# Patient Record
Sex: Female | Born: 1935 | Race: White | Hispanic: No | Smoking: Never smoker
Health system: Southern US, Community
[De-identification: ages and names within clinical notes are randomized; demographics above are authoritative.]

## PROBLEM LIST (undated history)

## (undated) DIAGNOSIS — K859 Acute pancreatitis without necrosis or infection, unspecified: Secondary | ICD-10-CM

## (undated) DIAGNOSIS — G479 Sleep disorder, unspecified: Secondary | ICD-10-CM

## (undated) DIAGNOSIS — K5790 Diverticulosis of intestine, part unspecified, without perforation or abscess without bleeding: Secondary | ICD-10-CM

## (undated) DIAGNOSIS — M549 Dorsalgia, unspecified: Secondary | ICD-10-CM

## (undated) DIAGNOSIS — D509 Iron deficiency anemia, unspecified: Secondary | ICD-10-CM

## (undated) DIAGNOSIS — G8929 Other chronic pain: Secondary | ICD-10-CM

## (undated) DIAGNOSIS — R51 Headache: Secondary | ICD-10-CM

## (undated) DIAGNOSIS — I10 Essential (primary) hypertension: Secondary | ICD-10-CM

## (undated) DIAGNOSIS — F419 Anxiety disorder, unspecified: Secondary | ICD-10-CM

## (undated) DIAGNOSIS — F329 Major depressive disorder, single episode, unspecified: Secondary | ICD-10-CM

## (undated) DIAGNOSIS — E785 Hyperlipidemia, unspecified: Secondary | ICD-10-CM

## (undated) DIAGNOSIS — G4700 Insomnia, unspecified: Secondary | ICD-10-CM

## (undated) DIAGNOSIS — R079 Chest pain, unspecified: Secondary | ICD-10-CM

## (undated) DIAGNOSIS — K219 Gastro-esophageal reflux disease without esophagitis: Secondary | ICD-10-CM

## (undated) DIAGNOSIS — F32A Depression, unspecified: Secondary | ICD-10-CM

## (undated) DIAGNOSIS — Z8619 Personal history of other infectious and parasitic diseases: Secondary | ICD-10-CM

## (undated) DIAGNOSIS — IMO0002 Reserved for concepts with insufficient information to code with codable children: Secondary | ICD-10-CM

## (undated) DIAGNOSIS — M5126 Other intervertebral disc displacement, lumbar region: Secondary | ICD-10-CM

## (undated) DIAGNOSIS — Z8719 Personal history of other diseases of the digestive system: Secondary | ICD-10-CM

## (undated) DIAGNOSIS — D649 Anemia, unspecified: Secondary | ICD-10-CM

## (undated) DIAGNOSIS — E119 Type 2 diabetes mellitus without complications: Secondary | ICD-10-CM

## (undated) DIAGNOSIS — M109 Gout, unspecified: Secondary | ICD-10-CM

## (undated) DIAGNOSIS — R519 Headache, unspecified: Secondary | ICD-10-CM

## (undated) DIAGNOSIS — R197 Diarrhea, unspecified: Secondary | ICD-10-CM

## (undated) DIAGNOSIS — K529 Noninfective gastroenteritis and colitis, unspecified: Secondary | ICD-10-CM

## (undated) HISTORY — DX: Sleep disorder, unspecified: G47.9

## (undated) HISTORY — DX: Essential (primary) hypertension: I10

## (undated) HISTORY — DX: Personal history of other diseases of the digestive system: Z87.19

## (undated) HISTORY — DX: Noninfective gastroenteritis and colitis, unspecified: K52.9

## (undated) HISTORY — DX: Type 2 diabetes mellitus without complications: E11.9

## (undated) HISTORY — DX: Personal history of other infectious and parasitic diseases: Z86.19

## (undated) HISTORY — PX: HERNIA REPAIR: SHX51

## (undated) HISTORY — DX: Anemia, unspecified: D64.9

## (undated) HISTORY — DX: Gout, unspecified: M10.9

## (undated) HISTORY — DX: Headache, unspecified: R51.9

## (undated) HISTORY — DX: Anxiety disorder, unspecified: F41.9

## (undated) HISTORY — DX: Diarrhea, unspecified: R19.7

## (undated) HISTORY — DX: Other chronic pain: G89.29

## (undated) HISTORY — PX: APPENDECTOMY: SHX54

## (undated) HISTORY — DX: Iron deficiency anemia, unspecified: D50.9

## (undated) HISTORY — DX: Other intervertebral disc displacement, lumbar region: M51.26

## (undated) HISTORY — DX: Dorsalgia, unspecified: M54.9

## (undated) HISTORY — PX: CHOLECYSTECTOMY: SHX55

## (undated) HISTORY — DX: Insomnia, unspecified: G47.00

## (undated) HISTORY — DX: Acute pancreatitis without necrosis or infection, unspecified: K85.90

## (undated) HISTORY — PX: TOTAL ABDOMINAL HYSTERECTOMY W/ BILATERAL SALPINGOOPHORECTOMY: SHX83

## (undated) HISTORY — PX: COLONOSCOPY: SHX174

## (undated) HISTORY — DX: Headache: R51

## (undated) HISTORY — DX: Depression, unspecified: F32.A

## (undated) HISTORY — PX: CARDIAC CATHETERIZATION: SHX172

## (undated) HISTORY — DX: Hyperlipidemia, unspecified: E78.5

## (undated) HISTORY — PX: EXPLORATORY LAPAROTOMY: SUR591

## (undated) HISTORY — DX: Gastro-esophageal reflux disease without esophagitis: K21.9

## (undated) HISTORY — DX: Major depressive disorder, single episode, unspecified: F32.9

## (undated) HISTORY — DX: Diverticulosis of intestine, part unspecified, without perforation or abscess without bleeding: K57.90

## (undated) HISTORY — DX: Chest pain, unspecified: R07.9

## (undated) HISTORY — DX: Reserved for concepts with insufficient information to code with codable children: IMO0002

---

## 1975-10-26 HISTORY — PX: KNEE ARTHROSCOPY: SUR90

## 1993-02-06 HISTORY — PX: FLEXIBLE SIGMOIDOSCOPY: SHX1649

## 1996-10-25 HISTORY — PX: ROTATOR CUFF REPAIR: SHX139

## 2005-05-04 ENCOUNTER — Ambulatory Visit: Payer: Self-pay | Admitting: Internal Medicine

## 2005-05-26 ENCOUNTER — Emergency Department: Payer: Self-pay | Admitting: Emergency Medicine

## 2005-05-26 ENCOUNTER — Other Ambulatory Visit: Payer: Self-pay

## 2006-06-16 ENCOUNTER — Ambulatory Visit: Payer: Self-pay | Admitting: Internal Medicine

## 2007-06-20 ENCOUNTER — Ambulatory Visit: Payer: Self-pay | Admitting: Internal Medicine

## 2008-02-19 ENCOUNTER — Other Ambulatory Visit: Payer: Self-pay

## 2008-02-20 ENCOUNTER — Inpatient Hospital Stay: Payer: Self-pay | Admitting: Specialist

## 2008-06-24 ENCOUNTER — Ambulatory Visit: Payer: Self-pay | Admitting: Internal Medicine

## 2009-08-07 ENCOUNTER — Ambulatory Visit: Payer: Self-pay | Admitting: Internal Medicine

## 2009-12-23 ENCOUNTER — Ambulatory Visit: Payer: Self-pay | Admitting: Internal Medicine

## 2009-12-28 ENCOUNTER — Inpatient Hospital Stay: Payer: Self-pay | Admitting: Specialist

## 2010-01-13 ENCOUNTER — Ambulatory Visit: Payer: Self-pay | Admitting: Internal Medicine

## 2010-01-21 ENCOUNTER — Ambulatory Visit: Payer: Self-pay | Admitting: Vascular Surgery

## 2010-01-23 ENCOUNTER — Ambulatory Visit: Payer: Self-pay | Admitting: Internal Medicine

## 2010-02-22 ENCOUNTER — Ambulatory Visit: Payer: Self-pay | Admitting: Internal Medicine

## 2010-03-25 ENCOUNTER — Ambulatory Visit: Payer: Self-pay | Admitting: Internal Medicine

## 2010-04-24 ENCOUNTER — Ambulatory Visit: Payer: Self-pay | Admitting: Internal Medicine

## 2010-05-25 ENCOUNTER — Ambulatory Visit: Payer: Self-pay | Admitting: Internal Medicine

## 2010-06-25 ENCOUNTER — Ambulatory Visit: Payer: Self-pay | Admitting: Internal Medicine

## 2010-07-25 ENCOUNTER — Ambulatory Visit: Payer: Self-pay | Admitting: Internal Medicine

## 2010-08-25 ENCOUNTER — Ambulatory Visit: Payer: Self-pay | Admitting: Internal Medicine

## 2010-09-24 ENCOUNTER — Ambulatory Visit: Payer: Self-pay | Admitting: Internal Medicine

## 2010-09-25 ENCOUNTER — Emergency Department: Payer: Self-pay | Admitting: Unknown Physician Specialty

## 2010-09-26 ENCOUNTER — Ambulatory Visit: Payer: Self-pay | Admitting: Internal Medicine

## 2010-10-25 ENCOUNTER — Ambulatory Visit: Payer: Self-pay | Admitting: Internal Medicine

## 2010-11-25 ENCOUNTER — Ambulatory Visit: Payer: Self-pay | Admitting: Internal Medicine

## 2010-12-30 ENCOUNTER — Ambulatory Visit: Payer: Self-pay | Admitting: Internal Medicine

## 2011-01-24 ENCOUNTER — Ambulatory Visit: Payer: Self-pay | Admitting: Internal Medicine

## 2011-02-23 ENCOUNTER — Ambulatory Visit: Payer: Self-pay | Admitting: Internal Medicine

## 2011-03-26 ENCOUNTER — Ambulatory Visit: Payer: Self-pay | Admitting: Internal Medicine

## 2011-05-05 ENCOUNTER — Ambulatory Visit: Payer: Self-pay | Admitting: Internal Medicine

## 2011-05-31 ENCOUNTER — Ambulatory Visit: Payer: Self-pay | Admitting: Internal Medicine

## 2011-06-16 ENCOUNTER — Ambulatory Visit: Payer: Self-pay | Admitting: Internal Medicine

## 2011-06-26 ENCOUNTER — Ambulatory Visit: Payer: Self-pay | Admitting: Internal Medicine

## 2011-09-10 ENCOUNTER — Ambulatory Visit: Payer: Self-pay | Admitting: Internal Medicine

## 2011-09-22 ENCOUNTER — Inpatient Hospital Stay: Payer: Self-pay | Admitting: Internal Medicine

## 2011-09-25 ENCOUNTER — Ambulatory Visit: Payer: Self-pay | Admitting: Internal Medicine

## 2011-11-10 ENCOUNTER — Ambulatory Visit: Payer: Self-pay | Admitting: Internal Medicine

## 2011-11-26 ENCOUNTER — Ambulatory Visit: Payer: Self-pay | Admitting: Internal Medicine

## 2012-01-04 ENCOUNTER — Ambulatory Visit: Payer: Self-pay | Admitting: Internal Medicine

## 2012-01-24 ENCOUNTER — Ambulatory Visit: Payer: Self-pay | Admitting: Internal Medicine

## 2012-01-25 ENCOUNTER — Ambulatory Visit: Payer: Self-pay | Admitting: Internal Medicine

## 2012-01-25 LAB — COMPREHENSIVE METABOLIC PANEL
Anion Gap: 8 (ref 7–16)
Calcium, Total: 9.3 mg/dL (ref 8.5–10.1)
Chloride: 89 mmol/L — ABNORMAL LOW (ref 98–107)
Co2: 31 mmol/L (ref 21–32)
EGFR (African American): >60
EGFR (Non-African Amer.): 53 — ABNORMAL LOW
Glucose: 132 mg/dL — ABNORMAL HIGH (ref 65–99)
Osmolality: 259 (ref 275–301)
SGOT(AST): 27 U/L (ref 15–37)
SGPT (ALT): 25 U/L
Total Protein: 8.2 g/dL (ref 6.4–8.2)

## 2012-01-25 LAB — CBC CANCER CENTER
HGB: 12.2 g/dL (ref 12.0–16.0)
Lymphocyte %: 22.8 %
MCHC: 34.1 g/dL (ref 32.0–36.0)
MCV: 88 fL (ref 80–100)
Monocyte #: 1.1 x10 3/mm — ABNORMAL HIGH (ref 0.0–0.7)

## 2012-01-25 LAB — HEMOGLOBIN A1C: Hemoglobin A1C: 7.5 % — ABNORMAL HIGH (ref 4.2–6.3)

## 2012-02-11 LAB — IRON AND TIBC
Iron Bind.Cap.(Total): 246 ug/dL — ABNORMAL LOW (ref 250–450)
Iron Saturation: 20 %

## 2012-02-11 LAB — FERRITIN: Ferritin (ARMC): 516 ng/mL — ABNORMAL HIGH (ref 8–388)

## 2012-02-23 ENCOUNTER — Ambulatory Visit: Payer: Self-pay | Admitting: Internal Medicine

## 2012-02-25 ENCOUNTER — Inpatient Hospital Stay: Payer: Self-pay | Admitting: Internal Medicine

## 2012-02-25 LAB — COMPREHENSIVE METABOLIC PANEL
Alkaline Phosphatase: 52 U/L (ref 50–136)
Anion Gap: 9 (ref 7–16)
BUN: 18 mg/dL (ref 7–18)
Bilirubin,Total: 0.2 mg/dL (ref 0.2–1.0)
Calcium, Total: 9 mg/dL (ref 8.5–10.1)
Chloride: 88 mmol/L — ABNORMAL LOW (ref 98–107)
Co2: 29 mmol/L (ref 21–32)
EGFR (African American): >60
Glucose: 158 mg/dL — ABNORMAL HIGH (ref 65–99)
Osmolality: 259 (ref 275–301)
SGPT (ALT): 22 U/L
Total Protein: 8 g/dL (ref 6.4–8.2)

## 2012-02-25 LAB — CBC WITH DIFFERENTIAL/PLATELET
Basophil #: 0 10*3/uL (ref 0.0–0.1)
HCT: 33.2 % — ABNORMAL LOW (ref 35.0–47.0)
HGB: 11.1 g/dL — ABNORMAL LOW (ref 12.0–16.0)
Lymphocyte %: 24.1 %
MCH: 28.8 pg (ref 26.0–34.0)
MCV: 86 fL (ref 80–100)
Neutrophil #: 9.3 10*3/uL — ABNORMAL HIGH (ref 1.4–6.5)
Platelet: 342 10*3/uL (ref 150–440)
RBC: 3.85 10*6/uL (ref 3.80–5.20)
RDW: 12.5 % (ref 11.5–14.5)

## 2012-02-25 LAB — PROTIME-INR: Prothrombin Time: 13 secs (ref 11.5–14.7)

## 2012-02-25 LAB — APTT: Activated PTT: 28.6 secs (ref 23.6–35.9)

## 2012-02-25 LAB — TROPONIN I: Troponin-I: <0.02 ng/mL

## 2012-02-26 LAB — CBC WITH DIFFERENTIAL/PLATELET
Eosinophil %: 2.5 %
HCT: 32.6 % — ABNORMAL LOW (ref 35.0–47.0)
HGB: 10.9 g/dL — ABNORMAL LOW (ref 12.0–16.0)
Lymphocyte #: 3.1 10*3/uL (ref 1.0–3.6)
Lymphocyte %: 28.7 %
MCH: 28.9 pg (ref 26.0–34.0)
MCV: 87 fL (ref 80–100)
Monocyte %: 10.2 %
Neutrophil #: 6.2 10*3/uL (ref 1.4–6.5)
Neutrophil %: 58.3 %
RBC: 3.76 10*6/uL — ABNORMAL LOW (ref 3.80–5.20)
RDW: 12.6 % (ref 11.5–14.5)
WBC: 10.7 10*3/uL (ref 3.6–11.0)

## 2012-02-27 LAB — HEMOGLOBIN: HGB: 10.3 g/dL — ABNORMAL LOW (ref 12.0–16.0)

## 2012-02-28 ENCOUNTER — Ambulatory Visit: Payer: Self-pay | Admitting: Internal Medicine

## 2012-03-25 ENCOUNTER — Ambulatory Visit: Payer: Self-pay | Admitting: Internal Medicine

## 2012-04-24 ENCOUNTER — Ambulatory Visit: Payer: Self-pay | Admitting: Internal Medicine

## 2012-04-28 LAB — IRON AND TIBC
Iron Bind.Cap.(Total): 260 ug/dL (ref 250–450)
Iron: 54 ug/dL (ref 50–170)
Unbound Iron-Bind.Cap.: 206 ug/dL

## 2012-05-25 ENCOUNTER — Ambulatory Visit: Payer: Self-pay | Admitting: Internal Medicine

## 2012-06-25 ENCOUNTER — Ambulatory Visit: Payer: Self-pay | Admitting: Internal Medicine

## 2012-07-25 ENCOUNTER — Ambulatory Visit: Payer: Self-pay | Admitting: Internal Medicine

## 2012-08-25 ENCOUNTER — Ambulatory Visit: Payer: Self-pay | Admitting: Internal Medicine

## 2012-09-08 IMAGING — CR DG CHEST 1V PORT
1 series · 1 of 1 positions shown · non-contrast
Comparison: none

REASON FOR EXAM: weakness
COMMENTS:

[portable]
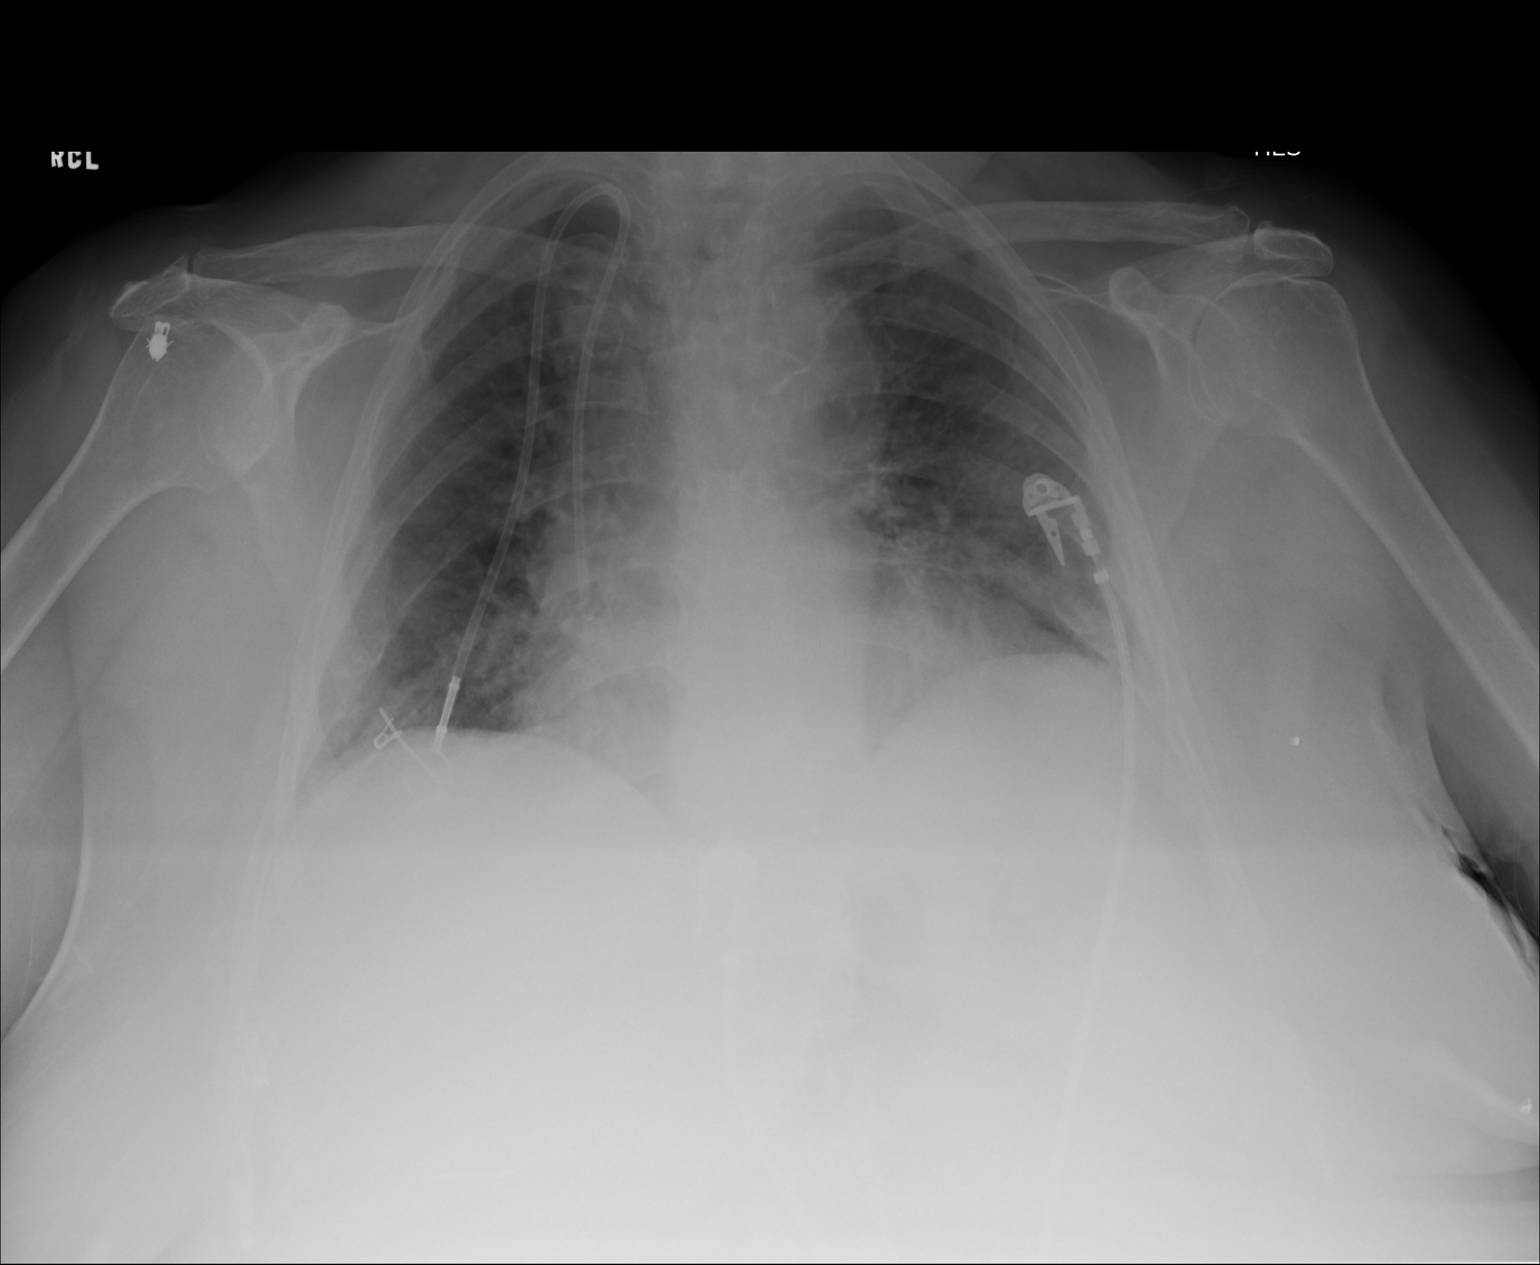

[1 of 1 positions shown; findings below may reference images not displayed]

PROCEDURE:     DXR - DXR PORTABLE CHEST SINGLE VIEW  - February 25, 2012  [DATE]

RESULT:     Comparison is made to the prior exam of 09/21/2011. A
Port-A-Cath is again observed with the tip projected over the inferior
aspect of the superior vena cava. The lung fields are clear. No pulmonary
edema or pleural effusion is seen. Heart size appears stable for the level
of inspiration obtained.
IMPRESSION: 1.     No acute changes are identified.

## 2012-09-24 ENCOUNTER — Ambulatory Visit: Payer: Self-pay | Admitting: Internal Medicine

## 2012-10-25 ENCOUNTER — Ambulatory Visit: Payer: Self-pay | Admitting: Internal Medicine

## 2012-11-01 ENCOUNTER — Ambulatory Visit: Payer: Self-pay | Admitting: Internal Medicine

## 2012-11-17 LAB — COMPREHENSIVE METABOLIC PANEL
Albumin: 3.4 g/dL (ref 3.4–5.0)
BUN: 15 mg/dL (ref 7–18)
Calcium, Total: 8.9 mg/dL (ref 8.5–10.1)
EGFR (Non-African Amer.): 55 — ABNORMAL LOW
Glucose: 143 mg/dL — ABNORMAL HIGH (ref 65–99)
Osmolality: 257 (ref 275–301)
Potassium: 4.1 mmol/L (ref 3.5–5.1)
Total Protein: 8.2 g/dL (ref 6.4–8.2)

## 2012-11-17 LAB — CBC
HGB: 12.2 g/dL (ref 12.0–16.0)
Platelet: 401 10*3/uL (ref 150–440)
RBC: 4.26 10*6/uL (ref 3.80–5.20)
RDW: 13.2 % (ref 11.5–14.5)

## 2012-11-18 ENCOUNTER — Inpatient Hospital Stay: Payer: Self-pay | Admitting: Internal Medicine

## 2012-11-18 LAB — PROTIME-INR: Prothrombin Time: 13.2 secs (ref 11.5–14.7)

## 2012-11-18 LAB — URINALYSIS, COMPLETE
Ketone: NEGATIVE
Ph: 7 (ref 4.5–8.0)
Protein: NEGATIVE
Specific Gravity: 1.003 (ref 1.003–1.030)
WBC UR: 4 /HPF (ref 0–5)

## 2012-11-18 LAB — HEMOGLOBIN
HGB: 10.7 g/dL — ABNORMAL LOW (ref 12.0–16.0)
HGB: 10.9 g/dL — ABNORMAL LOW (ref 12.0–16.0)

## 2012-11-19 LAB — CBC WITH DIFFERENTIAL/PLATELET
Lymphocyte %: 28.9 %
MCH: 28.5 pg (ref 26.0–34.0)
MCHC: 33.9 g/dL (ref 32.0–36.0)
MCV: 84 fL (ref 80–100)
Monocyte #: 1.2 x10 3/mm — ABNORMAL HIGH (ref 0.2–0.9)
Monocyte %: 10.5 %
Neutrophil #: 6.8 10*3/uL — ABNORMAL HIGH (ref 1.4–6.5)
Neutrophil %: 58.5 %
RBC: 3.7 10*6/uL — ABNORMAL LOW (ref 3.80–5.20)
WBC: 11.5 10*3/uL — ABNORMAL HIGH (ref 3.6–11.0)

## 2012-11-19 LAB — BASIC METABOLIC PANEL
Calcium, Total: 8.4 mg/dL — ABNORMAL LOW (ref 8.5–10.1)
Co2: 25 mmol/L (ref 21–32)
EGFR (African American): >60
EGFR (Non-African Amer.): 55 — ABNORMAL LOW
Glucose: 151 mg/dL — ABNORMAL HIGH (ref 65–99)
Osmolality: 266 (ref 275–301)
Sodium: 132 mmol/L — ABNORMAL LOW (ref 136–145)

## 2012-11-20 LAB — CBC WITH DIFFERENTIAL/PLATELET
Basophil #: 0.1 10*3/uL (ref 0.0–0.1)
Eosinophil #: 0.4 10*3/uL (ref 0.0–0.7)
Eosinophil %: 2.7 %
Eosinophil %: 2.5 %
HCT: 29.2 % — ABNORMAL LOW (ref 35.0–47.0)
HGB: 9.7 g/dL — ABNORMAL LOW (ref 12.0–16.0)
Lymphocyte #: 3.3 10*3/uL (ref 1.0–3.6)
Lymphocyte #: 3.3 10*3/uL (ref 1.0–3.6)
Lymphocyte %: 21.2 %
MCH: 28 pg (ref 26.0–34.0)
MCHC: 33 g/dL (ref 32.0–36.0)
Monocyte #: 1.4 x10 3/mm — ABNORMAL HIGH (ref 0.2–0.9)
Monocyte %: 8.3 %
Neutrophil #: 10.4 10*3/uL — ABNORMAL HIGH (ref 1.4–6.5)
Neutrophil #: 9.5 10*3/uL — ABNORMAL HIGH (ref 1.4–6.5)
Neutrophil %: 66.5 %
Platelet: 356 10*3/uL (ref 150–440)
Platelet: 355 10*3/uL (ref 150–440)
RBC: 3.47 10*6/uL — ABNORMAL LOW (ref 3.80–5.20)
RBC: 3.45 10*6/uL — ABNORMAL LOW (ref 3.80–5.20)
RDW: 13.6 % (ref 11.5–14.5)
RDW: 13.2 % (ref 11.5–14.5)

## 2012-11-21 LAB — CBC WITH DIFFERENTIAL/PLATELET
Basophil #: 0.1 10*3/uL (ref 0.0–0.1)
Basophil %: 0.6 %
Basophil %: 0.5 %
Eosinophil #: 0.3 10*3/uL (ref 0.0–0.7)
Eosinophil %: 2.5 %
Eosinophil %: 4 %
HCT: 25.6 % — ABNORMAL LOW (ref 35.0–47.0)
HGB: 8.5 g/dL — ABNORMAL LOW (ref 12.0–16.0)
Lymphocyte #: 2.8 10*3/uL (ref 1.0–3.6)
Lymphocyte %: 20.2 %
Lymphocyte %: 27 %
MCH: 29.2 pg (ref 26.0–34.0)
MCH: 28.3 pg (ref 26.0–34.0)
MCHC: 33.5 g/dL (ref 32.0–36.0)
MCV: 84 fL (ref 80–100)
MCV: 84 fL (ref 80–100)
Monocyte #: 1 x10 3/mm — ABNORMAL HIGH (ref 0.2–0.9)
Monocyte %: 8.3 %
Monocyte %: 9.7 %
Neutrophil #: 7.9 10*3/uL — ABNORMAL HIGH (ref 1.4–6.5)
Neutrophil #: 6.2 10*3/uL (ref 1.4–6.5)
Neutrophil %: 68.4 %
Neutrophil %: 58.8 %
RBC: 3.04 10*6/uL — ABNORMAL LOW (ref 3.80–5.20)
RDW: 13.4 % (ref 11.5–14.5)
RDW: 13.5 % (ref 11.5–14.5)
WBC: 10.5 10*3/uL (ref 3.6–11.0)

## 2012-11-22 LAB — POTASSIUM: Potassium: 3.2 mmol/L — ABNORMAL LOW (ref 3.5–5.1)

## 2012-11-22 LAB — MAGNESIUM: Magnesium: 0.6 mg/dL — ABNORMAL LOW

## 2012-11-24 LAB — MAGNESIUM: Magnesium: 1.5 mg/dL — ABNORMAL LOW

## 2012-11-24 LAB — POTASSIUM: Potassium: 3.7 mmol/L (ref 3.5–5.1)

## 2012-11-24 LAB — PHOSPHORUS: Phosphorus: 2.7 mg/dL (ref 2.5–4.9)

## 2012-11-25 LAB — URIC ACID: Uric Acid: 4.4 mg/dL (ref 2.6–6.0)

## 2012-11-25 LAB — CALCIUM: Calcium, Total: 8.8 mg/dL (ref 8.5–10.1)

## 2012-11-25 LAB — PHOSPHORUS: Phosphorus: 2.3 mg/dL — ABNORMAL LOW (ref 2.5–4.9)

## 2012-11-26 LAB — POTASSIUM: Potassium: 3.9 mmol/L (ref 3.5–5.1)

## 2012-11-26 LAB — CALCIUM: Calcium, Total: 8.1 mg/dL — ABNORMAL LOW (ref 8.5–10.1)

## 2012-11-27 LAB — ALBUMIN: Albumin: 2.7 g/dL — ABNORMAL LOW (ref 3.4–5.0)

## 2012-11-27 LAB — PHOSPHORUS: Phosphorus: 3.4 mg/dL (ref 2.5–4.9)

## 2012-11-27 LAB — MAGNESIUM: Magnesium: 1.7 mg/dL — ABNORMAL LOW

## 2012-11-27 LAB — CLOSTRIDIUM DIFFICILE BY PCR

## 2012-11-28 LAB — MAGNESIUM: Magnesium: 1.9 mg/dL

## 2012-11-28 LAB — POTASSIUM: Potassium: 4.2 mmol/L (ref 3.5–5.1)

## 2012-11-28 LAB — PHOSPHORUS: Phosphorus: 3.4 mg/dL (ref 2.5–4.9)

## 2012-11-29 ENCOUNTER — Ambulatory Visit: Payer: Self-pay | Admitting: Neurology

## 2012-11-29 LAB — LIPID PANEL
Cholesterol: 106 mg/dL (ref 0–200)
Ldl Cholesterol, Calc: 50 mg/dL (ref 0–100)
Triglycerides: 144 mg/dL (ref 0–200)
VLDL Cholesterol, Calc: 29 mg/dL (ref 5–40)

## 2012-11-29 LAB — BASIC METABOLIC PANEL
Anion Gap: 7 (ref 7–16)
Co2: 26 mmol/L (ref 21–32)
Creatinine: 0.93 mg/dL (ref 0.60–1.30)
EGFR (African American): >60
EGFR (Non-African Amer.): 58 — ABNORMAL LOW
Glucose: 189 mg/dL — ABNORMAL HIGH (ref 65–99)
Potassium: 4.3 mmol/L (ref 3.5–5.1)
Sodium: 130 mmol/L — ABNORMAL LOW (ref 136–145)

## 2012-11-29 LAB — STOOL CULTURE

## 2012-11-29 LAB — MAGNESIUM: Magnesium: 1.7 mg/dL — ABNORMAL LOW

## 2012-11-30 DIAGNOSIS — I6789 Other cerebrovascular disease: Secondary | ICD-10-CM

## 2012-11-30 LAB — MAGNESIUM: Magnesium: 1.8 mg/dL

## 2012-12-01 LAB — BASIC METABOLIC PANEL
Anion Gap: 6 — ABNORMAL LOW (ref 7–16)
Calcium, Total: 8.6 mg/dL (ref 8.5–10.1)
Chloride: 102 mmol/L (ref 98–107)
Co2: 27 mmol/L (ref 21–32)
EGFR (African American): 57 — ABNORMAL LOW
EGFR (Non-African Amer.): 49 — ABNORMAL LOW
Osmolality: 272 (ref 275–301)
Potassium: 4.9 mmol/L (ref 3.5–5.1)
Sodium: 135 mmol/L — ABNORMAL LOW (ref 136–145)

## 2012-12-02 ENCOUNTER — Ambulatory Visit: Payer: Self-pay | Admitting: Internal Medicine

## 2012-12-02 ENCOUNTER — Encounter: Payer: Self-pay | Admitting: Internal Medicine

## 2012-12-29 ENCOUNTER — Ambulatory Visit: Payer: Self-pay | Admitting: Internal Medicine

## 2012-12-31 ENCOUNTER — Encounter: Payer: Self-pay | Admitting: Internal Medicine

## 2013-01-23 ENCOUNTER — Ambulatory Visit: Payer: Self-pay | Admitting: Internal Medicine

## 2013-01-29 LAB — IRON AND TIBC
Iron Saturation: 19 %
Unbound Iron-Bind.Cap.: 202 ug/dL

## 2013-01-29 LAB — CBC CANCER CENTER
Basophil #: 0 x10 3/mm (ref 0.0–0.1)
Basophil %: 0.4 %
Eosinophil %: 2.1 %
HCT: 33.9 % — ABNORMAL LOW (ref 35.0–47.0)
HGB: 10.7 g/dL — ABNORMAL LOW (ref 12.0–16.0)
Lymphocyte #: 3.5 x10 3/mm (ref 1.0–3.6)
Lymphocyte %: 31 %
MCH: 26.2 pg (ref 26.0–34.0)
MCHC: 31.6 g/dL — ABNORMAL LOW (ref 32.0–36.0)
MCV: 83 fL (ref 80–100)
Monocyte %: 7.5 %
Neutrophil %: 59 %
RBC: 4.08 10*6/uL (ref 3.80–5.20)
RDW: 14.2 % (ref 11.5–14.5)

## 2013-01-29 LAB — FERRITIN: Ferritin (ARMC): 167 ng/mL (ref 8–388)

## 2013-02-22 ENCOUNTER — Ambulatory Visit: Payer: Self-pay | Admitting: Internal Medicine

## 2013-03-25 ENCOUNTER — Ambulatory Visit: Payer: Self-pay | Admitting: Internal Medicine

## 2013-04-18 LAB — CANCER CENTER HEMOGLOBIN: HGB: 11.8 g/dL — ABNORMAL LOW (ref 12.0–16.0)

## 2013-04-18 LAB — IRON AND TIBC
Iron Bind.Cap.(Total): 264 ug/dL (ref 250–450)
Iron: 49 ug/dL — ABNORMAL LOW (ref 50–170)
Unbound Iron-Bind.Cap.: 215 ug/dL

## 2013-04-24 ENCOUNTER — Ambulatory Visit: Payer: Self-pay | Admitting: Internal Medicine

## 2013-05-25 ENCOUNTER — Ambulatory Visit: Payer: Self-pay | Admitting: Internal Medicine

## 2013-06-25 ENCOUNTER — Ambulatory Visit: Payer: Self-pay | Admitting: Internal Medicine

## 2013-07-16 LAB — CANCER CENTER HEMOGLOBIN: HGB: 11.9 g/dL — ABNORMAL LOW (ref 12.0–16.0)

## 2013-07-16 LAB — IRON AND TIBC: Iron Bind.Cap.(Total): 250 ug/dL (ref 250–450)

## 2013-07-16 LAB — FERRITIN: Ferritin (ARMC): 214 ng/mL (ref 8–388)

## 2013-07-25 ENCOUNTER — Ambulatory Visit: Payer: Self-pay | Admitting: Internal Medicine

## 2013-08-30 ENCOUNTER — Ambulatory Visit: Payer: Self-pay | Admitting: Internal Medicine

## 2013-09-24 ENCOUNTER — Ambulatory Visit: Payer: Self-pay | Admitting: Internal Medicine

## 2013-10-08 LAB — CANCER CENTER HEMOGLOBIN: HGB: 11.4 g/dL — ABNORMAL LOW (ref 12.0–16.0)

## 2013-10-08 LAB — IRON AND TIBC
Iron Bind.Cap.(Total): 277 ug/dL (ref 250–450)
Iron Saturation: 19 %
Unbound Iron-Bind.Cap.: 225 ug/dL

## 2013-10-08 LAB — FERRITIN: Ferritin (ARMC): 252 ng/mL (ref 8–388)

## 2013-10-25 ENCOUNTER — Ambulatory Visit: Payer: Self-pay | Admitting: Internal Medicine

## 2013-11-25 ENCOUNTER — Ambulatory Visit: Payer: Self-pay | Admitting: Internal Medicine

## 2013-12-28 ENCOUNTER — Ambulatory Visit: Payer: Self-pay | Admitting: Internal Medicine

## 2013-12-31 LAB — CBC CANCER CENTER
BASOS ABS: 0.1 x10 3/mm (ref 0.0–0.1)
Basophil %: 0.4 %
Eosinophil #: 0.2 x10 3/mm (ref 0.0–0.7)
Eosinophil %: 1.7 %
HCT: 34.4 % — AB (ref 35.0–47.0)
HGB: 11.4 g/dL — AB (ref 12.0–16.0)
LYMPHS ABS: 3.5 x10 3/mm (ref 1.0–3.6)
Lymphocyte %: 28.1 %
MCH: 28 pg (ref 26.0–34.0)
MCHC: 33 g/dL (ref 32.0–36.0)
MCV: 85 fL (ref 80–100)
MONO ABS: 1 x10 3/mm — AB (ref 0.2–0.9)
Monocyte %: 7.9 %
NEUTROS ABS: 7.8 x10 3/mm — AB (ref 1.4–6.5)
NEUTROS PCT: 61.9 %
PLATELETS: 397 x10 3/mm (ref 150–440)
RBC: 4.06 10*6/uL (ref 3.80–5.20)
RDW: 13.1 % (ref 11.5–14.5)
WBC: 12.6 x10 3/mm — AB (ref 3.6–11.0)

## 2013-12-31 LAB — FERRITIN: Ferritin (ARMC): 234 ng/mL (ref 8–388)

## 2013-12-31 LAB — IRON AND TIBC
Iron Bind.Cap.(Total): 278 ug/dL (ref 250–450)
Iron Saturation: 19 %
Iron: 52 ug/dL (ref 50–170)
Unbound Iron-Bind.Cap.: 226 ug/dL

## 2014-01-23 ENCOUNTER — Ambulatory Visit: Payer: Self-pay | Admitting: Internal Medicine

## 2014-01-31 ENCOUNTER — Observation Stay: Payer: Self-pay | Admitting: Internal Medicine

## 2014-01-31 LAB — CBC WITH DIFFERENTIAL/PLATELET
BASOS ABS: 0 10*3/uL (ref 0.0–0.1)
Basophil %: 0.2 %
Eosinophil #: 0 10*3/uL (ref 0.0–0.7)
Eosinophil %: 0 %
HCT: 38.8 % (ref 35.0–47.0)
HGB: 12.5 g/dL (ref 12.0–16.0)
LYMPHS ABS: 0.7 10*3/uL — AB (ref 1.0–3.6)
Lymphocyte %: 4.4 %
MCH: 27.7 pg (ref 26.0–34.0)
MCHC: 32.2 g/dL (ref 32.0–36.0)
MCV: 86 fL (ref 80–100)
MONO ABS: 0.5 x10 3/mm (ref 0.2–0.9)
MONOS PCT: 2.9 %
Neutrophil #: 15.7 10*3/uL — ABNORMAL HIGH (ref 1.4–6.5)
Neutrophil %: 92.5 %
PLATELETS: 397 10*3/uL (ref 150–440)
RBC: 4.51 10*6/uL (ref 3.80–5.20)
RDW: 13.3 % (ref 11.5–14.5)
WBC: 16.9 10*3/uL — ABNORMAL HIGH (ref 3.6–11.0)

## 2014-01-31 LAB — COMPREHENSIVE METABOLIC PANEL
ALBUMIN: 3.6 g/dL (ref 3.4–5.0)
ANION GAP: 7 (ref 7–16)
AST: 28 U/L (ref 15–37)
Alkaline Phosphatase: 75 U/L
BUN: 31 mg/dL — AB (ref 7–18)
Bilirubin,Total: 0.2 mg/dL (ref 0.2–1.0)
CALCIUM: 9.3 mg/dL (ref 8.5–10.1)
CO2: 27 mmol/L (ref 21–32)
Chloride: 93 mmol/L — ABNORMAL LOW (ref 98–107)
Creatinine: 1.41 mg/dL — ABNORMAL HIGH (ref 0.60–1.30)
EGFR (Non-African Amer.): 34 — ABNORMAL LOW
GFR CALC AF AMER: 40 — AB
GLUCOSE: 167 mg/dL — AB (ref 65–99)
OSMOLALITY: 266 (ref 275–301)
POTASSIUM: 4.1 mmol/L (ref 3.5–5.1)
SGPT (ALT): 18 U/L (ref 12–78)
SODIUM: 127 mmol/L — AB (ref 136–145)
Total Protein: 8.7 g/dL — ABNORMAL HIGH (ref 6.4–8.2)

## 2014-01-31 LAB — URINALYSIS, COMPLETE
Bilirubin,UR: NEGATIVE
Glucose,UR: 150 mg/dL (ref 0–75)
KETONE: NEGATIVE
Leukocyte Esterase: NEGATIVE
Nitrite: POSITIVE
PH: 5 (ref 4.5–8.0)
SPECIFIC GRAVITY: 1.012 (ref 1.003–1.030)
Squamous Epithelial: NONE SEEN
WBC UR: 2 /HPF (ref 0–5)

## 2014-02-01 LAB — BASIC METABOLIC PANEL
Anion Gap: 10 (ref 7–16)
BUN: 18 mg/dL (ref 7–18)
CHLORIDE: 97 mmol/L — AB (ref 98–107)
CO2: 25 mmol/L (ref 21–32)
Calcium, Total: 8.8 mg/dL (ref 8.5–10.1)
Creatinine: 0.96 mg/dL (ref 0.60–1.30)
EGFR (African American): >60
EGFR (Non-African Amer.): 55 — ABNORMAL LOW
Glucose: 110 mg/dL — ABNORMAL HIGH (ref 65–99)
Osmolality: 267 (ref 275–301)
Potassium: 4.6 mmol/L (ref 3.5–5.1)
SODIUM: 132 mmol/L — AB (ref 136–145)

## 2014-02-02 LAB — URINE CULTURE

## 2014-03-07 ENCOUNTER — Ambulatory Visit: Payer: Self-pay | Admitting: Internal Medicine

## 2014-03-25 ENCOUNTER — Ambulatory Visit: Payer: Self-pay | Admitting: Internal Medicine

## 2014-03-25 LAB — CANCER CENTER HEMOGLOBIN: HGB: 11.7 g/dL — ABNORMAL LOW (ref 12.0–16.0)

## 2014-03-25 LAB — FERRITIN: Ferritin (ARMC): 299 ng/mL (ref 8–388)

## 2014-03-25 LAB — IRON AND TIBC
Iron Bind.Cap.(Total): 269 ug/dL (ref 250–450)
Iron Saturation: 16 %
Iron: 43 ug/dL — ABNORMAL LOW (ref 50–170)
Unbound Iron-Bind.Cap.: 226 ug/dL

## 2014-04-24 ENCOUNTER — Ambulatory Visit: Payer: Self-pay | Admitting: Internal Medicine

## 2014-05-17 ENCOUNTER — Inpatient Hospital Stay: Payer: Self-pay | Admitting: Internal Medicine

## 2014-05-17 LAB — URINALYSIS, COMPLETE
Bilirubin,UR: NEGATIVE
Glucose,UR: NEGATIVE mg/dL (ref 0–75)
Ketone: NEGATIVE
NITRITE: NEGATIVE
PH: 5 (ref 4.5–8.0)
RBC,UR: 128 /HPF (ref 0–5)
Specific Gravity: 1.013 (ref 1.003–1.030)
Squamous Epithelial: 4
WBC UR: 2632 /HPF (ref 0–5)

## 2014-05-17 LAB — COMPREHENSIVE METABOLIC PANEL
ALBUMIN: 2.2 g/dL — AB (ref 3.4–5.0)
AST: 117 U/L — AB (ref 15–37)
Alkaline Phosphatase: 64 U/L
Anion Gap: 13 (ref 7–16)
BILIRUBIN TOTAL: 0.3 mg/dL (ref 0.2–1.0)
BUN: 53 mg/dL — ABNORMAL HIGH (ref 7–18)
CALCIUM: 8.5 mg/dL (ref 8.5–10.1)
CHLORIDE: 86 mmol/L — AB (ref 98–107)
Co2: 22 mmol/L (ref 21–32)
Creatinine: 2.85 mg/dL — ABNORMAL HIGH (ref 0.60–1.30)
EGFR (African American): 17 — ABNORMAL LOW
EGFR (Non-African Amer.): 15 — ABNORMAL LOW
Glucose: 196 mg/dL — ABNORMAL HIGH (ref 65–99)
OSMOLALITY: 264 (ref 275–301)
Potassium: 3.8 mmol/L (ref 3.5–5.1)
SGPT (ALT): 29 U/L
Sodium: 121 mmol/L — ABNORMAL LOW (ref 136–145)
Total Protein: 6.7 g/dL (ref 6.4–8.2)

## 2014-05-17 LAB — CBC WITH DIFFERENTIAL/PLATELET
Basophil #: 0 10*3/uL (ref 0.0–0.1)
Basophil %: 0.1 %
Eosinophil #: 0 10*3/uL (ref 0.0–0.7)
Eosinophil %: 0.3 %
HCT: 32.7 % — ABNORMAL LOW (ref 35.0–47.0)
HGB: 11 g/dL — AB (ref 12.0–16.0)
LYMPHS ABS: 0.6 10*3/uL — AB (ref 1.0–3.6)
LYMPHS PCT: 3.7 %
MCH: 28.4 pg (ref 26.0–34.0)
MCHC: 33.8 g/dL (ref 32.0–36.0)
MCV: 84 fL (ref 80–100)
Monocyte #: 1.4 x10 3/mm — ABNORMAL HIGH (ref 0.2–0.9)
Monocyte %: 8 %
NEUTROS ABS: 15.4 10*3/uL — AB (ref 1.4–6.5)
Neutrophil %: 87.9 %
PLATELETS: 264 10*3/uL (ref 150–440)
RBC: 3.89 10*6/uL (ref 3.80–5.20)
RDW: 14.4 % (ref 11.5–14.5)
WBC: 17.5 10*3/uL — ABNORMAL HIGH (ref 3.6–11.0)

## 2014-05-17 LAB — LIPASE, BLOOD: LIPASE: 72 U/L — AB (ref 73–393)

## 2014-05-17 LAB — HEPATIC FUNCTION PANEL A (ARMC)
Albumin: 2.2 g/dL — ABNORMAL LOW (ref 3.4–5.0)
Alkaline Phosphatase: 65 U/L
Bilirubin,Total: 0.3 mg/dL (ref 0.2–1.0)
SGOT(AST): 116 U/L — ABNORMAL HIGH (ref 15–37)
SGPT (ALT): 29 U/L
Total Protein: 6.7 g/dL (ref 6.4–8.2)

## 2014-05-17 LAB — TROPONIN I: Troponin-I: 0.02 ng/mL

## 2014-05-18 LAB — CBC WITH DIFFERENTIAL/PLATELET
Basophil #: 0 10*3/uL (ref 0.0–0.1)
Basophil %: 0.2 %
EOS PCT: 0.8 %
Eosinophil #: 0.1 10*3/uL (ref 0.0–0.7)
HCT: 31.1 % — AB (ref 35.0–47.0)
HGB: 10.3 g/dL — ABNORMAL LOW (ref 12.0–16.0)
LYMPHS ABS: 1.7 10*3/uL (ref 1.0–3.6)
Lymphocyte %: 10.9 %
MCH: 28.3 pg (ref 26.0–34.0)
MCHC: 33.2 g/dL (ref 32.0–36.0)
MCV: 86 fL (ref 80–100)
Monocyte #: 1.3 x10 3/mm — ABNORMAL HIGH (ref 0.2–0.9)
Monocyte %: 8.6 %
Neutrophil #: 12.1 10*3/uL — ABNORMAL HIGH (ref 1.4–6.5)
Neutrophil %: 79.5 %
PLATELETS: 248 10*3/uL (ref 150–440)
RBC: 3.64 10*6/uL — ABNORMAL LOW (ref 3.80–5.20)
RDW: 14.5 % (ref 11.5–14.5)
WBC: 15.3 10*3/uL — ABNORMAL HIGH (ref 3.6–11.0)

## 2014-05-18 LAB — BASIC METABOLIC PANEL
Anion Gap: 12 (ref 7–16)
BUN: 50 mg/dL — AB (ref 7–18)
Calcium, Total: 8 mg/dL — ABNORMAL LOW (ref 8.5–10.1)
Chloride: 90 mmol/L — ABNORMAL LOW (ref 98–107)
Co2: 23 mmol/L (ref 21–32)
Creatinine: 2.32 mg/dL — ABNORMAL HIGH (ref 0.60–1.30)
EGFR (African American): 22 — ABNORMAL LOW
GFR CALC NON AF AMER: 19 — AB
GLUCOSE: 94 mg/dL (ref 65–99)
Osmolality: 265 (ref 275–301)
Potassium: 3.6 mmol/L (ref 3.5–5.1)
SODIUM: 125 mmol/L — AB (ref 136–145)

## 2014-05-19 LAB — OCCULT BLOOD X 1 CARD TO LAB, STOOL: Occult Blood, Feces: NEGATIVE

## 2014-05-19 LAB — BASIC METABOLIC PANEL
Anion Gap: 7 (ref 7–16)
BUN: 38 mg/dL — ABNORMAL HIGH (ref 7–18)
CALCIUM: 7.9 mg/dL — AB (ref 8.5–10.1)
CREATININE: 1.66 mg/dL — AB (ref 0.60–1.30)
Chloride: 98 mmol/L (ref 98–107)
Co2: 25 mmol/L (ref 21–32)
EGFR (Non-African Amer.): 28 — ABNORMAL LOW
GFR CALC AF AMER: 33 — AB
Glucose: 84 mg/dL (ref 65–99)
Osmolality: 269 (ref 275–301)
Potassium: 3.8 mmol/L (ref 3.5–5.1)
SODIUM: 130 mmol/L — AB (ref 136–145)

## 2014-05-20 LAB — BASIC METABOLIC PANEL
ANION GAP: 9 (ref 7–16)
BUN: 23 mg/dL — AB (ref 7–18)
Calcium, Total: 8 mg/dL — ABNORMAL LOW (ref 8.5–10.1)
Chloride: 102 mmol/L (ref 98–107)
Co2: 23 mmol/L (ref 21–32)
Creatinine: 1.12 mg/dL (ref 0.60–1.30)
EGFR (Non-African Amer.): 45 — ABNORMAL LOW
GFR CALC AF AMER: 53 — AB
GLUCOSE: 90 mg/dL (ref 65–99)
Osmolality: 271 (ref 275–301)
POTASSIUM: 3.5 mmol/L (ref 3.5–5.1)
SODIUM: 134 mmol/L — AB (ref 136–145)

## 2014-05-21 ENCOUNTER — Encounter: Payer: Self-pay | Admitting: Internal Medicine

## 2014-05-21 LAB — URINE CULTURE

## 2014-05-22 LAB — CULTURE, BLOOD (SINGLE)

## 2014-05-25 ENCOUNTER — Encounter: Payer: Self-pay | Admitting: Internal Medicine

## 2014-06-07 ENCOUNTER — Ambulatory Visit: Payer: Self-pay | Admitting: Gerontology

## 2014-06-14 ENCOUNTER — Ambulatory Visit: Payer: Self-pay | Admitting: Internal Medicine

## 2014-07-04 ENCOUNTER — Ambulatory Visit: Payer: Self-pay | Admitting: Internal Medicine

## 2014-07-04 LAB — FERRITIN: Ferritin (ARMC): 1122 ng/mL — ABNORMAL HIGH (ref 8–388)

## 2014-07-04 LAB — IRON AND TIBC
Iron Bind.Cap.(Total): 191 ug/dL — ABNORMAL LOW (ref 250–450)
Iron Saturation: 38 %
Iron: 73 ug/dL (ref 50–170)
Unbound Iron-Bind.Cap.: 118 ug/dL

## 2014-07-04 LAB — CANCER CENTER HEMOGLOBIN: HGB: 11.8 g/dL — ABNORMAL LOW (ref 12.0–16.0)

## 2014-07-25 ENCOUNTER — Ambulatory Visit: Payer: Self-pay | Admitting: Internal Medicine

## 2014-08-30 ENCOUNTER — Ambulatory Visit: Payer: Self-pay | Admitting: Internal Medicine

## 2014-09-09 LAB — IRON AND TIBC
IRON: 66 ug/dL (ref 50–170)
Iron Bind.Cap.(Total): 239 ug/dL — ABNORMAL LOW (ref 250–450)
Iron Saturation: 28 %
Unbound Iron-Bind.Cap.: 173 ug/dL

## 2014-09-09 LAB — FERRITIN: Ferritin (ARMC): 750 ng/mL — ABNORMAL HIGH (ref 8–388)

## 2014-09-09 LAB — CANCER CENTER HEMOGLOBIN: HGB: 11.9 g/dL — AB (ref 12.0–16.0)

## 2014-09-12 DIAGNOSIS — E7800 Pure hypercholesterolemia, unspecified: Secondary | ICD-10-CM | POA: Insufficient documentation

## 2014-09-24 ENCOUNTER — Ambulatory Visit: Payer: Self-pay | Admitting: Internal Medicine

## 2014-10-25 ENCOUNTER — Ambulatory Visit: Payer: Self-pay | Admitting: Internal Medicine

## 2014-12-02 ENCOUNTER — Ambulatory Visit: Payer: Self-pay | Admitting: Internal Medicine

## 2014-12-02 LAB — IRON AND TIBC
IRON SATURATION: 23 %
Iron Bind.Cap.(Total): 262 ug/dL (ref 250–450)
Iron: 60 ug/dL (ref 50–170)
UNBOUND IRON-BIND. CAP.: 202 ug/dL

## 2014-12-02 LAB — CBC CANCER CENTER
BASOS ABS: 0.1 x10 3/mm (ref 0.0–0.1)
Basophil %: 0.6 %
EOS ABS: 0.1 x10 3/mm (ref 0.0–0.7)
Eosinophil %: 1.1 %
HCT: 32.5 % — AB (ref 35.0–47.0)
HGB: 10.9 g/dL — ABNORMAL LOW (ref 12.0–16.0)
LYMPHS PCT: 29.2 %
Lymphocyte #: 3.4 x10 3/mm (ref 1.0–3.6)
MCH: 28.3 pg (ref 26.0–34.0)
MCHC: 33.7 g/dL (ref 32.0–36.0)
MCV: 84 fL (ref 80–100)
Monocyte #: 1.1 x10 3/mm — ABNORMAL HIGH (ref 0.2–0.9)
Monocyte %: 9.5 %
NEUTROS PCT: 59.6 %
Neutrophil #: 7 x10 3/mm — ABNORMAL HIGH (ref 1.4–6.5)
Platelet: 419 x10 3/mm (ref 150–440)
RBC: 3.87 10*6/uL (ref 3.80–5.20)
RDW: 13.6 % (ref 11.5–14.5)
WBC: 11.8 x10 3/mm — AB (ref 3.6–11.0)

## 2014-12-02 LAB — FERRITIN: Ferritin (ARMC): 505 ng/mL — ABNORMAL HIGH (ref 8–388)

## 2014-12-20 IMAGING — CR DG KNEE COMPLETE 4+V*L*
1 series · 4 of 4 positions shown · non-contrast
Comparison: None.

CLINICAL DATA: Left knee pain.

EXAM:
LEFT KNEE - COMPLETE 4+ VIEW

[Series 1: x knee ap left · 0.14mm/px · 4 of 4 slices shown]
[im 1/4]
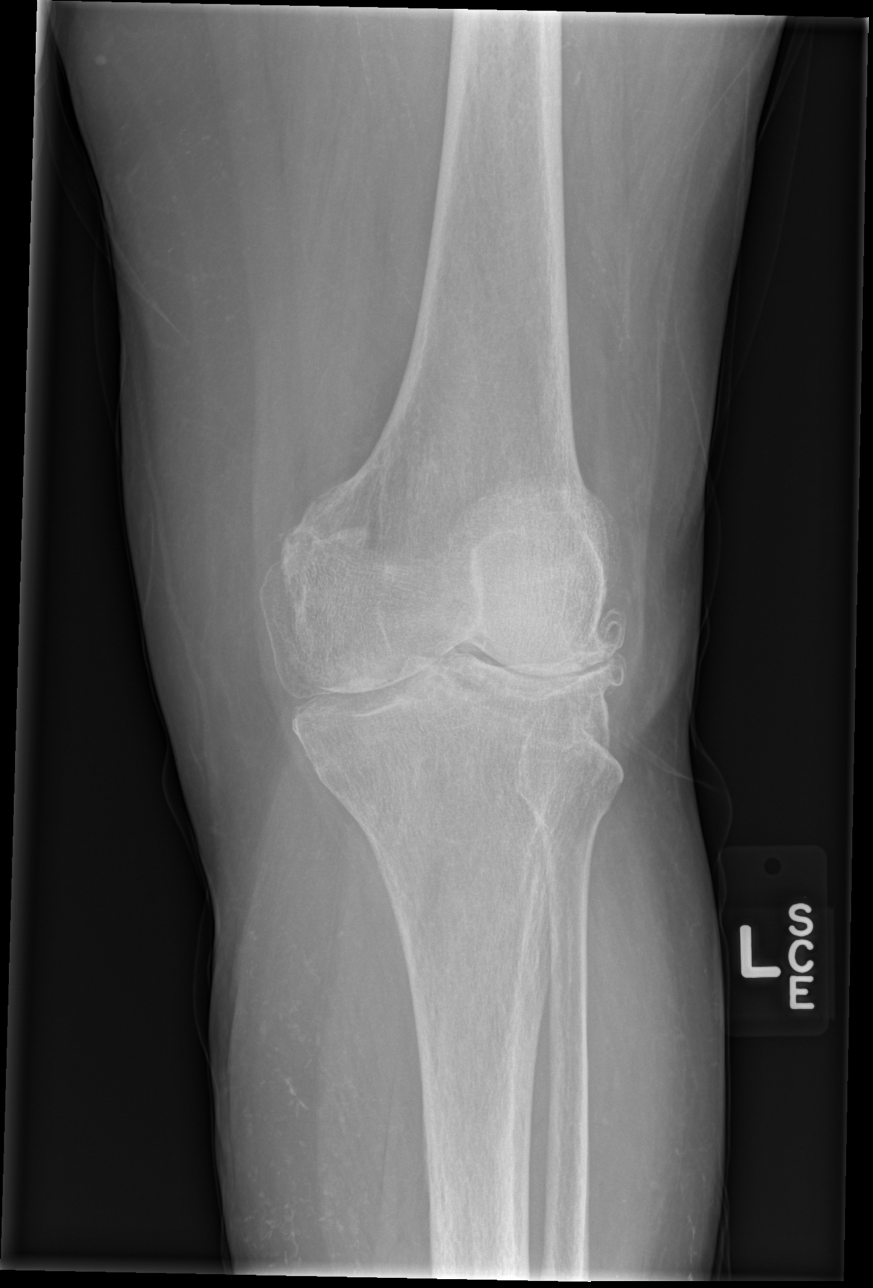
[im 2/4]
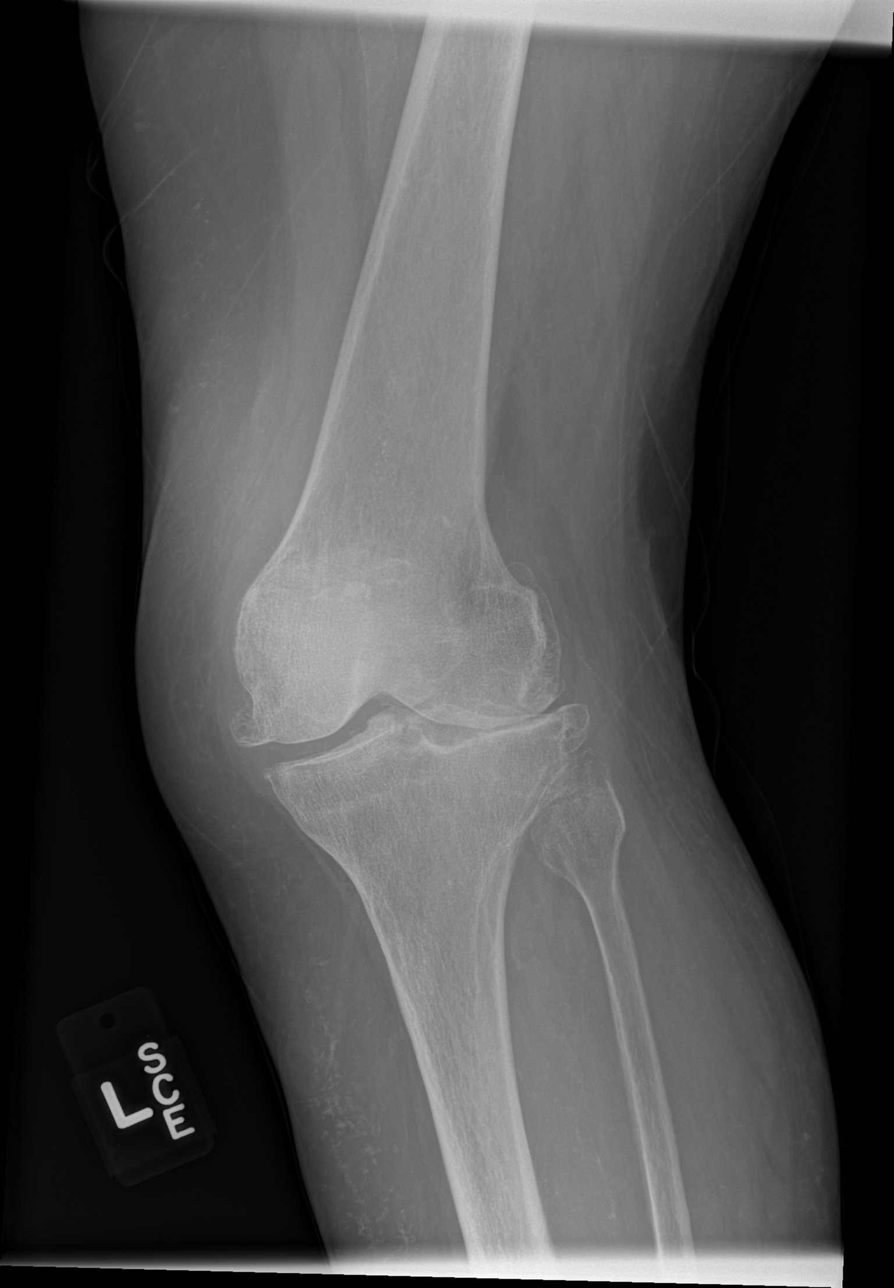
[im 3/4]
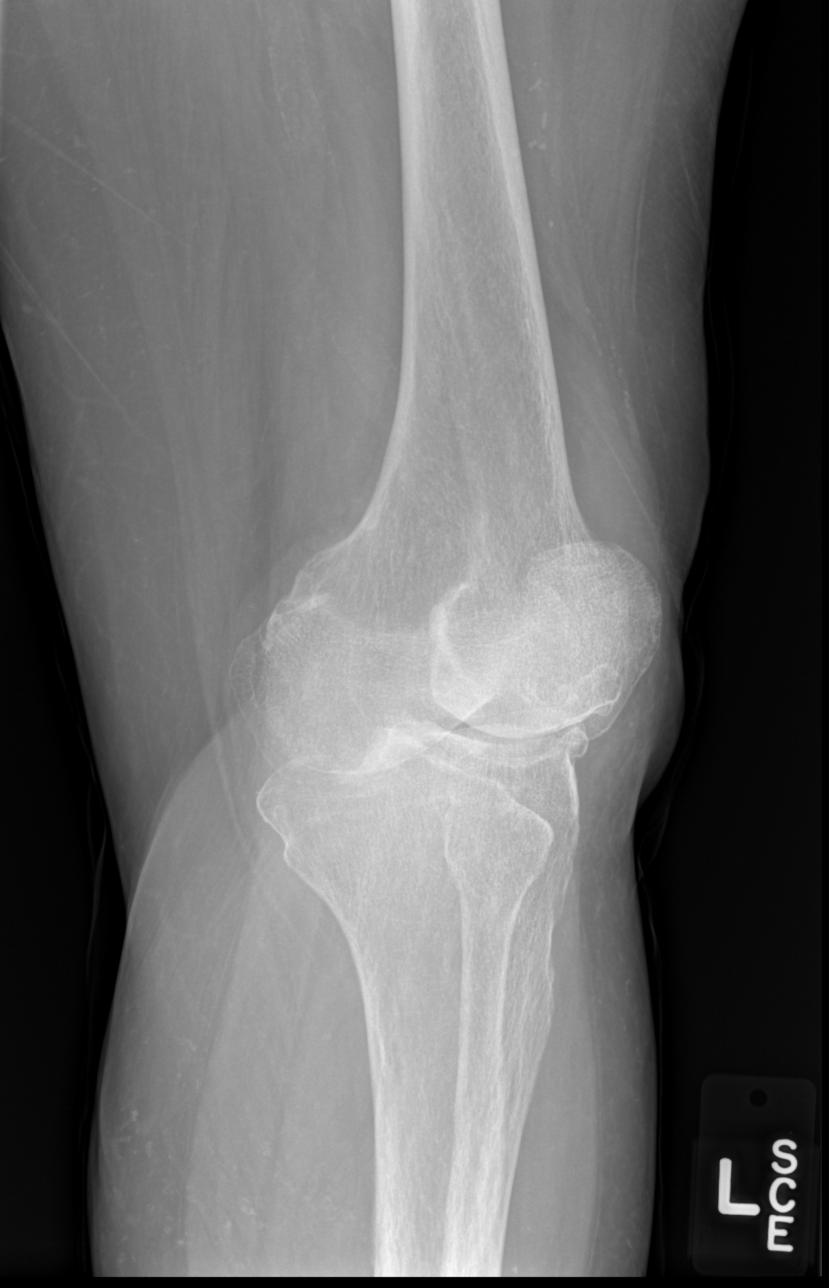
[im 4/4]
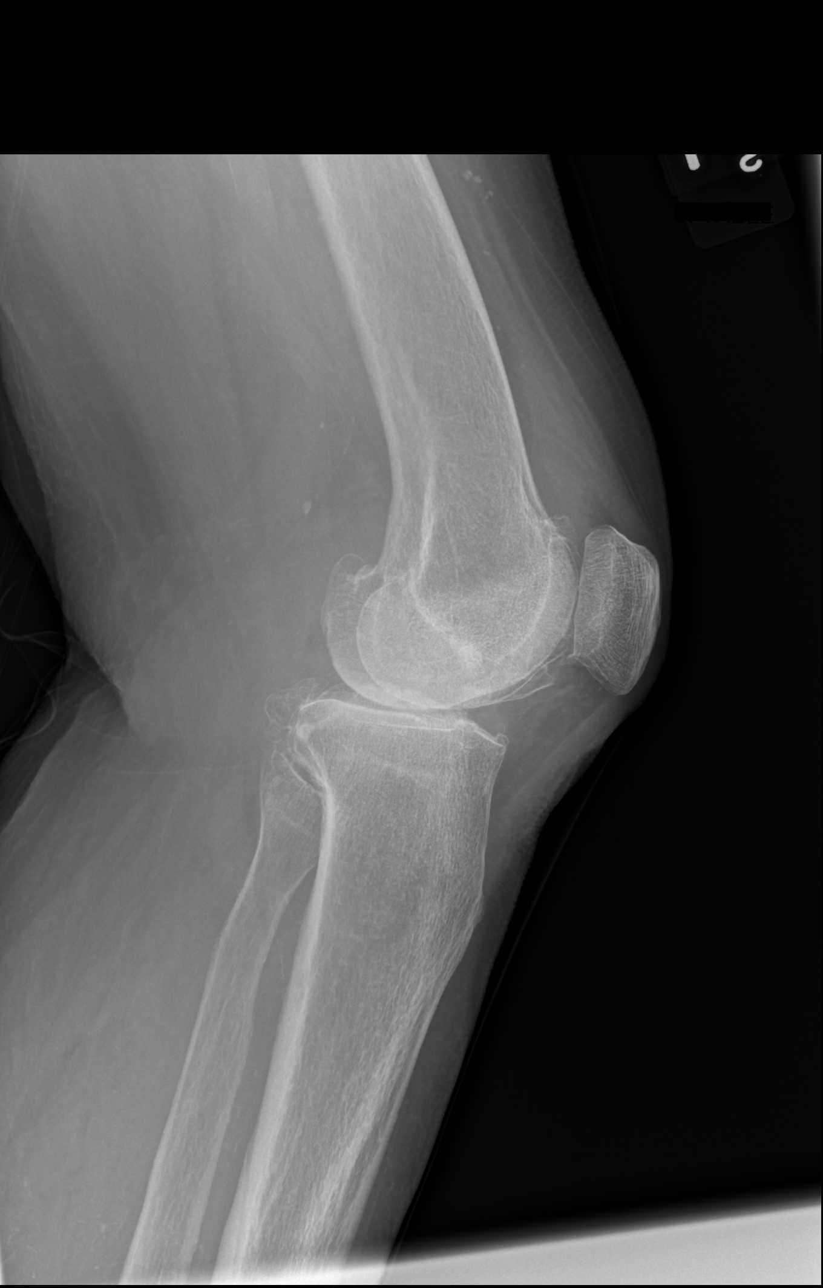

[4 of 4 positions shown; findings below may reference images not displayed]

FINDINGS: No acute bony or joint abnormality is identified. The patient has
advanced tricompartmental osteoarthritis appearing worst laterally.
Small joint effusion is noted.
IMPRESSION: No acute finding.

Advanced tricompartmental osteoarthritis.

## 2014-12-24 ENCOUNTER — Ambulatory Visit
Admit: 2014-12-24 | Disposition: A | Discharge status: Home / Self Care | Payer: Self-pay | Attending: Internal Medicine | Admitting: Internal Medicine

## 2015-01-28 ENCOUNTER — Other Ambulatory Visit
Admit: 2015-01-28 | Disposition: A | Discharge status: Home / Self Care | Payer: Self-pay | Attending: Internal Medicine | Admitting: Internal Medicine

## 2015-01-28 LAB — PRO B NATRIURETIC PEPTIDE: B-TYPE NATIURETIC PEPTID: 50 pg/mL

## 2015-01-30 ENCOUNTER — Ambulatory Visit
Admit: 2015-01-30 | Disposition: A | Discharge status: Home / Self Care | Payer: Self-pay | Attending: Internal Medicine | Admitting: Internal Medicine

## 2015-02-02 DIAGNOSIS — G4700 Insomnia, unspecified: Secondary | ICD-10-CM | POA: Insufficient documentation

## 2015-02-02 DIAGNOSIS — M170 Bilateral primary osteoarthritis of knee: Secondary | ICD-10-CM | POA: Insufficient documentation

## 2015-02-03 DIAGNOSIS — K529 Noninfective gastroenteritis and colitis, unspecified: Secondary | ICD-10-CM | POA: Insufficient documentation

## 2015-02-14 NOTE — Discharge Summary (Signed)
PATIENT NAME:  Cindy Sullivan, Cindy Sullivan MR#:  [id number] DATE OF BIRTH:  August 17, 1929  DATE OF ADMISSION:  11/18/2012  DATE OF DISCHARGE:  12/01/2012  Please see interim discharge summary dictated by Dr. Tressia Miners on the 5th of February.  DISCHARGE DIAGNOSES: 1.  Acute cerebrovascular accident with left pontine infarct, now improving, with minimal right-sided numbness.  On low-dose aspirin and statin.  2. Acute on chronic blood loss anemia, likely lower gastrointestinal bleed, diverticular in nature, now hemodynamically stable, tolerating baby aspirin.  3.  Hypokalemia/hypomagnesemia. Repleted and resolved.  4.  Left ankle gout, resolved.  5.  Staph enterocolitis on vancomycin, day 2 out of 7.    SECONDARY DIAGNOSES: 1.  Diabetes.  2.  Diabetic gastroparesis.  3.  Diabetic peripheral neuropathy.  4.  Gastroesophageal reflux disease.  5.  Anxiety. 6.  Chronic anemia.  7.  Hypercholesterolemia.  8.  Hypertension.  9.  Chronic back pain.  10.  Abdominal pain.  11.  History of pancreatitis.  12.  Chronic diarrhea.   CONSULTATIONS:   1.  GI, Dr. Vira Agar.  2.  Surgery, Dr. Burt Knack.   3.  Neurology, Dr. Irish Elders  PROCEDURES AND RADIOLOGY: As dictated by Dr. Tressia Miners in the interim summary on the 5th of February. In addition, bilateral carotid Doppler was performed on the 5th of February, which showed no evidence of hemodynamically significant stenosis. A 2D echo on the 6th of February showed normal LV systolic function with  EF of more than 55%. Impaired LV relaxation. Normal RV systolic pressure.   HISTORY AND SHORT HOSPITAL COURSE:  The patient is an 81 year old female with the above-mentioned medical problems, who was admitted for bright red blood per rectum, thought to be diverticular in nature.  Surgical consultation was obtained with Dr. Burt Knack. Please see Dr. Graciela Husbands dictated history and physical for further details. Dr. Burt Knack recommended GI consultation, which was performed with Dr.  Vira Agar, who recommended conservative management. Please see Dr. Wyatt Portela dictated interim summary for further details of hospital course. The patient was slowly improving, was evaluated by physical therapy, and she was found to have right-sided numbness, and her CT and subsequent MRI showed acute pontine stroke. Considering her GI bleed, she was slowly started on baby aspirin after consultation with GI and surgery. The patient has been tolerating aspirin without any further bleed, and tolerating diet also.  She is being discharged to [facility], as per recommendation from physical therapist on the 7th of February, in stable condition. On the date of discharge, her vital signs are as follows: Temperature 97.9, heart rate 89 per minute, respirations 20 per minute, blood pressure 112/70 mmHg, saturating 96% on room air.   PERTINENT PHYSICAL EXAMINATION ON THE DATE OF DISCHARGE:  CARDIOVASCULAR:  S1, S2 normal. No murmurs, rubs or gallops.  LUNGS:  Clear to auscultation bilaterally. No wheezing, rales, rhonchi or crepitation.  ABDOMEN:  Soft, benign.  NEUROLOGIC:  Nonfocal examination. All other physical examination remained at baseline.   DISCHARGE MEDICATIONS: 1.  Amitriptyline 25 mg p.o. at bedtime.  2.  Irbesartan 300 mg p.o. daily.  3.   Multivitamin once daily.  4.  Omeprazole 40 mg p.o. daily.  5.  Ambien 10 mg p.o. at bedtime.  6.  Meperidine 50 mg p.o. 3 times a day.    7.  Vicodin 5/500 one tablet p.o. 3 times a day as needed.  8.  Alprazolam 0.5 mg p.o. 3 times a day.  9.  Novolin 70/30, 40 units subQ b.i.d.  10.  Crestor 5 mg p.o. at bedtime.  11.  Phenergan 25 mg p.o. every 4 hours as needed.  12.  Vancomycin 5 mL p.o. every 6 hours for 5 more days.  13.  Aspirin 81 mg p.o. daily.   DISCHARGE DIET: Low sodium, 1800 ADA, low residue diet.   DISCHARGE ACTIVITY: As tolerated.   DISCHARGE INSTRUCTIONS AND FOLLOWUP: The patient was instructed to stop hydrochlorothiazide, which she  was taking at home.  She will need followup with her primary care physician, Dr. Apolonio Schneiders, in 1 to 2 weeks.  She will need followup with Dr. Vira Agar from GI in 2 weeks. She will get physical therapy evaluation and management while at the facility.   Total time discharging this patient was 55 minutes.   ____________________________ Lucina Mellow. Manuella Ghazi, MD vss:dm D: 12/01/2012 09:12:00 ET T: 12/01/2012 10:03:50 ET JOB#: [id number]  cc: Timithy Arons S. Manuella Ghazi, MD, <Dictator> Vianne Bulls. Arline Asp, MD Manya Silvas, MD Dr. Burt Knack Leotis Pain, MD  Lucina Mellow [facility] MD ELECTRONICALLY SIGNED 12/01/2012 17:14

## 2015-02-14 NOTE — Consult Note (Signed)
PATIENT NAME:  Cindy Sullivan, BARSANTI MR#:  [id number] DATE OF BIRTH:  [birth year]  DATE OF CONSULTATION:  11/18/2012  CONSULTING PHYSICIAN:  Jerrol Banana. Burt Knack, MD  DATE OF CONSULTATION: 11/18/2012   CHIEF COMPLAINT: Rectal bleeding.   HISTORY OF PRESENT ILLNESS: This is an 81 year old female patient. She has had 2 episodes of rectal bleeding, bright red blood, minimal amount, without significant hemodynamic instability. A CT scan obtained yesterday demonstrated probable rectal perforation with air bubbles around the rectum. Personal review of this suggests that it is high rectum versus possible sigmoid, but is probably extraperitoneal and at the cephalad extent or proximal rectum. The patient denies any instrumentation, including enemas, and she has had this happen once before. It has spontaneously stopped at this point.   PAST MEDICAL HISTORY: Diabetes, peripheral neuropathy, reflux disease, chronic anemia, hypercholesterolemia, hypertension.   PAST SURGICAL HISTORY: Appendectomy, cholecystectomy, hernia repair both ventral and possibly left inguinal, ex lap for small bowel obstruction, hysterectomy.   ALLERGIES: MULTIPLE, SEE CHART.   MEDICATIONS: Multiple, see chart.   FAMILY HISTORY: Noncontributory.   SOCIAL HISTORY: The patient does not smoke or drink. She lives at home.   REVIEW OF SYSTEMS: A 10-system review was performed and negative with the exception of that mentioned in the HPI.   PHYSICAL EXAMINATION:  GENERAL: Healthy, comfortable-appearing Caucasian female patient.  VITAL SIGNS: She is afebrile. Heart rate in the 70s, respiratory rate 16, blood pressure within normal limits.  HEENT: Shows no scleral icterus.  NECK: No palpable neck nodes.  CHEST: Clear to auscultation.  CARDIAC: Regular rate and rhythm.  ABDOMEN: Soft. Multiple scars are noted. Nondistended and nontender.  EXTREMITIES: Without edema.  NEUROLOGIC: Grossly intact.  INTEGUMENT: Shows no jaundice.  RECTAL: Shows  no mass, but some dark blood on the digital exam.  IMAGING: CT scan is personally reviewed. There are some extraluminal air bubbles at the proximal rectum and believed to be extraperitoneal.   LABORATORY: Electrolytes demonstrate a serum sodium of 126, chloride of 92, glucose of 143 and a potassium of 4.1. White blood count of 13.6, H and H of 12 and 36.   ASSESSMENT AND PLAN: This is a patient with probable extraperitoneal rectal perforation. This is at the very proximal end of the rectum and likely represents diverticulitis. In this age group, one must consider malignancy as well, and endoscopy may be indicated in this patient, but it may also be prudent to wait until adequate antibiotic therapy has been instituted and completed. If she were to continue to bleed, then rigid sigmoidoscopy would be of assistance. There was no mass palpable on digital examination, and colonoscopy could certainly be performed as well at a later date, but at this point, I believe endoscopy should be delayed slightly until one could ensure that perforation did not worsen.      ____________________________ Jerrol Banana Burt Knack, MD rec:OSi D: 11/18/2012 11:45:49 ET T: 11/18/2012 12:01:27 ET JOB#: [id number]  cc: Jerrol Banana. Burt Knack, MD, <Dictator> Florene Glen MD ELECTRONICALLY SIGNED 11/18/2012 12:37

## 2015-02-14 NOTE — Consult Note (Signed)
PATIENT NAME:  Cindy Sullivan, Cindy Sullivan MR#:  [id number] DATE OF BIRTH:  10-Jun-1929  DATE OF CONSULTATION:  11/27/2012  REFERRING PHYSICIAN:   CONSULTING PHYSICIAN:  Kriste Broman K. Mickael Mcnutt, MD  SEX:     Female RACE:  White AGE:     81 years  SUBJECTIVE:  The patient was seen in the room along with her husband.  The patient is an 79 year old white female, who is married for 47 years and lives with her husband who is 27 years old and who was with her in the room during the interview.  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  She lives with her husband.   CHIEF COMPLAINT:  "I came here for bleeding, now it is better.  I have problems with diverticulitis".  HISTORY OF PRESENT ILLNESS:  The patient reports that she has long-standing problem with diverticulitis and has bleeding and sometimes it is under control and currently she has had problems and she reports that they did scans and are waiting for the results.  Hopefully, she will feel better.  The husband reports that they are worried that she is going to bleed to death and this has been a constant worry for her in the past few days.    PAST PSYCHIATRIC HISTORY:  The patient denies any previous history of inpatient on psychiatry, no history of suicide attempts, not being followed by any psychiatrist.  The patient reports, "no, I want to live and I want to be happy".  Alcohol and drugs are denied.    MENTAL STATUS EXAMINATION:  The patient is seen laying in bed comfortably, joking around with her husband.  She was alert and oriented to place, person and time.  She knew that she was in [facility] in [place], [place].  She knew the date and she said that in 2 days her birthday is coming up and she is going to be 81 years old.  She knew the capitol of [place] with prompting but she could not name the current president and she reports that he is in her mind but that she  could not name him, but she could name other presidents of the [place].  She knew the president lived in the white house in [place], [place].  She absolutely denies feeling depressed and, in fact, was joking around.  She denies feeling hopeless or helpless.  Denies feeling worthless or useless but is worried about physical problems, health and bleeding.  She absolutely denies any ideas or plans to hurt herself or others.  She denies any paranoid or suspicious ideas.  Denies hearing voices or seeing things.  Memory is intact.  Recall is good.  She could remember all the 3 objects that she was asked to remember after a few minutes and several minutes with a little prompting help.  She could count money. S he could spell the word "world" forward but could not spell it backward and said that she could not do backward stuff.  Insight and judgment fair.  She wants to get help but is worried about her physical problems.  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  RECOMMENDATION:  You may start her on Prozac at 20 mg p.o. daily which would probably help her with her mood and worry at this time.  Now the patient is competent to take  care of her decisions and considered competent to handle her problems and make decisions at this time.    ____________________________ Wallace Cullens. Franchot Mimes, MD skc:ct D: 11/27/2012 17:07:38 ET T: 11/28/2012 07:21:35 ET JOB#: [id number]  cc: Arlyn Leak K. Franchot Mimes, MD, <Dictator> Dewain Penning MD ELECTRONICALLY SIGNED 11/28/2012 14:15

## 2015-02-14 NOTE — Consult Note (Signed)
PATIENT NAME:  Cindy Sullivan, Cindy Sullivan MR#:  [id number] DATE OF BIRTH:  Sep 18, 1929  DATE OF CONSULTATION:  11/28/2012  CONSULTING PHYSICIAN:  Shell Yandow K. Zanylah Hardie, MD  SUBJECTIVE: The patient was seen in her room along with her husband.  She is an 81 year old white female, married for 5 years, and lives with her husband, and gets along well with him.  Today, the patient and husband both report that they are glad that she is in good hands, and that she got her MRI done, and they are waiting for the result and still worried about the cause for her bleeding.    OBJECTIVE: The patient is seen resting in bed, calm, pleasant and cooperative.  She is alert and oriented to place, person and time, fully aware of the situation that got her for admission.  Today, she is cheerful and smiling, denies feeling depressed, denies feeling hopeless or helpless.  She does have anxiety and worry about her medical problems, bleeding secondary to diverticulitis, but is glad that these problems are being taken care of.  She absolutely denies any ideas or plans to hurt herself or others.  She denies having any problems with forgetfulness, and her husband stated, "no more than anybody else."  Insight and judgment are fair.   IMPRESSION:  Mood disorder secondary to physical problems such as bleeding secondary to diverticulitis.   RECOMMENDATIONS: As stated earlier, you may start her on Prozac 20 mg p.o. in the morning which will help her with her mood and worry.  The patient is considered competent to take care of her problems and handle her problems and make decisions at this time.   ____________________________ Wallace Cullens. Franchot Mimes, MD skc:cb D: 11/28/2012 14:10:37 ET T: 11/28/2012 14:14:56 ET JOB#: [id number]  cc: Arlyn Leak K. Franchot Mimes, MD, <Dictator> Dewain Penning MD ELECTRONICALLY SIGNED 12/01/2012 15:10

## 2015-02-14 NOTE — Consult Note (Signed)
Brief Consult Note: Diagnosis: rectal perforation.   Patient was seen by consultant.   Consult note dictated.   Recommend to proceed with surgery or procedure.   Comments: pt has sign of a rectal perforation, extraperitoneal, without hx of instrumentation. Will need endoscopy, possibly rigid sigmoidoscpy to delineate cause but malignancy in this age group would be at the top of the list. Rectal exam did not identify mass. Will follow and schedule sig for Monday unless GI wants to perform colonoscopy.  Electronic Signatures: Florene Glen (MD)  (Signed 25-Jan-14 11:34)  Authored: Brief Consult Note   Last Updated: 25-Jan-14 11:34 by Florene Glen (MD)

## 2015-02-14 NOTE — Consult Note (Signed)
PATIENT NAME:  Cindy Sullivan, Cindy Sullivan MR#:  [id number] DATE OF BIRTH:  December 15, 1928  DATE OF CONSULTATION:  11/18/2012  CONSULTING PHYSICIAN:  Manya Silvas, MD  HISTORY OF PRESENT ILLNESS: The patient is an 81 year old white female who was admitted with lower GI bleeding. She has a history of diverticulosis. She had onset of symptoms a day or 2 ago of bright red blood, a large amount in the toilet. She also had diffuse lower abdominal pain that day. She had some chills and felt hot at times. The second bowel movement also showed blood and she decided to come to the hospital for evaluation. She has does complain of nausea but no vomiting, and with the passage of blood she had bladder spasms as well.   She had an admission in November 2012 and had a colonoscopy by Dr. Candace Cruise that showed diverticulosis throughout the colon The patient had a CT of the abdomen and pelvis on this admission that showed some abnormalities in the rectal wall. Dr. Burt Knack looked at this CAT scan and felt that the abnormalities were consistent with diverticulitis of the proximal rectum.   The patient is complaining of a little bit of lightheadedness and dizziness at times.   PAST SURGICAL HISTORY:  1.  Appendectomy.  2.  Cholecystectomy. 3.  Port-A-Cath placement for treatment of chronic anemia.  4.  Hernia repair x 2.  5.  Exploratory laparotomy for lysis of adhesions.  6.  Left knee surgery.  7.  Hysterectomy with solitary ovarian resection.   PAST MEDICAL HISTORY:  1.  Type 2 diabetes. 2.  Diabetic gastroparesis.  3.  Diabetic peripheral neuropathy.  4.  GERD.  5.  Chronic anxiety.  6.  Chronic anemia, [facility] at the [facility].  7.  Hypercholesterolemia.  8.  Hypertension.  9.  Chronic back pain. 10.  Abdominal pain. 11.  Thrombocytosis, leukocytosis, iron deficiency anemia requiring IV iron in the past.  12.  History of pancreatitis.  13.  Recurrent dizziness.  14.  History of chronic diarrhea.   ALLERGIES:  NSAIDS, CODEINE, IVP DYE, LIPITOR, SEPTRA AND TOLECTIN.   HOME MEDICATIONS: Meperidine 50 mg oral t.i.d. p.r.n. for pain, Vicodin t.i.d. p.r.n. for pain, irbesartan 300 mg oral daily, amitriptyline 25 mg at bedtime, Novolin insulin 40 units subcutaneous 2 times a day, Phenergan 25 mg q.4 p.r.n., Crestor 5 mg at bedtime, Xanax 0.5 mg 3 times a day, Ambien 10 mg at bedtime, omeprazole 40 mg daily, multivitamins one a day,  HCTZ 25 mg 1 a day.   FAMILY HISTORY: Mother with lupus.   SOCIAL HISTORY: No history of smoking or drinking. Husband is alive.   REVIEW OF SYSTEMS: Pertinent for weakness, fatigue, nausea, abdominal pain, rectal bleeding, diarrhea. Negative for jaundice. Negative for constipation. Negative for chest pain. Negative for shortness of breath. Negative for wheezing. Positive for bladder spasms in the last couple of days. Positive for anemia with iron transfusions in the past. Positive for low back pain.   PHYSICAL EXAMINATION:  GENERAL: Elderly white female in no acute distress.  VITAL SIGNS: Blood pressure 164/80, pulse 80, temperature 97.9, O2 sat 98% on room air.  HEENT: Sclerae anicteric. Conjunctivae negative. Tongue negative. The head is atraumatic.  NECK: No thyromegaly.  CHEST: Clear in the anterior fields. She has a Port-A-Cath in the right upper chest.  HEART: No murmurs or gallops that I can hear.  ABDOMEN: No hepatosplenomegaly. No masses. No bruits. There is some mild tenderness in the suprapubic area. Mild  discomfort with coughing.   LABORATORY DATA: Glucose 143, BUN 15, creatinine 0.96, sodium 126, potassium 4.1, chloride 92, CO2 of 26. White count 13.6, hemoglobin 12.2; repeat is 10.7. INR 1.   ASSESSMENT AND PLAN: Probable diverticulitis of the rectum/rectosigmoid area causing some air to be in the wall of the rectum. The pockets of air could be small diverticula, but most likely it is due to inflammation. The patient's case was discussed with Dr. Burt Knack, surgeon,  and we both feel that antibiotics and serial observations are recommended here and to avoid instrumentation if possible due to the possible fragile nature of the abnormal mucosa. The patient is in agreement with this plan. I will follow with you.    ____________________________ Manya Silvas, MD rte:jm D: 11/18/2012 13:37:40 ET T: 11/18/2012 14:20:37 ET JOB#: [id number]  cc: Manya Silvas, MD, <Dictator> Vianne Bulls. Arline Asp, MD Loney Hering, MD Jerrol Banana. Burt Knack, MD Manya Silvas MD ELECTRONICALLY SIGNED 12/14/2012 7:57

## 2015-02-14 NOTE — Consult Note (Signed)
CC: gi bleed, lower Pt denies abd pain, no further LGI bleeding, the CT today shows signif improvement, no diverticulitis, no air in wall of rectum/colon.  Will advance to low residue diet.  Can taper off TPN and likely go home in 2 days or go to rehab.  Abd non tender.VSS afebrile.  Electronic Signatures: Manya Silvas (MD)  (Signed on 03-Feb-14 17:54)  Authored  Last Updated: 03-Feb-14 17:54 by Manya Silvas (MD)

## 2015-02-14 NOTE — Consult Note (Signed)
GM:WNUUVO bleed, lower abd pain.  Pt is pretty sure she passed blood from vagina, only a bit from rectum, no change in pain.  Dr. Earleen Newport recommends and I agree with pelvic ultrasound.  Also since she has not eaten much food and has central port in he will start TPN.  May need flex sig if pelvic ultrasound not helpful. Hgb falling slowly.  Electronic Signatures: Manya Silvas (MD)  (Signed on 29-Jan-14 15:11)  Authored  Last Updated: 29-Jan-14 15:11 by Manya Silvas (MD)

## 2015-02-14 NOTE — Consult Note (Signed)
CC rectal bleeding.  Pt with staph in stool.  This may be due to antibiotics used for diverticulitis and selecting out other bugs.  Will start oral vancomycin at this time, she could have staph enterocolitis on top of her diverticulitis.  I think the small risk of worsening bleeding is outweighed by the need for some degree of anticoagulation and would go ahead with ASA 81mg .  I will order this now, since neurology consult recommended it.  Electronic Signatures: Manya Silvas (MD)  (Signed on 05-Feb-14 13:30)  Authored  Last Updated: 05-Feb-14 13:30 by Manya Silvas (MD)

## 2015-02-14 NOTE — Consult Note (Signed)
CC: diverticulitis.  Pt says she feels better, no abd pain, good appetite, minimal numbness from pontine stroke, walked in halls without problems.  Started on ASA 81mg .  OK from GI to go to [facility] tomorrow.  Electronic Signatures: Manya Silvas (MD)  (Signed on 06-Feb-14 18:12)  Authored  Last Updated: 06-Feb-14 18:12 by Manya Silvas (MD)

## 2015-02-14 NOTE — Consult Note (Signed)
VSS afebrile, hgb 8.6, abd still mildly tender in suprapubic and LLQ.  Passed small amt of blood once. No new suggestions.  Discussed case with patient and family.  Electronic Signatures: Manya Silvas (MD)  (Signed on 28-Jan-14 17:09)  Authored  Last Updated: 28-Jan-14 17:09 by Manya Silvas (MD)

## 2015-02-14 NOTE — Consult Note (Signed)
Pt with pain in LLQ/surprapubic area.  passed some blood today but bleeding scan was neg.  I explained to pt and husband that there were two choices, either surgery or antibiotics and we want to try the antibiotics first.  Not likely for flex sign to help at this time.  She is voiding a lot and has some urinary accidents.  Likely the rectal wall inflammmation is irritating her bladder.  Will follow with you.   Electronic Signatures: Manya Silvas (MD)  (Signed on 27-Jan-14 18:13)  Authored  Last Updated: 27-Jan-14 18:13 by Manya Silvas (MD)

## 2015-02-14 NOTE — Consult Note (Signed)
PATIENT NAME:  Cindy Sullivan, Cindy Sullivan MR#:  [id number] DATE OF BIRTH:  12/27/1928  DATE OF CONSULTATION:  11/29/2012  CONSULTING PHYSICIAN:  Leotis Pain, MD  REASON FOR CONSULTATION: Right-sided numbness. All information was obtained from the patient herself and the chart.   HISTORY OF PRESENT ILLNESS This is a pleasant 81 year old female with significant past medical history of diverticulosis, history of lower GI bleed in the past, type 2 diabetes, hypertension, chronic anemia and hyperlipidemia who presented on the 11/18/2012 with bright red blood per rectum. On admission, hemoglobin has been stable at 12.2. She is status post CT of the abdomen and pelvis that did not show any acute diverticulosis and evidence of diverticulitis. As per patient in the hospital, she  says her bleeding has significantly improved and no signs in the past day. Reason for neurological consultation is right-sided numbness. The patient states that 2 nights ago during the night she  was watching TV and started feeling numbness on the right side. It started in the right face and then progressed to right arm and right leg. She also noticed that her hearing has significantly declined in her right ear. She has no prior history of similar events. She denies any speech deficits but does state that her husband thinks that she is garbling her words. She denies any new motor deficits. Currently she believes that her numbness has significantly improved, and she is close to back to her baseline.  The only aspect that is still distal bothering her is decreased hearing in the right ear. When the patient is  evaluated, she does not have any further complaints as she has been started on a regular diet. MRI of the brain was reviewed.  She has a left pontine infarct that is ischemic in nature.  In the past, the patient has been on antiplatelet of 81 mg and that had since been discontinued because of the GI bleed.   PAST MEDICAL HISTORY: As described  above.   PAST SURGICAL HISTORY: Appendectomy, cholecystectomy, hernia repair, explorational lap for lysis and adhesions, left knee surgery, hysterectomy, with solitary vein resections.   ALLERGIES: NSAIDS, CODEINE, INTRAVENOUS PYELOGRAM, LIPITOR.   HOME MEDICATIONS: Have been reviewed.   FAMILY HISTORY: Significant for diabetes, hypertension.   SOCIAL HISTORY: Denies history of drinking and smoking. She lives with her husband.   REVIEW OF SYSTEMS:  CONSTITUTIONAL: Denies any fever, chills.   HEENT: No blurry vision. Denies any tinnitus, ear pain.  RESPIRATORY: No wheezing. No cough. GASTROINTESTINAL: Denies any hematuria and denies any diarrhea, any abdominal pain. HEMATOLOGIC: Denies any cold or heat intolerance.  NEUROLOGICAL: As described above.  PSYCHIATRIC: Has history of anxiety and insomnia   PHYSICAL EXAMINATION: VITAL SIGNS: Temperature is 98.4, pulse 91, respirations 20, blood pressure 146/78, pulse ox is 96, and that is on room air rest.   NEUROLOGICAL: The patient is alert, awake and oriented to place, location and time.  Cranial nerve examination: Visual fields appear to be intact. No visual field cut is noted. She does have decreased hearing in the right ear. Facial sense is intact. Facial motor is intact. The numbness has completely subsided on the right side. There is questionable dysarthria, but it is difficult to assess as I had not spoken to the patient prior to this stroke. Palate elevates symmetrically. Tongue is midline. Shoulder shrug is symmetrical bilaterally.   Motor strength examination: The patient is right-handed. The tone is within normal limits. There appears to be very minimal right pronator drift of  the right upper extremity 5-/5, left upper is 5/5, lower extremities are 4+/5 bilaterally.   Currently sensation appears to be intact. Coordination: Finger-to-nose is intact. Reflexes are 1+ symmetrical bilaterally. Gait not assessed.   IMPRESSION: The  patient is an 81 year old female with history of diverticulosis, admitted with GI bleed. Currently bleeding has stopped, but she experienced two nights ago numbness on the right side of the body that has resolved. Her current complaint is decreased hearing on the right side. She is currently not on any antiplatelet because of a bleed. MRI reviewed showed a left pontine ischemic infarct.   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  At this point, it is difficult to prevent her for secondary stroke prevention because the patient has GI bleeding and cannot be started on antiplatelets.  RECOMMENDATIONS:  I would recommend finishing stroke work-up including 2-D echo, carotid Dopplers, if possible.  Also, I would recommend tele if the patient is not on it to make sure we do not have any cardiac arrhythmia that could have caused an embolus to shoot up and cause this ischemic event. As explained above, the patient's symptoms are improving and I suspect they will improve. Please have PT, OT see the patient as well as speech therapy to assess her speech. Whenever possible, please start her on antiplatelet agent, even if it is aspirin 81 mg, whenever feasible as per GI.   Thank you. It was a pleasure seeing this patient.   ____________________________ Leotis Pain, MD yz:cb D: 11/29/2012 11:07:11 ET T: 11/29/2012 11:34:08 ET JOB#: [id number]  cc: Leotis Pain, MD, <Dictator> Leotis Pain MD ELECTRONICALLY SIGNED 12/03/2012 18:12

## 2015-02-14 NOTE — Consult Note (Signed)
Pt with no bleeding today, abd feels better to her.  Pt on antibiotics and TPN due to holding food given the CT report several days ago.  Abd less tender today, continue treatment.  If she relapses will do flex Monday.  Electronic Signatures: Manya Silvas (MD)  (Signed on 30-Jan-14 16:54)  Authored  Last Updated: 30-Jan-14 16:54 by Manya Silvas (MD)

## 2015-02-14 NOTE — Consult Note (Signed)
CC:abd pain.  She has diverticulitis of rectum/rectosigmoid.  Seems better today, she slept well and awoke refreshed and not groggy. She would like to be able to take the same medicine at home.  No pain in lower abd with coughing but some minimal with deep palpation.  WBC 11.5,  hgb 00.4, VSS but systolic up to 599,  No bleeding.  Continue current meds.  Electronic Signatures: Manya Silvas (MD)  (Signed on 26-Jan-14 11:52)  Authored  Last Updated: 26-Jan-14 11:52 by Manya Silvas (MD)

## 2015-02-14 NOTE — H&P (Signed)
PATIENT NAME:  Cindy Sullivan, Cindy Sullivan MR#:  [id number] DATE OF BIRTH:  Nov 27, 1928  DATE OF ADMISSION:  11/18/2012  REFERRING PHYSICIAN: Loney Hering, MD  PRIMARY CARE PHYSICIAN: Vianne Bulls. Arline Asp, MD  CHIEF COMPLAINT: Bright red blood per rectum.   HISTORY OF PRESENT ILLNESS: This is an 81 year old female with significant past medical history of diverticulosis, with history of lower GI bleed in the past, type 2 diabetes, hypertension, chronic anemia, hypertension and hyperlipidemia, who presents with complaints of bright red blood per rectum. The patient reports she has been having these episodes on and off over the last few months, but she reports she did have 2 significantly large episodes of bright red blood per rectum this evening, which prompted her to come to ED. The patient had last admission in May 2013 due to similar presentation. As well, she had colonoscopy done by Dr. Vira Agar in November 2012, which did show diverticulosis throughout the colon. The patient's hemoglobin was stable at 12.2. She had CT abdomen and pelvis which did show moderate diverticulosis and evidence of diverticulitis, with some abnormalities in the rectal and anal wall. As well, the patient has been complaining of abdominal pain, with complaints of some nausea, but denies any vomiting. She has been complaining of dizziness and lightheadedness as well. Denies any change in the color of the stools. The patient was Hemoccult positive in ED and had bright red blood on rectal exam by ED physician. The patient had significant leukocytosis of 13,000. Hospitalist service was requested to admit the patient for further management and workup of patient's bright red blood per rectum episode.   PAST MEDICAL HISTORY:  1. Type 2 diabetes.  2. Diabetic gastroparesis.  3. Diabetic peripheral neuropathy.  4. Gastroesophageal reflux disease.  5. Anxiety.  6. Chronic anemia.  7. Hypercholesterolemia.  8. Hypertension.  9. Chronic back pain.   10. Abdominal pain.  11. History of leukocytosis, thrombocytosis and iron deficiency anemia, requiring IV iron in the past, which was done by Dr. Ma Hillock.  12. History of pancreatitis.  13. Recurrent dizziness.  14. History of chronic diarrhea.   PAST SURGICAL HISTORY:  1. Appendectomy.  2. Cholecystectomy.  3. Port-A-Cath placement.  4. Hernia repair x2.  5. Exploratory laparotomy for lysis of adhesions.  6. Left knee surgery.  7. Hysterectomy with solitary ovarian resection.   ALLERGIES:  1. NSAIDS.  2. CODEINE.  3. INTRAVENOUS PYELOGRAM. 4. LIPITOR. 5. SEPTRA.  6. TOLECTIN DS.   HOME MEDICATIONS:  1. Meperidine 50 mg oral 3 times a day. 2. Vicodin one 3 times a day as needed.  3. Irbesartan 300 mg oral daily.  4. Amitriptyline 25 mg oral at bedtime.  5. Novolin 70/30 at 40 units subcutaneous 2 times a day.  6. Phenergan 25 mg 4 times a day as needed.  7. Crestor 5 mg oral at bedtime.  8. Xanax 0.5 mg 3 times a day.  9. Ambien 10 mg at bedtime.  10. Omeprazole 40 mg oral daily.  11. Multivitamin 1 tablet oral daily.  12. Hydrochlorothiazide 25 mg oral daily.   FAMILY HISTORY: Significant for diabetes and hypertension, mother also had lupus.   SOCIAL HISTORY: No history of smoking or drinking. Lives with her husband.   REVIEW OF SYSTEMS:  CONSTITUTIONAL: Denies any fever or chills. Has complaints of weakness and fatigue.  EYES: Denies blurry vision, double vision, pain, inflammation or glaucoma.  ENT: Denies tinnitus, ear pain, hearing loss, epistaxis or nasal discharge.  RESPIRATORY: Denies cough,  wheezing, hemoptysis, dyspnea or COPD.  CARDIOVASCULAR: Denies chest pain, orthopnea, edema, arrhythmia, palpitations or syncope.  GASTROINTESTINAL: Had complaints of nausea, diarrhea, abdominal pain, bright red blood per rectum. Denies any vomiting, hematemesis, melena, jaundice or constipation.  GENITOURINARY: Denies dysuria, hematuria, renal colic.  ENDOCRINE: Denies  polyuria, polydipsia, heat or cold intolerance.  HEMATOLOGY: Has history of anemia requiring iron transfusion in the past, history of leukocytosis.  INTEGUMENTARY: Denies acne, rash or skin lesions.  MUSCULOSKELETAL: Has complaints of lower back pain. Denies any gout, cramps.  NEUROLOGIC: Denies any numbness, dysarthria, epilepsy, tremors, vertigo, CVA, TIA.  PSYCHIATRIC: Has history of anxiety and insomnia. Denies any schizophrenia, substance or alcohol abuse.   PHYSICAL EXAMINATION:  VITAL SIGNS: Temperature 98.6, pulse 75, respiratory rate 18, blood pressure 165/57, saturating 94% on room air.  GENERAL: Well-nourished elderly female, looks comfortable in bed, in no apparent distress.  HEENT: Head atraumatic, normocephalic. Pupils equal and reactive to light. Pink conjunctivae. Anicteric sclerae. Moist oral mucosa.  NECK: Supple. No thyromegaly. No JVD.  CHEST: Good air entry bilaterally. No wheezing, rales or rhonchi. Has Port-A-Cath in the right chest access.  CARDIOVASCULAR: S1, S2 heard. No rubs, murmur or gallops.  ABDOMEN: Has abdominal tenderness in the lower abdominal area. No rebound, no guarding. Bowel sounds present.  EXTREMITIES: No edema. No clubbing. No cyanosis.  PSYCHIATRIC: Appropriate affect. Awake and alert x3. Intact judgment and insight.  NEUROLOGIC: Cranial nerves grossly intact. Motor 5 out of 5 in all extremities.  SKIN: Normal skin turgor. Warm and dry.   PERTINENT LABORATORY: Glucose 143, BUN 15, creatinine 0.96, sodium 126, potassium 4.1, chloride 92, CO2 26. White blood cells 13.6, hemoglobin 12.2, hematocrit 35.2, platelets 401. INR 1. CT abdomen and pelvis without contrast showing a few punctate air densities within the anterior wall of the inferior rectum, possible with minimal extension of air into the anterior perirectal fat, question minimal acute mural injury and apparent asymmetric fullness involving the right posterolateral anal wall, and  presence of small  mural hematoma not excluded, anterior bladder is moderately distended, air within the bladder, question recent catheterization, cystitis not excluded.   ASSESSMENT AND PLAN:  1. Bright red blood per rectum. The patient has known history of colon diverticulosis on November 2013 colonoscopy. This is the most likely cause of her bleed. Will hold all anticoagulation, will monitor H and H, first hemoglobin appears to be stable. Will keep active type and screen. Will have the patient on clear liquid diet. Will consult GI, Dr. Vira Agar. The patient had some abnormal finding in the wall area in the rectal and anal area on the CT abdomen and pelvis, and she has some abdominal tenderness. Even though CT came back negative for diverticulitis, will have her empirically on Cipro and Flagyl, especially with her mild leukocytosis, and will consult surgical service as well.  2. Hyponatremia, appears to be chronic and at baseline, asymptomatic. Will monitor.  3. Type 2 diabetes. Will hold the patient's Novolin 70/30 as her p.o. is unpredictable. Will have her on insulin sliding scale.  4. Anxiety. Will continue with Xanax.  5. Hypercholesterolemia. Continue with Crestor.  6. Hypertension. Given her lightheadedness, will hold her medication, and if she is stable, will resume her back on irbesartan and hydrochlorothiazide.  7. Deep vein thrombosis prophylaxis. Sequential compression device.  8. Gastrointestinal prophylaxis. On PPI.   CODE STATUS: Full code.   TOTAL TIME SPENT ON ADMISSION AND PATIENT CARE: 60 minutes.   ____________________________ Albertine Patricia, MD dse:OSi  D: 11/18/2012 03:06:59 ET T: 11/18/2012 08:22:50 ET JOB#: [id number]  cc: Albertine Patricia, MD, <Dictator> DAWOOD Graciela Husbands MD ELECTRONICALLY SIGNED 11/19/2012 0:21

## 2015-02-14 NOTE — Consult Note (Signed)
CC: abd pain.  pt had a rough nite with migraine headache, diarrhea, accidents in bed.  Had much better day today.  Abd pain gone and no further BRBPR, some dark stool likely old blood.  Getting TPN while resting GI tract.  No peritoneal signs on exam.  Continue meds, consider repeat CT next week.  Electronic Signatures: Manya Silvas (MD)  (Signed on 31-Jan-14 18:05)  Authored  Last Updated: 31-Jan-14 18:05 by Manya Silvas (MD)

## 2015-02-15 NOTE — Discharge Summary (Signed)
PATIENT NAME:  Cindy Sullivan, ROUNDS MR#:  [id number] DATE OF BIRTH:  Jul 06, 1929  DATE OF ADMISSION:  01/31/2014 DATE OF DISCHARGE:  02/01/2014  PRESENTING COMPLAINT: Hypoglycemia.   DISCHARGE DIAGNOSES:  1. Hypoglycemia, resolved.  2. Type 2 diabetes. 3. Hypertension.  4. Morbid obesity. 5. Urinary tract infection.  MEDICATIONS:  1. Amitriptyline 25 mg at bedtime.  2. Irbesartan 300 mg p.o. daily.  3. Omeprazole 40 mg daily.  4. Meperidine 50 mg 3 times a day.  5. Amlodipine 5 mg daily.  6. Atorvastatin 20 mg daily.  7. Insulin 70/30 at 20 units b.i.d.  8. Levaquin once a day.   DIET: Carbohydrate controlled.   FOLLOWUP: With Dr. Baldemar Lenis, [facility].   BRIEF SUMMARY OF HOSPITAL COURSE: [name] is an 81 year old Caucasian female who comes to the Emergency Room after she was found lethargic at home. She was found to be hypoglycemic. She was admitted with:   1. Hypoglycemia with acute encephalopathy, which resolved. The patient is back to baseline. Her sugars were found to be 24. She received IM glucagon and oral glucose at home. She received IV dextrose 5% bag in the Emergency Room. She is tolerating a p.o. diet well. She is now on insulin 70/30 at 20 units b.i.d., which she is going to be taking at home.  2. Hypertension. Continued irbesartan and amlodipine.  3. Gastroesophageal reflux disease. Continue omeprazole. 4. Urinary tract infection. Continue Levaquin.  5. Physical therapy for generalized weakness. The patient refused PT services at home.  7. Hospital stay otherwise remained stable. She will follow up with Dr. Baldemar Lenis as outpatient.  TIME SPENT: 40 minutes.   ____________________________ Hart Rochester Posey Pronto, MD sap:lb D: 02/05/2014 11:31:04 ET T: 02/05/2014 11:48:12 ET JOB#: [id number]  cc: Marylan Glore A. Posey Pronto, MD, <Dictator> Ilda Basset MD ELECTRONICALLY SIGNED 02/18/2014 9:07

## 2015-02-15 NOTE — Discharge Summary (Signed)
PATIENT NAME:  Cindy Sullivan, Cindy Sullivan MR#:  [id number] DATE OF BIRTH:  02-Oct-1929  DATE OF ADMISSION:  05/17/2014 DATE OF DISCHARGE:  05/20/2014  PRESENTING COMPLAINT:  Generalized weakness and body aches.   DISCHARGE DIAGNOSES:  1.  Acute renal failure due to dehydration, resolved.  2.  Gram-negative rods urinary tract infection.  3.  Hyponatremia secondary to dehydration and poor p.o. intake, improved.  4.  Type 2 diabetes.  5.  Hyperlipidemia.  6.  Hypertension.   CODE STATUS: Full code.   The patient will be discharged to rehabilitation for physical therapy.   MEDICATIONS AT DISCHARGE:  1.  Sliding scale insulin.  2.  Tylenol 650 mg p.o. q.4 hours p.r.n.  3.  Irbesartan 300 mg p.o. daily.  4.  Insulin, NovoLog 70/30 20 units b.i.d.  5.  Atorvastatin 20 mg daily.  6.  Cepacol lozenges 1 p.o. t.i.d. p.r.n.  7.  Ceftin 500 mg p.o. b.i.d.  8.  Mucinex 600 mg b.i.d.  9.  Demerol 50 mg q.8 hour p.r.n.  10. Amitriptyline 25 mg at bedtime.  11. Hydrochlorothiazide 25 mg daily.  12. Prilosec 40 mg daily.   LABORATORY DATA: BUN 23, creatinine 1.12, sodium 134, potassium 3.5, occult stool negative. Creatinine on admission was 2.32. H and H 10.3 and 31.1. Blood cultures negative in 48 hours. LFTs within normal limits, except SGOT of 116   Urinalysis positive for urinary tract infection. Urine culture gram-negative rods, likely Escherichia coli.   BRIEF SUMMARY OF HOSPITAL COURSE: Cindy Sullivan is an 81 year old Caucasian female with history of hypertension, hyperlipidemia, diabetes and diabetic neuropathy who comes to the hospital with increasing generalized weakness, noted to be in acute renal failure with hyponatremia and UTI. The patient was admitted with:  1.  Acute renal failure, likely due to dehydration in the setting of poor p.o. intake from UTI. She received IV fluids, her laboratory values improved.  Her creatinine is back from 2.85 to 1.1. Her hydrochlorothiazide low-dose and losartan  and her antihypertensive have been resumed. They were on hold until her creatinine improved.  2.  Hyponatremia, likely due to poor p.o. intake with hypovolemic hyponatremia, resolved with IV hydration.  3.  Urinary tract infection. Gram-negative rods, most likely Escherichia coli per labs. The patient was on IV Rocephin, improved clinically and changed to p.o. cefuroxime.  4.  History of type 2 diabetes. Continued 70/30 and sliding scale.  5.  Hyperlipidemia. Continue statins.  6.  Hypertension. Resume home meds.   The patient was evaluated by physical therapy who recommends rehabilitation. Social worker making arrangements for a rehabilitation bed.   TIME SPENT: 40 minutes. The patient is a full code.    ____________________________ Hart Rochester. Posey Pronto, MD sap:lt D: 05/20/2014 11:23:33 ET T: 05/20/2014 13:34:51 ET JOB#: [id number]  cc: Chrystal Zeimet A. Posey Pronto, MD, <Dictator> Ilda Basset MD ELECTRONICALLY SIGNED 06/05/2014 11:33

## 2015-02-15 NOTE — H&P (Signed)
PATIENT NAME:  Cindy Sullivan, Cindy Sullivan MR#:  [id number] DATE OF BIRTH:  12-Aug-1929  DATE OF ADMISSION:  01/31/2014  PRIMARY CARE PHYSICIAN: Dr. Baldemar Lenis  CHIEF COMPLAINT: Hypoglycemia.   HISTORY OF PRESENT ILLNESS: Cindy Sullivan is a pleasant 81 year old Caucasian female with past medical history of hypertension, diabetes, GERD, anxiety, and hypercholesterolemia who comes to the Emergency Room after her husband found the patient in the early hours of the morning unresponsive. He called EMS. Checked her sugar and it was 24. EMS gave glucagon and oral glucose. Hospitalist was thereby called for admission and further evaluation and management. The patient was also noted to have a UTI. The patient currently is doing well and her sugars are stable.   PAST MEDICAL HISTORY: 1.  Diverticulitis.  2.  GERD.  3.  Anxiety.  4.  Anemia.  5.  Hypercholesterolemia.  6.  Type 2 diabetes.  7.  Hypertension.  8.  Appendectomy.  9.  Gallbladder surgery.  10.  Partial hysterectomy.   MEDICATIONS: 1.  Omeprazole 40 mg daily.  2.  Novolin 70/30, 40 units b.i.d.  3.  Meperidine 50 mg 1 tablet 3 times a day.  4.  Irbesartan 300 mg p.o. daily.  5.  Atorvastatin 20 mg daily.  6.  Amlodipine 5 mg daily.  7.  Amitriptyline 25 mg daily at bedtime.    ALLERGIES: NSAIDS, CODEINE, IVP DYE, LIPITOR, SEPTRA, TOLECTIN DS.  FAMILY HISTORY: Positive for hypertension.   SOCIAL HISTORY: Married, lives with her husband at home. Nonsmoker, nonalcoholic.   REVIEW OF SYSTEMS: CONSTITUTIONAL: No fever, fatigue, weakness.  EYES: No blurred or double vision, glaucoma or cataracts.  ENT: No tinnitus, ear pain, or dysphagia.  RESPIRATORY: No wheeze, shortness of breath, COPD or cough.  CARDIOVASCULAR: No chest pain, arrhythmia, palpitations or syncope.  GASTROINTESTINAL: No nausea, vomiting, diarrhea, abdominal pain. No GERD.  GENITOURINARY: No dysuria or hematuria.  ENDOCRINE: Positive for diabetes. No heat or cold intolerance.   HEMATOLOGY: No anemia, easy bruising or bleeding disorder.  MUSCULOSKELETAL: Positive for arthritis. No gout or swelling.  NEUROLOGIC: No CVA, TIA. Positive for some mild confusion. Possible baseline dementia.  PSYCHIATRIC: No anxiety or depression. All other systems reviewed and negative.   PHYSICAL EXAMINATION: GENERAL: The patient is awake, alert, and oriented x3, not in acute distress.  VITAL SIGNS: Afebrile. Pulse is 94, blood pressure 167/70, and sats are 95% on room air.  HEENT: Atraumatic, normocephalic. Pupils are equal, round and reactive to light and accommodation. EOM intact. Oral mucosa is moist.  NECK: Supple. No JVD. No carotid bruits.  RESPIRATORY: Clear to auscultation bilaterally. No rales, rhonchi, respiratory distress or labored breathing.  CARDIOVASCULAR: Both the heart sounds are normal. Rate and rhythm regular. PMI not lateralized. Chest is nontender.  EXTREMITIES: Good pedal pulses. Good femoral pulses. No lower extremity edema.  ABDOMEN: Soft, benign, nontender. No organomegaly. Positive bowel sounds.  NEUROLOGIC: Grossly intact cranial nerves II through XII. No motor or sensory deficits.  SKIN: Warm and dry.   DIAGNOSTIC DATA: Blood glucose is 150. UA positive for UTI. White count is 16.9, H and H 12.5 and 38.8. Glucose is 167, BUN 31, creatinine 1.4, sodium 127, potassium 4.1, chloride 93, and bicarb 27. LFTs within normal limits. Total protein 8.7.   EKG shows normal sinus rhythm.   ASSESSMENT: An 81 year old, Cindy Sullivan, with history of hypertension, diabetes, and hyperlipidemia who comes to the Emergency Room with:  1.  Hypoglycemia with acute encephalopathy. The patient was found to have blood  sugar of 24, received IM glucagon and oral glucose at home. Her sugars have been stable. She received 5% IV dextrose bag in the Emergency Room. Her sugars are stable. She is tolerating p.o. diet well. I will place the patient on Accu-Chek every 3 hours and resume her back  on her insulin 70/30 20 units b.i.d. This dose is half of the dose she takes at home for now. Will monitor for any further hypoglycemic spells.  2.  Hypertension. Continue irbesartan and amlodipine.  3.  Gastroesophageal reflux disease. Continue omeprazole.  4.  Urinary tract infection. The patient is on p.o. Levaquin. We will follow-up urine cultures.  5.  Care management for discharge planning.  6.  Physical therapy for generalized weakness and gait ambulation.  7.  Deep vein thrombosis prophylaxis with subcutaneous heparin.   Further work-up according to the patient's clinical course. Hospital admission plan was discussed with the patient and the patient's husband who was present in the room. The patient is a FULL code.   TIME SPENT: 55 minutes.   ____________________________ Hart Rochester Posey Pronto, MD sap:sb D: 01/31/2014 14:29:57 ET T: 01/31/2014 14:53:49 ET JOB#: [id number]  cc: Loreli Debruler A. Posey Pronto, MD, <Dictator> Derinda Late, MD Ilda Basset MD ELECTRONICALLY SIGNED 02/18/2014 9:07

## 2015-02-15 NOTE — H&P (Signed)
PATIENT NAME:  Cindy Sullivan, Cindy Sullivan MR#:  [id number] DATE OF BIRTH:  [birth year]  DATE OF ADMISSION:  05/17/2014  PRIMARY CARE PHYSICIAN: Nonlocal.   REFERRING PHYSICIAN: Dr. Karma Greaser.   CHIEF COMPLAINT: Generalized weakness, generalized body aches.   HISTORY OF PRESENT ILLNESS: Cindy Sullivan is an 81 year old female with a history of multiple medical problems including diabetes mellitus, hypertension, hyperlipidemia with baseline poor functional status, has been experiencing generalized body aches for the last 3 days. The patient has been having decreased energy, unable to walk. Has been having mild cough with clear sputum. Had a few episodes of vomiting. Also has been having diarrhea, black stools. The patient is not on any iron supplements. The patient also has been experiencing subjective fevers. Concerning this, the patient is brought to the Emergency Department.   WORKUP IN THE EMERGENCY DEPARTMENT: The patient is found to have urinary tract infection with a WBC of 2600 with 3+ leukocyte esterase. The patient also has elevated white blood cell count of 17,000. The patient's kidney function significantly worsened from the baseline. The patient has a normal baseline creatinine from April 2015 of less than 1. Today is 2.85. The patient also has a low sodium of 121.   PAST MEDICAL HISTORY:  1. Diabetes mellitus, insulin-dependent.  2. Hypertension.  3. Hyperlipidemia.  4. Anemia of chronic disease and iron-deficiency anemia.  5. Anxiety.  6. Gastroesophageal reflex disease.  7. Diverticulitis.   PAST SURGICAL HISTORY:  1. Appendectomy.  2. Cholecystectomy.  3. Partial hysterectomy.   ALLERGIES:  1. NSAIDS.   2. SEPTRA.  3. TOLECTIN. 4. LIPITOR.  5. CODEINE.  6. IVP DYE.   HOME MEDICATIONS:  1. Tylenol 500 mg every 6 hours as needed.  2. Omeprazole 40 mg once a day.  3. 50 mg 3 times a day.  4. Irbesartan 300 mg once a day.  5. NovoLog 20 units 2 times a day.  6. Hydrochlorothiazide 25 mg  once a day.  7. Lipitor 25 mg once a day.  8. Amitriptyline 25 mg once a day.   SOCIAL HISTORY: No history of smoking, drinking alcohol, or using illicit drugs. Married, lives with her husband.   FAMILY HISTORY: Of diabetes mellitus.   REVIEW OF SYSTEMS:  CONSTITUTIONAL: Experiencing generalized weakness.  EYES: No change in vision.  ENT: No change in hearing.  RESPIRATORY: Has mild cough. No shortness of breath.  HEART: No chest pain, palpations.  GASTROINTESTINAL: Has nausea and black stools.  GENITOURINARY: Has dysuria.  HEMATOLOGIC: Has been having black stools.  ENDOCRINE: Has diagnosis of diabetes mellitus.  SKIN: No rash or lesions.  MUSCULOSKELETAL: Generalized body aches and also has history of arthritis.  NEUROLOGIC: No weakness or numbness in any part of the body.   PHYSICAL EXAMINATION:  GENERAL: This is a well-built, well-nourished, age-appropriate female lying down in the bed, not in distress.  VITAL SIGNS: Temperature 98.4, pulse 80, blood pressure 105/59, respiratory rate of 16, oxygen saturation is 94% on room air.  HEENT: Head normocephalic, atraumatic. No scleral icterus. Conjunctivae normal. Pupils equal and reactive. Extraocular movements are intact. Mucous membranes dry. No pharyngeal erythema.  NECK: Supple. No lymphadenopathy. No JVD. No carotid bruit.  CHEST: No focal tenderness. Unable to examine the posterior lungs as patient unable to lean forward secondary to severe debility.  HEART: S1, S2 regular. No murmurs are heard.  ABDOMEN: Bowel sounds present, soft. Has tenderness in the suprapubic area. No hepatosplenomegaly. No guarding or rebound tenderness.  EXTREMITIES: No pedal edema.  Pulses 2+.  SKIN: No rash or lesions.  MUSCULOSKELETAL: Good range of motion in all the extremities.  NEUROLOGIC: The patient is alert, oriented to place, person, and time. Cranial nerves II through XII intact. Motor 5/5 in upper and lower extremities.   LABORATORIES: CBC:  WBC of 17.5, hemoglobin 11, platelet count of 264,000. BUN 53, creatinine of 2.85, leukocyte esterase 3+, WBC 2600, 3+ bacteria.   ASSESSMENT AND PLAN: Cindy Sullivan is an 81 year old female who comes with pyelonephritis.  1. Pyelonephritis. Obtain urine cultures. Start the patient on Rocephin. Follow up with urine cultures.  2. Sepsis secondary to urinary tract infection. Continue to follow up with the blood cultures, urine cultures. Continue with Rocephin.  3. Hyponatremia, most likely secondary to hydrochlorothiazide as well as currently dehydration and nausea.  4. Black stools. We will check the stool occult. Current hemoglobin is 14.4. We will continue to follow up after hydration.  5. Acute renal failure, most likely secondary to prerenal. Continue with intravenous fluids and follow up.  6. Diabetes mellitus. Continue with the sliding scale insulin.  7. Severe debility: Will involve the physical therapy, occupational therapy.  8. Keep the patient on deep vein thrombosis prophylaxis with sequential compression devices.   TIME SPENT: 50 minutes.    ____________________________ Monica Becton, MD pv:lt D: 05/17/2014 21:05:09 ET T: 05/17/2014 22:15:30 ET JOB#: [id number]  cc: Monica Becton, MD, <Dictator> Grier Mitts Finnis Colee MD ELECTRONICALLY SIGNED 05/30/2014 21:05

## 2015-02-16 NOTE — Discharge Summary (Signed)
PATIENT NAME:  Cindy Sullivan, Cindy Sullivan MR#:  [id number] DATE OF BIRTH:  August 17, 1929  DATE OF ADMISSION:  02/25/2012 DATE OF DISCHARGE:  02/27/2012  PRESENTING COMPLAINT: Blood per rectum.   DISCHARGE DIAGNOSES:  1. Rectal bleed, suspected diverticular. The patient has had diverticular bleed in the past, resolved.  2. Hypertension.  3. Type 2 diabetes.   CONDITION ON DISCHARGE: Fair.   DISCHARGE MEDICATIONS: 1. Augmentin 875 mg p.o. twice a day for five more days.  2. Amitriptyline 25 mg at bedtime.  3. Crestor 5 mg at bedtime.  4. Xanax 0.5 mg three times daily. 5. Novolin 70/30 30 units twice a day. 6. Irbesartan 300 mg p.o. daily.  7. Multivitamin p.o. daily.  8. Demerol 50 mg three times daily as needed.   DISCHARGE INSTRUCTIONS/FOLLOWUP: Follow up with Dr. Vira Agar in 3 to 4 weeks. Follow up with Dr. Arline Asp as needed.   LABS/STUDIES: Hemoglobin at discharge is 10.3. White count is 10.7. Hemoglobin and hematocrit on admission was 11.1 and 33.2. Electrolytes were within normal limits, except sodium of 126, potassium 4.2, chloride 88, and creatinine of 199. LFTs were within normal limits. PT-INR were 13 and 0.9.   CONSULTANTS: Gaylyn Cheers, MD - Gastroenterology.  BRIEF SUMMARY OF HOSPITAL COURSE: Ms. Cindy Sullivan is an 81 year old Caucasian female with history of diverticulosis, hypertension, and diabetes who came in with:  1. Lower gastrointestinal/rectal bleeding: The patient has history of diverticulosis throughout the entire colon which was revealed by colonoscopy in November 2012. She was admitted on the medical floor. Serial hemoglobin and hematocrit were done. The patient did not have further bloody stools. Her hemoglobin remained stable. She was seen by Dr. Vira Agar who recommended conservative management. Clear liquids were started prior to discharge. The patient tolerated a soft diet.  2. Abdominal pain, left lower quadrant, right mid abdomen, with mildly elevated WBCs: The possibility of  diverticulitis was suspected. Hence she was started on IV Cipro and Flagyl and changed to Augmentin suspension since the patient has difficulty swallowing pills. She remained afebrile. White count was 10.7 at discharge.  3. Type 2 diabetes: Her home dose of insulin will be resumed at discharge.  4. Hypertension: Blood pressure remained stable. The patient was continued on her irbesartan.   Her hospital stay otherwise remained relatively stable. The patient will follow up with her primary care physician as needed and Dr. Vira Agar in 3 to 4 weeks.  TIME SPENT: 40 minutes.  ____________________________ Hart Rochester Posey Pronto, MD sap:slb D: 02/28/2012 07:19:21 ET T: 02/28/2012 10:58:07 ET JOB#: [id number]  cc: Trenden Hazelrigg A. Posey Pronto, MD, <Dictator> Vianne Bulls. Arline Asp, MD Manya Silvas, MD Ilda Basset MD ELECTRONICALLY SIGNED 03/05/2012 13:55

## 2015-02-16 NOTE — H&P (Signed)
PATIENT NAME:  Cindy Sullivan, Cindy Sullivan MR#:  [id number] DATE OF BIRTH:  1929-04-22  DATE OF ADMISSION:  02/25/2012  PRIMARY CARE PHYSICIAN:  Dr. Melina Modena GI:  Dr. Candace Cruise   CHIEF COMPLAINT: Passing blood and clots per rectum today.   HISTORY OF PRESENT ILLNESS:  Cindy Sullivan is a pleasant 81 year old Caucasian female with past medical history of gastroesophageal reflux disease, anxiety, anemia, and type 2 diabetes who comes to the Emergency Room with complaints of rectal bleed which started this morning around 7:30 a.m.  The patient said she had the urge to have a bowel movement, went to use the bathroom, and started seeing some bright red blood initially mixed with stool along with some clots on her second and third episodes. She is currently hemodynamically stable. She is being admitted for further evaluation and management. Her hemoglobin is 11.1. The patient denies use of any aspirin-like products. She said she was taken off it a long time ago. She also denies using any Motrin or any aspirin products. The patient is being admitted for further evaluation and management.   PAST MEDICAL HISTORY:  1. Type 2 diabetes on insulin.  2. Diabetic gastroparesis and diabetic peripheral neuropathy.  3. Gastroesophageal reflux disease.   4. Anxiety.  5. Chronic anemia.  6. Hypercholesterolemia.  7. Hypertension. 8. Chronic back pain. 9. Abdominal pain. 10. History of leukocytosis, thrombocytosis, and iron deficiency anemia requiring IV iron therapy, followed by Dr. Ma Hillock.  11. History of pancreatitis. 12. Recurrent dizziness. 13. History of chronic diarrhea.   PAST SURGICAL HISTORY:  1. Appendectomy.  2. Cholecystectomy. 3. Port-A-Cath placement.  4. Hernia repair times two. 5. Exploratory laparotomy for lysis of adhesions. 6. Left knee surgery. 7. Hysterectomy with solitary ovarian resection.  ALLERGIES: NSAIDs, codeine, intravenous pyelogram dye, Lipitor, Septra, Tolectin DS.   MEDICATIONS:   1. Amitriptyline 25 mg at bedtime.  2. Crestor 5 mg daily at bedtime.  3. Demerol 50 mg 3 times a day as needed.  4. Irbesartan 300 mg daily.  5. Multivitamin p.o. daily.  6. Novolin 70/30 30 units b.i.d.  7. Xanax 0.5 mg 3 times a day.   FAMILY HISTORY: Positive for hypertension and diabetes. Mother also had lupus.   REVIEW OF SYSTEMS: CONSTITUTIONAL: No fever. Positive for weakness and fatigue.  EYES: No blurred or double vision. ENT: No tinnitus, ear pain, or hearing loss. RESPIRATORY: No cough, wheeze, or hemoptysis. CARDIOVASCULAR: No chest pain, orthopnea, or edema. GI: No nausea, vomiting, diarrhea, abdominal pain. GU: No dysuria or hematuria. ENDOCRINE: No polyuria or nocturia. HEMATOLOGY: Positive for anemia.  SKIN: No acne or rash. MUSCULOSKELETAL: Positive for arthritis. NEURO: No cerebrovascular accident or transient ischemic attack. Positive for peripheral neuropathy. PSYCH: No anxiety or depression. All other systems reviewed and negative.   PHYSICAL EXAMINATION:  GENERAL: The patient is awake, alert, and oriented times three, not in acute distress.   VITAL SIGNS: Afebrile, pulse 98, blood pressure 182/79, sats 99% on room air.   HEENT: Atraumatic, normocephalic. Pupils are equal, round, and reactive to light and accommodation. Extraocular movements intact. Oral mucosa is moist.   NECK: Supple. No JVD. No carotid bruit.   LUNGS: Clear to auscultation bilaterally. No rales, rhonchi, respiratory distress, or labored breathing.   HEART: Both heart sounds are normal. Rate is mildly tachycardic. No murmur heard. PMI is not lateralized. Chest is nontender.   EXTREMITIES: Good pedal pulses, good femoral pulses. No lower extremity edema.   ABDOMEN: Soft. There is some tenderness noted in  the right mid and left lower quadrant, with minimal guarding. No rigidity. No mass felt.   NEURO: Grossly intact cranial nerves II through XII. No motor deficit. The patient does have  peripheral neuropathy.   SKIN: Warm and dry.   PSYCHIATRIC: The patient is awake, alert, and oriented times three.    LABORATORY DATA: Chest x-ray:  No acute changes identified. Troponin 0.02. Glucose 158, BUN 18, creatinine 0.99, sodium 126, potassium 4.2, chloride 88, bicarbonate is 29, calcium 9. LFTs within normal limits. Hemoglobin and hematocrit 11.1 and 33.2. White count 13.6, platelet count is 342. Troponin is 0.02. PT/INR and APTT are pending secondary to redraw. There seems to be an error in reporting. The patient's initial PT-INR has been reported at 13.7 and  PT of 98.9. We will await repeat lab test results.   EKG shows normal sinus rhythm.    ASSESSMENT: 81 year old Cindy Sullivan with:  1. Lower/rectal bleeding, appears diverticular. The patient had a colonoscopy in November 2012 by Dr. Candace Cruise that showed entire colon with TICS. Hemoglobin and hematocrit are stable.  2. Abdominal pain left lower quadrant and right mid abdomen with elevated WBC, possibility of diverticulitis as well. We will add IV antibiotics.  3. Type 2 diabetes, labile. Since the patient is not on p.o. regular diet we will hold off on insulin and do sliding scale insulin.  4. Hypertension.  5. Diabetic gastroparesis and peripheral neuropathy.   PLAN:  1. Admit the patient to the medical floor.  2. Clear liquid diet.  3. IV fluids.  4. We will monitor hemoglobin and hematocrit closely and transfuse as needed.  5. Start patient on Cipro 500 p.o. b.i.d. Add IV metronidazole.  6. GI consultation with Dr. Vira Agar in the morning.  7. Sliding scale insulin for now since the patient is on clear liquids. We will resume insulin 70/30 once she is able to take full liquids and regular diet.   The above was discussed with the patient and her husband who are agreeable to it. Further work-up according to the patient's clinical course. The patient is a FULL CODE.   Again, we will await the repeat APTT, PT, and INR results. There   appears to be an error with the initial ones drawn in the Emergency Room.   TIME SPENT: 50 minutes.   ____________________________ Hart Rochester Posey Pronto, MD sap:bjt D: 02/25/2012 16:36:35 ET T: 02/25/2012 17:32:16 ET JOB#: [id number]  cc: Khushboo Chuck A. Posey Pronto, MD, <Dictator> Vianne Bulls. Arline Asp, MD Ilda Basset MD ELECTRONICALLY SIGNED 03/05/2012 13:55

## 2015-02-16 NOTE — Consult Note (Signed)
PATIENT NAME:  Cindy Sullivan, Cindy Sullivan MR#:  [id number] DATE OF BIRTH:  06-Jun-1929  DATE OF CONSULTATION:  02/26/2012  REFERRING PHYSICIAN:   CONSULTING PHYSICIAN:  Manya Silvas, MD  HISTORY OF PRESENT ILLNESS: The patient is an 81 year old white female who was admitted with lower GI bleeding. She was admitted with lower GI bleeding in November of 2012 and had a colonoscopy performed by Dr. Verdie Shire that showed diverticulosis throughout the colon. She has had previous bleeding episodes in the past. The patient had GI bleeding at that time and required at least 3 units of blood.   She came in with acute onset of rectal bleeding again and some mild lower abdominal tenderness. She was placed on Cipro and Flagyl. Flagyl was IV. The Cipro was oral. She complains bitterly that the Cipro is too large to swallow and when it's crushed up it's very bitter and she asked it to be stopped or at least changed to IV.   The patient has had three bloody passages with this episode of illness but currently the bleeding has stopped and her pain is improved. I was asked to see her in consultation because of her lower GI bleeding.   PAST MEDICAL HISTORY:  1. Type II diabetes, on insulin.  2. Diabetic gastroparesis and peripheral neuropathy.  3. Gastroesophageal reflux disease.  4. Anxiety.  5. Chronic anemia followed at the [facility]. She has a Port-A-Cath in place for transfusions.  6. Hypercholesterolemia.  7. Hypertension.  8. Chronic back pain.  9. Recurrent abdominal pain.  10. Thrombocytosis, iron deficiency anemia, and leukocytosis followed by Dr. Ma Hillock. 11. Pancreatitis.  12. Dizziness.  13. Chronic diarrhea.    PAST SURGICAL HISTORY:  1. Appendectomy. 2. Cholecystectomy. 3. Port-A-Cath placement. 4. Hiatal hernia repair x2. 5. Exploratory lap for lysis of adhesions. 6. Left knee surgery. 7. Hysterectomy with ovarian resection.   ALLERGIES: Nonsteroidal anti-inflammatory drugs, codeine, IVP dye,  Lipitor, Septra, and Tolectin.   MEDICATIONS ON ADMISSION:  1. Amitriptyline 25 mg at bedtime.  2. Crestor 5 mg at bedtime.  3. Demerol 50 mg 3 times a day as needed.  4. Irbesartan 300 mg a day.  5. Multivitamins once daily. 6. Novolin insulin 70/30 30 units b.i.d.  7. Xanax 0.5 mg 3 times a day.   FAMILY HISTORY: Interestingly enough, she has two sons who both have had surgery for diverticular bleed.   PHYSICAL EXAMINATION:   GENERAL: Elderly pleasant white female in no acute distress.   HEENT: Sclerae nonicteric. Conjunctivae negative. Tongue negative. Hands show good pink color.   HEAD: Atraumatic. Trachea is in the midline.   CHEST: Clear. She does have a Port-A-Cath in the right chest.   HEART: No murmurs or gallops I can hear.   ABDOMEN: Very slight tenderness in the left upper abdomen.   VITAL SIGNS: Temperature 97.9, pulse 68, blood pressure 162/83, oxygen sat 94%.   LABORATORY, DIAGNOSTIC, AND RADIOLOGICAL DATA: Glucose 158, BUN 18, creatinine 0.99, sodium 126, potassium 4.2, chloride 88, CO2 29, total protein 8, albumin 3.5, total bilirubin 0.2, alkaline phosphatase 52, SGOT 29, SGPT 22, white blood count 10.7. Hemoglobin was 11.1 yesterday, is 10.9 today. Platelet count 309. A negative blood with negative antibody screen. PT 13. INR 0.9.  ASSESSMENT: Diverticular bleed, cannot rule out the possibility of some mild ischemia or mild diverticulitis. I would go ahead and switch the Cipro back to IV because the patient does not want to take the pill. If she has 48 hours  without bleeding, could consider discharge home. Advised her not to eat seeds, nuts, or popcorn. I see no reason to do colonoscopy at this time since she had one in November.  ____________________________ Manya Silvas, MD rte:drc D: 02/26/2012 09:50:40 ET T: 02/26/2012 10:16:02 ET JOB#: [id number]  cc: Manya Silvas, MD, <Dictator> Vianne Bulls. Arline Asp, MD Manya Silvas MD ELECTRONICALLY SIGNED  03/01/2012 9:26

## 2015-02-16 NOTE — Consult Note (Signed)
Aware of consult, will see in morning.  Electronic Signatures: Manya Silvas (MD)  (Signed on 03-May-13 19:57)  Authored  Last Updated: 03-May-13 19:57 by Manya Silvas (MD)

## 2015-03-03 ENCOUNTER — Inpatient Hospital Stay: Payer: Medicare Other | Attending: Internal Medicine

## 2015-03-03 DIAGNOSIS — D509 Iron deficiency anemia, unspecified: Secondary | ICD-10-CM | POA: Insufficient documentation

## 2015-03-03 DIAGNOSIS — C801 Malignant (primary) neoplasm, unspecified: Secondary | ICD-10-CM

## 2015-03-03 MED ORDER — SODIUM CHLORIDE 0.9 % IJ SOLN
10.0000 mL | INTRAMUSCULAR | Status: AC | PRN
  Administered 2015-03-03: 10 mL via INTRAVENOUS
  Filled 2015-03-03: qty 10

## 2015-03-03 MED ORDER — HEPARIN SOD (PORK) LOCK FLUSH 100 UNIT/ML IV SOLN
500.0000 [IU] | Freq: Once | INTRAVENOUS | Status: AC
  Administered 2015-03-03: 500 [IU] via INTRAVENOUS
  Filled 2015-03-03: qty 5

## 2015-04-07 ENCOUNTER — Other Ambulatory Visit: Payer: Medicare Other

## 2015-04-07 DIAGNOSIS — I10 Essential (primary) hypertension: Secondary | ICD-10-CM | POA: Insufficient documentation

## 2015-04-07 DIAGNOSIS — E871 Hypo-osmolality and hyponatremia: Secondary | ICD-10-CM | POA: Insufficient documentation

## 2015-04-07 DIAGNOSIS — N183 Chronic kidney disease, stage 3 unspecified: Secondary | ICD-10-CM | POA: Insufficient documentation

## 2015-04-25 ENCOUNTER — Inpatient Hospital Stay: Payer: Medicare Other | Attending: Internal Medicine

## 2015-04-25 DIAGNOSIS — D509 Iron deficiency anemia, unspecified: Secondary | ICD-10-CM | POA: Diagnosis not present

## 2015-04-25 DIAGNOSIS — Z452 Encounter for adjustment and management of vascular access device: Secondary | ICD-10-CM | POA: Diagnosis not present

## 2015-04-25 DIAGNOSIS — C801 Malignant (primary) neoplasm, unspecified: Secondary | ICD-10-CM

## 2015-04-25 MED ORDER — HEPARIN SOD (PORK) LOCK FLUSH 100 UNIT/ML IV SOLN
500.0000 [IU] | Freq: Once | INTRAVENOUS | Status: AC
Start: 2015-04-25 — End: 2015-04-25
  Administered 2015-04-25: 500 [IU] via INTRAVENOUS

## 2015-04-25 MED ORDER — HEPARIN SOD (PORK) LOCK FLUSH 100 UNIT/ML IV SOLN
INTRAVENOUS | Status: AC
  Filled 2015-04-25: qty 5

## 2015-04-25 MED ORDER — SODIUM CHLORIDE 0.9 % IJ SOLN
10.0000 mL | INTRAMUSCULAR | Status: DC | PRN
  Administered 2015-04-25: 10 mL via INTRAVENOUS
  Filled 2015-04-25: qty 10

## 2015-06-05 ENCOUNTER — Inpatient Hospital Stay: Payer: Medicare Other | Attending: Internal Medicine

## 2015-06-05 DIAGNOSIS — C801 Malignant (primary) neoplasm, unspecified: Secondary | ICD-10-CM

## 2015-06-05 DIAGNOSIS — D509 Iron deficiency anemia, unspecified: Secondary | ICD-10-CM | POA: Insufficient documentation

## 2015-06-05 DIAGNOSIS — Z452 Encounter for adjustment and management of vascular access device: Secondary | ICD-10-CM | POA: Diagnosis not present

## 2015-06-05 MED ORDER — HEPARIN SOD (PORK) LOCK FLUSH 100 UNIT/ML IV SOLN
INTRAVENOUS | Status: AC
  Filled 2015-06-05: qty 5

## 2015-06-05 MED ORDER — HEPARIN SOD (PORK) LOCK FLUSH 100 UNIT/ML IV SOLN
500.0000 [IU] | Freq: Once | INTRAVENOUS | Status: AC
  Administered 2015-06-05: 500 [IU] via INTRAVENOUS

## 2015-06-05 MED ORDER — SODIUM CHLORIDE 0.9 % IJ SOLN
10.0000 mL | INTRAMUSCULAR | Status: DC | PRN
  Administered 2015-06-05: 10 mL via INTRAVENOUS
  Filled 2015-06-05: qty 10

## 2015-07-10 ENCOUNTER — Emergency Department
Admission: EM | Admit: 2015-07-10 | Discharge: 2015-07-10 | Disposition: A | Discharge status: Home / Self Care | Payer: 59

## 2015-08-11 ENCOUNTER — Other Ambulatory Visit: Payer: Self-pay | Admitting: *Deleted

## 2015-08-11 ENCOUNTER — Inpatient Hospital Stay: Payer: Medicare Other

## 2015-08-11 ENCOUNTER — Inpatient Hospital Stay: Payer: Medicare Other | Attending: Internal Medicine

## 2015-08-11 ENCOUNTER — Other Ambulatory Visit: Payer: Medicare Other

## 2015-08-11 DIAGNOSIS — D509 Iron deficiency anemia, unspecified: Secondary | ICD-10-CM

## 2015-08-11 DIAGNOSIS — Z452 Encounter for adjustment and management of vascular access device: Secondary | ICD-10-CM | POA: Diagnosis not present

## 2015-08-11 LAB — IRON AND TIBC
Iron: 59 ug/dL (ref 28–170)
SATURATION RATIOS: 22 % (ref 10.4–31.8)
TIBC: 274 ug/dL (ref 250–450)
UIBC: 215 ug/dL

## 2015-08-11 LAB — HEMOGLOBIN: Hemoglobin: 12.7 g/dL (ref 12.0–16.0)

## 2015-08-11 LAB — FERRITIN: FERRITIN: 244 ng/mL (ref 11–307)

## 2015-08-11 MED ORDER — SODIUM CHLORIDE 0.9 % IJ SOLN
10.0000 mL | INTRAMUSCULAR | Status: DC | PRN
  Administered 2015-08-11: 10 mL
  Filled 2015-08-11: qty 10

## 2015-08-11 MED ORDER — HEPARIN SOD (PORK) LOCK FLUSH 100 UNIT/ML IV SOLN
500.0000 [IU] | Freq: Once | INTRAVENOUS | Status: AC
  Administered 2015-08-11: 500 [IU] via INTRAVENOUS
  Filled 2015-08-11: qty 5

## 2015-12-15 ENCOUNTER — Ambulatory Visit: Payer: Medicare Other | Admitting: Internal Medicine

## 2015-12-15 ENCOUNTER — Other Ambulatory Visit: Payer: Medicare Other

## 2015-12-19 ENCOUNTER — Encounter: Payer: Self-pay | Admitting: *Deleted

## 2015-12-22 ENCOUNTER — Inpatient Hospital Stay: Payer: Medicare Other

## 2015-12-22 ENCOUNTER — Inpatient Hospital Stay: Payer: Medicare Other | Admitting: Internal Medicine

## 2015-12-29 ENCOUNTER — Inpatient Hospital Stay: Payer: Medicare Other

## 2015-12-29 ENCOUNTER — Inpatient Hospital Stay: Payer: Medicare Other | Attending: Internal Medicine | Admitting: Internal Medicine

## 2015-12-29 VITALS — BP 124/75 | HR 83 | Temp 97.4°F

## 2015-12-29 DIAGNOSIS — D473 Essential (hemorrhagic) thrombocythemia: Secondary | ICD-10-CM

## 2015-12-29 DIAGNOSIS — K219 Gastro-esophageal reflux disease without esophagitis: Secondary | ICD-10-CM | POA: Diagnosis not present

## 2015-12-29 DIAGNOSIS — Z8719 Personal history of other diseases of the digestive system: Secondary | ICD-10-CM | POA: Diagnosis not present

## 2015-12-29 DIAGNOSIS — D72829 Elevated white blood cell count, unspecified: Secondary | ICD-10-CM | POA: Diagnosis not present

## 2015-12-29 DIAGNOSIS — E119 Type 2 diabetes mellitus without complications: Secondary | ICD-10-CM

## 2015-12-29 DIAGNOSIS — I10 Essential (primary) hypertension: Secondary | ICD-10-CM | POA: Diagnosis not present

## 2015-12-29 DIAGNOSIS — D509 Iron deficiency anemia, unspecified: Secondary | ICD-10-CM

## 2015-12-29 DIAGNOSIS — Z794 Long term (current) use of insulin: Secondary | ICD-10-CM | POA: Diagnosis not present

## 2015-12-29 DIAGNOSIS — D508 Other iron deficiency anemias: Secondary | ICD-10-CM

## 2015-12-29 DIAGNOSIS — M8929 Other disorders of bone development and growth, multiple sites: Secondary | ICD-10-CM | POA: Diagnosis not present

## 2015-12-29 DIAGNOSIS — Z79899 Other long term (current) drug therapy: Secondary | ICD-10-CM | POA: Diagnosis not present

## 2015-12-29 DIAGNOSIS — M549 Dorsalgia, unspecified: Secondary | ICD-10-CM

## 2015-12-29 DIAGNOSIS — E785 Hyperlipidemia, unspecified: Secondary | ICD-10-CM

## 2015-12-29 DIAGNOSIS — Z95828 Presence of other vascular implants and grafts: Secondary | ICD-10-CM

## 2015-12-29 LAB — CBC WITH DIFFERENTIAL/PLATELET
BASOS PCT: 1 %
Basophils Absolute: 0.1 10*3/uL (ref 0–0.1)
EOS ABS: 0.2 10*3/uL (ref 0–0.7)
Eosinophils Relative: 2 %
HCT: 34 % — ABNORMAL LOW (ref 35.0–47.0)
HEMOGLOBIN: 11.4 g/dL — AB (ref 12.0–16.0)
Lymphocytes Relative: 31 %
Lymphs Abs: 3.9 10*3/uL — ABNORMAL HIGH (ref 1.0–3.6)
MCH: 28.5 pg (ref 26.0–34.0)
MCHC: 33.5 g/dL (ref 32.0–36.0)
MCV: 85.1 fL (ref 80.0–100.0)
MONOS PCT: 9 %
Monocytes Absolute: 1.1 10*3/uL — ABNORMAL HIGH (ref 0.2–0.9)
NEUTROS PCT: 57 %
Neutro Abs: 7.4 10*3/uL — ABNORMAL HIGH (ref 1.4–6.5)
PLATELETS: 434 10*3/uL (ref 150–440)
RBC: 4 MIL/uL (ref 3.80–5.20)
RDW: 14.4 % (ref 11.5–14.5)
WBC: 12.6 10*3/uL — AB (ref 3.6–11.0)

## 2015-12-29 LAB — IRON AND TIBC
IRON: 56 ug/dL (ref 28–170)
Saturation Ratios: 24 % (ref 10.4–31.8)
TIBC: 237 ug/dL — ABNORMAL LOW (ref 250–450)
UIBC: 181 ug/dL

## 2015-12-29 LAB — FERRITIN: FERRITIN: 295 ng/mL (ref 11–307)

## 2015-12-29 MED ORDER — HEPARIN SOD (PORK) LOCK FLUSH 100 UNIT/ML IV SOLN
500.0000 [IU] | Freq: Once | INTRAVENOUS | Status: AC
  Administered 2015-12-29: 500 [IU] via INTRAVENOUS

## 2015-12-29 MED ORDER — SODIUM CHLORIDE 0.9% FLUSH
10.0000 mL | INTRAVENOUS | Status: DC | PRN
Start: 2015-12-29 — End: 2015-12-29
  Administered 2015-12-29: 10 mL via INTRAVENOUS
  Filled 2015-12-29: qty 10

## 2015-12-29 NOTE — Progress Notes (Signed)
[facility] OFFICE PROGRESS NOTE  Patient Care Team: Glendon Axe, MD as PCP - General (Internal Medicine)   SUMMARY OF ONCOLOGIC HISTORY:  # CHRONIC IRON DEFICIENCY ANEMIA- ? 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   INTERVAL HISTORY:  This is my first interaction with the patient since I joined the practice September 2016. I reviewed the patient's prior charts/pertinent labs/imaging in detail; findings are summarized above.   82 year old female patient with above history of anemia likely secondary to iron deficiency with intermittent IV iron infusion is here for follow-up. Patient's last IV iron has been at least more than a year. Patient's intolerant of by mouth iron.  Patient has chronic arthritis;    REVIEW OF SYSTEMS:  A complete 10 point review of system is done which is negative except mentioned above/history of present illness.   PAST MEDICAL HISTORY :  Past Medical History  Diagnosis Date  . IDA (iron deficiency anemia)   . GERD (gastroesophageal reflux disease)   . Hyperlipidemia   . Hypertension   . Non-insulin dependent type 2 diabetes mellitus ([place])   . Pancreatitis   . H/O: GI bleed   . Chronic diarrhea   . Chest pain syndrome   . Insomnia   . Chronic back pain     PAST SURGICAL HISTORY :   Past Surgical History  Procedure Laterality Date  . Cholecystectomy    . Appendectomy    . Hernia repair      x 2  . Exploratory laparotomy    . Knee arthroscopy Left   . Total abdominal hysterectomy w/ bilateral salpingoophorectomy      one ovary still left    FAMILY HISTORY :  No family history on file.  SOCIAL HISTORY:   Social History  Substance Use Topics  . Smoking status: Never Smoker   . Smokeless tobacco: Never Used  . Alcohol Use: Not on file    ALLERGIES:  has no allergies on file.  MEDICATIONS:  Current Outpatient  Prescriptions  Medication Sig Dispense Refill  . acetaminophen (TYLENOL) 500 MG tablet Take 500 mg by mouth 2 (two) times daily.    Marland Kitchen atorvastatin (LIPITOR) 20 MG tablet Take 20 mg by mouth once.    . docusate sodium (COLACE) 100 MG capsule Take 100 mg by mouth 2 (two) times daily.    Marland Kitchen glucose blood (BAYER CONTOUR TEST) test strip     . INS SYRINGE/NEEDLE 1CC/28G 28G X 1/2" 1 ML MISC     . insulin NPH-regular Human (NOVOLIN 70/30) (70-30) 100 UNIT/ML injection     . insulin regular (NOVOLIN R,HUMULIN R) 100 units/mL injection     . Insulin Syringe-Needle U-100 (B-D INS SYRINGE 0.5CC/31GX5/16) 31G X 5/16" 0.5 ML MISC     . irbesartan (AVAPRO) 300 MG tablet 300 mg daily.    Marland Kitchen loperamide (IMODIUM) 2 MG capsule Take by mouth.    . memantine (NAMENDA) 5 MG tablet 5 mg daily.    Marland Kitchen omeprazole (PRILOSEC) 40 MG capsule 40 mg daily.    Marland Kitchen oxybutynin (DITROPAN-XL) 5 MG 24 hr tablet 5 mg daily.    . temazepam (RESTORIL) 7.5 MG capsule 7.5 mg Nightly.    . traMADol (ULTRAM) 50 MG tablet Take 50 mg by mouth as needed.    Festus Barren THIN LANCETS MISC      No current facility-administered medications for this visit.   Facility-Administered Medications Ordered in Other Visits  Medication Dose Route Frequency Provider Last Rate Last Dose  . sodium chloride 0.9 % injection 10 mL  10 mL Intravenous PRN Leia Alf, MD   10 mL at 03/03/15 1528    PHYSICAL EXAMINATION:   BP 124/75 mmHg  Pulse 83  Temp(Src) 97.4 F (36.3 C) (Tympanic)  SpO2 97%  There were no vitals filed for this visit.  GENERAL: Well-nourished well-developed; Alert, no distress and comfortable.  She is in a wheel chair. Accompanied by family.  EYES: no pallor or icterus OROPHARYNX: no thrush or ulceration; good dentition  NECK: supple, no masses felt LYMPH:  no palpable lymphadenopathy in the cervical, axillary or inguinal regions LUNGS: clear to auscultation and  No wheeze or crackles HEART/CVS: regular rate & rhythm and  no murmurs; No lower extremity edema ABDOMEN:abdomen soft, non-tender and normal bowel sounds Musculoskeletal:no cyanosis of digits and no clubbing  PSYCH: alert & oriented x 3 with fluent speech NEURO: no focal motor/sensory deficits SKIN:  no rashes or significant lesions  LABORATORY DATA:  I have reviewed the data as listed    Component Value Date/Time   NA 134* 05/20/2014 0533   K 3.5 05/20/2014 0533   CL 102 05/20/2014 0533   CO2 23 05/20/2014 0533   GLUCOSE 90 05/20/2014 0533   BUN 23* 05/20/2014 0533   CREATININE 1.12 05/20/2014 0533   CALCIUM 8.0* 05/20/2014 0533   PROT 6.7 05/17/2014 1852   PROT 6.7 05/17/2014 1852   ALBUMIN 2.2* 05/17/2014 1852   ALBUMIN 2.2* 05/17/2014 1852   AST 117* 05/17/2014 1852   AST 116* 05/17/2014 1852   ALT 29 05/17/2014 1852   ALT 29 05/17/2014 1852   ALKPHOS 64 05/17/2014 1852   ALKPHOS 65 05/17/2014 1852   BILITOT 0.3 05/17/2014 1852   BILITOT 0.3 05/17/2014 1852   GFRNONAA 45* 05/20/2014 0533   GFRNONAA 53* 01/25/2012 1447   GFRAA 53* 05/20/2014 0533   GFRAA >60 01/25/2012 1447    No results found for: SPEP, UPEP  Lab Results  Component Value Date   WBC 12.6* 12/29/2015   NEUTROABS 7.4* 12/29/2015   HGB 11.4* 12/29/2015   HCT 34.0* 12/29/2015   MCV 85.1 12/29/2015   PLT 434 12/29/2015      Chemistry      Component Value Date/Time   NA 134* 05/20/2014 0533   K 3.5 05/20/2014 0533   CL 102 05/20/2014 0533   CO2 23 05/20/2014 0533   BUN 23* 05/20/2014 0533   CREATININE 1.12 05/20/2014 0533      Component Value Date/Time   CALCIUM 8.0* 05/20/2014 0533   ALKPHOS 64 05/17/2014 1852   ALKPHOS 65 05/17/2014 1852   AST 117* 05/17/2014 1852   AST 116* 05/17/2014 1852   ALT 29 05/17/2014 1852   ALT 29 05/17/2014 1852   BILITOT 0.3 05/17/2014 1852   BILITOT 0.3 05/17/2014 1852       ASSESSMENT & PLAN:   # Anemia normocytic- hemoglobin 11.3 today. Question iron deficiency versus other causes like chronic kidney  disease. Since hemoglobin is stable; she will not need any IV iron ; unless ferritin is low. Ferritin iron studies from today are pending.  # If patient's hemoglobin continues to drop; in spite of adequate iron levels- other etiologies like chronic kidney disease versus primary bone marrow disease should be considered. For now we'll hold off any bone marrow biopsy- unless her hemoglobin continues to drop.   # I would recommend a follow-up in 6 months; continue port  flushes every 6 weeks.   # 15 minutes face-to-face with the patient discussing the above plan of care; more than 50% of time spent on natural history; counseling and coordination.     Cammie Sickle, MD 12/29/2015 2:51 PM

## 2015-12-30 DIAGNOSIS — D500 Iron deficiency anemia secondary to blood loss (chronic): Secondary | ICD-10-CM | POA: Insufficient documentation

## 2016-01-18 DIAGNOSIS — G301 Alzheimer's disease with late onset: Secondary | ICD-10-CM

## 2016-01-18 DIAGNOSIS — F0280 Dementia in other diseases classified elsewhere without behavioral disturbance: Secondary | ICD-10-CM | POA: Insufficient documentation

## 2016-01-18 DIAGNOSIS — E119 Type 2 diabetes mellitus without complications: Secondary | ICD-10-CM | POA: Insufficient documentation

## 2016-02-09 ENCOUNTER — Inpatient Hospital Stay: Payer: Medicare Other | Attending: Internal Medicine

## 2016-02-09 DIAGNOSIS — Z452 Encounter for adjustment and management of vascular access device: Secondary | ICD-10-CM | POA: Diagnosis not present

## 2016-02-09 DIAGNOSIS — D509 Iron deficiency anemia, unspecified: Secondary | ICD-10-CM | POA: Diagnosis not present

## 2016-02-09 DIAGNOSIS — Z95828 Presence of other vascular implants and grafts: Secondary | ICD-10-CM

## 2016-02-09 MED ORDER — SODIUM CHLORIDE 0.9% FLUSH
10.0000 mL | INTRAVENOUS | Status: DC | PRN
  Administered 2016-02-09: 10 mL via INTRAVENOUS
  Filled 2016-02-09: qty 10

## 2016-02-09 MED ORDER — HEPARIN SOD (PORK) LOCK FLUSH 100 UNIT/ML IV SOLN
500.0000 [IU] | Freq: Once | INTRAVENOUS | Status: AC
  Administered 2016-02-09: 500 [IU] via INTRAVENOUS

## 2016-03-23 ENCOUNTER — Telehealth: Payer: Self-pay | Admitting: Internal Medicine

## 2016-03-23 ENCOUNTER — Inpatient Hospital Stay: Payer: Medicare Other

## 2016-03-23 ENCOUNTER — Other Ambulatory Visit: Payer: Self-pay | Admitting: *Deleted

## 2016-03-23 DIAGNOSIS — Z95828 Presence of other vascular implants and grafts: Secondary | ICD-10-CM

## 2016-03-23 MED ORDER — LIDOCAINE-PRILOCAINE 2.5-2.5 % EX CREA: 1.0000 "application " | TOPICAL_CREAM | CUTANEOUS | Status: DC | PRN

## 2016-03-23 NOTE — Telephone Encounter (Signed)
emla cream rx sent to phamacy. Pt's family made aware-pt currently unavailable.  Pt has port a cath flush planned for next week.  Brother states he will pick up the emla cream today.

## 2016-03-23 NOTE — Telephone Encounter (Signed)
They cancelled her port flush because she doesn't have numbing cream yet. Can you please send Rx for numbing cream so she won't have so much pain when they access her port? Thanks!

## 2016-03-31 ENCOUNTER — Inpatient Hospital Stay: Payer: Medicare Other | Attending: Internal Medicine

## 2016-03-31 DIAGNOSIS — Z452 Encounter for adjustment and management of vascular access device: Secondary | ICD-10-CM | POA: Diagnosis not present

## 2016-03-31 DIAGNOSIS — D509 Iron deficiency anemia, unspecified: Secondary | ICD-10-CM | POA: Insufficient documentation

## 2016-03-31 DIAGNOSIS — D508 Other iron deficiency anemias: Secondary | ICD-10-CM

## 2016-03-31 MED ORDER — HEPARIN SOD (PORK) LOCK FLUSH 100 UNIT/ML IV SOLN
INTRAVENOUS | Status: AC
  Filled 2016-03-31: qty 5

## 2016-03-31 MED ORDER — SODIUM CHLORIDE 0.9% FLUSH
10.0000 mL | Freq: Once | INTRAVENOUS | Status: AC
  Administered 2016-03-31: 10 mL via INTRAVENOUS
  Filled 2016-03-31: qty 10

## 2016-03-31 MED ORDER — HEPARIN SOD (PORK) LOCK FLUSH 100 UNIT/ML IV SOLN
500.0000 [IU] | Freq: Once | INTRAVENOUS | Status: AC
  Administered 2016-03-31: 500 [IU] via INTRAVENOUS

## 2016-04-13 ENCOUNTER — Inpatient Hospital Stay
Admission: EM | Admit: 2016-04-13 | Discharge: 2016-04-17 | DRG: 690 | Disposition: A | Discharge status: Skilled Nursing Facility | Payer: Medicare Other | Attending: Internal Medicine | Admitting: Internal Medicine

## 2016-04-13 ENCOUNTER — Encounter: Payer: Self-pay | Admitting: Emergency Medicine

## 2016-04-13 ENCOUNTER — Emergency Department: Payer: Medicare Other

## 2016-04-13 DIAGNOSIS — E871 Hypo-osmolality and hyponatremia: Secondary | ICD-10-CM

## 2016-04-13 DIAGNOSIS — M549 Dorsalgia, unspecified: Secondary | ICD-10-CM | POA: Diagnosis present

## 2016-04-13 DIAGNOSIS — N390 Urinary tract infection, site not specified: Secondary | ICD-10-CM | POA: Diagnosis not present

## 2016-04-13 DIAGNOSIS — E860 Dehydration: Secondary | ICD-10-CM | POA: Diagnosis present

## 2016-04-13 DIAGNOSIS — Z79899 Other long term (current) drug therapy: Secondary | ICD-10-CM

## 2016-04-13 DIAGNOSIS — Z888 Allergy status to other drugs, medicaments and biological substances status: Secondary | ICD-10-CM

## 2016-04-13 DIAGNOSIS — Z885 Allergy status to narcotic agent status: Secondary | ICD-10-CM

## 2016-04-13 DIAGNOSIS — E861 Hypovolemia: Secondary | ICD-10-CM | POA: Diagnosis present

## 2016-04-13 DIAGNOSIS — Z7984 Long term (current) use of oral hypoglycemic drugs: Secondary | ICD-10-CM

## 2016-04-13 DIAGNOSIS — Z90710 Acquired absence of both cervix and uterus: Secondary | ICD-10-CM

## 2016-04-13 DIAGNOSIS — Z87440 Personal history of urinary (tract) infections: Secondary | ICD-10-CM

## 2016-04-13 DIAGNOSIS — K219 Gastro-esophageal reflux disease without esophagitis: Secondary | ICD-10-CM | POA: Diagnosis present

## 2016-04-13 DIAGNOSIS — Z794 Long term (current) use of insulin: Secondary | ICD-10-CM

## 2016-04-13 DIAGNOSIS — E119 Type 2 diabetes mellitus without complications: Secondary | ICD-10-CM | POA: Diagnosis present

## 2016-04-13 DIAGNOSIS — G8929 Other chronic pain: Secondary | ICD-10-CM | POA: Diagnosis present

## 2016-04-13 DIAGNOSIS — D509 Iron deficiency anemia, unspecified: Secondary | ICD-10-CM | POA: Diagnosis present

## 2016-04-13 DIAGNOSIS — E785 Hyperlipidemia, unspecified: Secondary | ICD-10-CM | POA: Diagnosis present

## 2016-04-13 DIAGNOSIS — N289 Disorder of kidney and ureter, unspecified: Secondary | ICD-10-CM

## 2016-04-13 DIAGNOSIS — I10 Essential (primary) hypertension: Secondary | ICD-10-CM | POA: Diagnosis present

## 2016-04-13 DIAGNOSIS — N179 Acute kidney failure, unspecified: Secondary | ICD-10-CM | POA: Diagnosis present

## 2016-04-13 DIAGNOSIS — R000 Tachycardia, unspecified: Secondary | ICD-10-CM | POA: Diagnosis present

## 2016-04-13 DIAGNOSIS — B965 Pseudomonas (aeruginosa) (mallei) (pseudomallei) as the cause of diseases classified elsewhere: Secondary | ICD-10-CM | POA: Diagnosis present

## 2016-04-13 DIAGNOSIS — F0390 Unspecified dementia without behavioral disturbance: Secondary | ICD-10-CM | POA: Diagnosis present

## 2016-04-13 LAB — CBC
HEMATOCRIT: 36.7 % (ref 35.0–47.0)
Hemoglobin: 12 g/dL (ref 12.0–16.0)
MCH: 28.3 pg (ref 26.0–34.0)
MCHC: 32.8 g/dL (ref 32.0–36.0)
MCV: 86.2 fL (ref 80.0–100.0)
PLATELETS: 426 10*3/uL (ref 150–440)
RBC: 4.26 MIL/uL (ref 3.80–5.20)
RDW: 14.5 % (ref 11.5–14.5)
WBC: 13.3 10*3/uL — AB (ref 3.6–11.0)

## 2016-04-13 LAB — GLUCOSE, CAPILLARY
GLUCOSE-CAPILLARY: 162 mg/dL — AB (ref 65–99)
Glucose-Capillary: 107 mg/dL — ABNORMAL HIGH (ref 65–99)

## 2016-04-13 LAB — BASIC METABOLIC PANEL
ANION GAP: 8 (ref 5–15)
BUN: 19 mg/dL (ref 6–20)
CALCIUM: 8.7 mg/dL — AB (ref 8.9–10.3)
CO2: 25 mmol/L (ref 22–32)
CREATININE: 1.14 mg/dL — AB (ref 0.44–1.00)
Chloride: 96 mmol/L — ABNORMAL LOW (ref 101–111)
GFR calc Af Amer: 49 mL/min — ABNORMAL LOW (ref >60)
GFR, EST NON AFRICAN AMERICAN: 43 mL/min — AB (ref >60)
GLUCOSE: 177 mg/dL — AB (ref 65–99)
Potassium: 4.9 mmol/L (ref 3.5–5.1)
Sodium: 129 mmol/L — ABNORMAL LOW (ref 135–145)

## 2016-04-13 LAB — URINALYSIS COMPLETE WITH MICROSCOPIC (ARMC ONLY)
Bilirubin Urine: NEGATIVE
Glucose, UA: NEGATIVE mg/dL
KETONES UR: NEGATIVE mg/dL
NITRITE: NEGATIVE
PH: 7 (ref 5.0–8.0)
PROTEIN: NEGATIVE mg/dL
SPECIFIC GRAVITY, URINE: 1.004 — AB (ref 1.005–1.030)
Squamous Epithelial / LPF: NONE SEEN

## 2016-04-13 MED ORDER — ACETAMINOPHEN 650 MG RE SUPP: 650.0000 mg | Freq: Four times a day (QID) | RECTAL | Status: DC | PRN

## 2016-04-13 MED ORDER — SODIUM CHLORIDE 0.9 % IV BOLUS (SEPSIS)
1000.0000 mL | Freq: Once | INTRAVENOUS | Status: AC
  Administered 2016-04-13: 1000 mL via INTRAVENOUS

## 2016-04-13 MED ORDER — IRBESARTAN 75 MG PO TABS
150.0000 mg | ORAL_TABLET | Freq: Every day | ORAL | Status: DC
  Administered 2016-04-14 – 2016-04-17 (×4): 150 mg via ORAL
  Filled 2016-04-13 (×5): qty 2

## 2016-04-13 MED ORDER — SODIUM CHLORIDE 0.9 % IV SOLN
INTRAVENOUS | Status: DC
  Administered 2016-04-13 – 2016-04-17 (×6): via INTRAVENOUS

## 2016-04-13 MED ORDER — ONDANSETRON HCL 4 MG/2ML IJ SOLN
4.0000 mg | Freq: Once | INTRAMUSCULAR | Status: AC
  Administered 2016-04-13: 4 mg via INTRAVENOUS
  Filled 2016-04-13: qty 2

## 2016-04-13 MED ORDER — MORPHINE SULFATE (PF) 2 MG/ML IV SOLN
INTRAVENOUS | Status: AC
  Filled 2016-04-13: qty 1

## 2016-04-13 MED ORDER — MEMANTINE HCL 5 MG PO TABS
5.0000 mg | ORAL_TABLET | Freq: Two times a day (BID) | ORAL | Status: DC
Start: 2016-04-13 — End: 2016-04-17
  Administered 2016-04-13 – 2016-04-17 (×8): 5 mg via ORAL
  Filled 2016-04-13 (×8): qty 1

## 2016-04-13 MED ORDER — TRAMADOL HCL 50 MG PO TABS
50.0000 mg | ORAL_TABLET | Freq: Once | ORAL | Status: AC
  Administered 2016-04-13: 50 mg via ORAL
  Filled 2016-04-13: qty 1

## 2016-04-13 MED ORDER — TRAMADOL HCL 50 MG PO TABS
50.0000 mg | ORAL_TABLET | Freq: Four times a day (QID) | ORAL | Status: DC | PRN
  Administered 2016-04-13 – 2016-04-17 (×7): 50 mg via ORAL
  Filled 2016-04-13 (×7): qty 1

## 2016-04-13 MED ORDER — PIPERACILLIN-TAZOBACTAM 3.375 G IVPB 30 MIN
3.3750 g | Freq: Once | INTRAVENOUS | Status: AC
  Administered 2016-04-13: 3.375 g via INTRAVENOUS
  Filled 2016-04-13: qty 50

## 2016-04-13 MED ORDER — SODIUM CHLORIDE 0.9 % IV SOLN
INTRAVENOUS | Status: DC
Start: 2016-04-13 — End: 2016-04-13

## 2016-04-13 MED ORDER — INSULIN ASPART PROT & ASPART (70-30 MIX) 100 UNIT/ML ~~LOC~~ SUSP
10.0000 [IU] | Freq: Every day | SUBCUTANEOUS | Status: DC
Start: 2016-04-14 — End: 2016-04-17
  Administered 2016-04-14 – 2016-04-17 (×4): 10 [IU] via SUBCUTANEOUS
  Filled 2016-04-13 (×4): qty 10

## 2016-04-13 MED ORDER — ACETAMINOPHEN 500 MG PO TABS
500.0000 mg | ORAL_TABLET | Freq: Three times a day (TID) | ORAL | Status: DC
  Administered 2016-04-13 – 2016-04-17 (×11): 500 mg via ORAL
  Filled 2016-04-13 (×11): qty 1

## 2016-04-13 MED ORDER — ONDANSETRON HCL 4 MG PO TABS: 4.0000 mg | ORAL_TABLET | Freq: Four times a day (QID) | ORAL | Status: DC | PRN

## 2016-04-13 MED ORDER — OXYBUTYNIN CHLORIDE ER 5 MG PO TB24
5.0000 mg | ORAL_TABLET | Freq: Every day | ORAL | Status: DC
  Administered 2016-04-13 – 2016-04-16 (×4): 5 mg via ORAL
  Filled 2016-04-13 (×4): qty 1

## 2016-04-13 MED ORDER — INSULIN ASPART 100 UNIT/ML ~~LOC~~ SOLN: 0.0000 [IU] | Freq: Every day | SUBCUTANEOUS | Status: DC

## 2016-04-13 MED ORDER — ONDANSETRON HCL 4 MG/2ML IJ SOLN
4.0000 mg | Freq: Four times a day (QID) | INTRAMUSCULAR | Status: DC | PRN
  Administered 2016-04-15: 4 mg via INTRAVENOUS
  Filled 2016-04-13: qty 2

## 2016-04-13 MED ORDER — TEMAZEPAM 7.5 MG PO CAPS
7.5000 mg | ORAL_CAPSULE | Freq: Every evening | ORAL | Status: DC | PRN
  Administered 2016-04-14 – 2016-04-15 (×2): 7.5 mg via ORAL
  Filled 2016-04-13 (×2): qty 1

## 2016-04-13 MED ORDER — PANTOPRAZOLE SODIUM 40 MG PO TBEC
40.0000 mg | DELAYED_RELEASE_TABLET | Freq: Every day | ORAL | Status: DC
Start: 2016-04-13 — End: 2016-04-17
  Administered 2016-04-13 – 2016-04-17 (×5): 40 mg via ORAL
  Filled 2016-04-13 (×5): qty 1

## 2016-04-13 MED ORDER — ACETAMINOPHEN 325 MG PO TABS: 650.0000 mg | ORAL_TABLET | Freq: Four times a day (QID) | ORAL | Status: DC | PRN

## 2016-04-13 MED ORDER — ENOXAPARIN SODIUM 30 MG/0.3ML ~~LOC~~ SOLN
30.0000 mg | SUBCUTANEOUS | Status: DC
  Administered 2016-04-13: 22:00:00 30 mg via SUBCUTANEOUS
  Filled 2016-04-13: qty 0.3

## 2016-04-13 MED ORDER — MORPHINE SULFATE (PF) 2 MG/ML IV SOLN
1.0000 mg | INTRAVENOUS | Status: DC | PRN
  Administered 2016-04-13: 1 mg via INTRAVENOUS

## 2016-04-13 MED ORDER — INSULIN ASPART 100 UNIT/ML ~~LOC~~ SOLN
0.0000 [IU] | Freq: Three times a day (TID) | SUBCUTANEOUS | Status: DC
  Administered 2016-04-14: 1 [IU] via SUBCUTANEOUS
  Administered 2016-04-15: 2 [IU] via SUBCUTANEOUS
  Administered 2016-04-15: 09:00:00 1 [IU] via SUBCUTANEOUS
  Filled 2016-04-13 (×2): qty 1
  Filled 2016-04-13: qty 2

## 2016-04-13 MED ORDER — DEXTROSE 5 % IV SOLN
1.0000 g | INTRAVENOUS | Status: DC
  Administered 2016-04-13 – 2016-04-14 (×2): 1 g via INTRAVENOUS
  Filled 2016-04-13 (×3): qty 10

## 2016-04-13 MED ORDER — ATORVASTATIN CALCIUM 20 MG PO TABS
20.0000 mg | ORAL_TABLET | Freq: Every day | ORAL | Status: DC
  Administered 2016-04-13 – 2016-04-16 (×4): 20 mg via ORAL
  Filled 2016-04-13 (×4): qty 1

## 2016-04-13 NOTE — ED Provider Notes (Signed)
[facility] Emergency Department Provider Note  ____________________________________________  Time seen: Approximately 4:45 PM  I have reviewed the triage vital signs and the nursing notes.   HISTORY  Chief Complaint Abnormal Lab    HPI Cindy Sullivan is a 82 y.o. female with a history of recurrent UTIs presenting from her primary care physician's office for "I need IV antibiotics." Patient reports that in the last several weeks she has completed 3 entire courses of different oral antibiotics for dysuria. Each time she gets "a little better" but continues to be in dramatic and have abnormal urinalyses. The patient has had some mild nausea without vomiting and denies any fever or chills. She also has some diffuse nonfocal abdominal pain.   Past Medical History  Diagnosis Date  . IDA (iron deficiency anemia)   . GERD (gastroesophageal reflux disease)   . Hyperlipidemia   . Hypertension   . Non-insulin dependent type 2 diabetes mellitus ([place])   . Pancreatitis   . H/O: GI bleed   . Chronic diarrhea   . Chest pain syndrome   . Insomnia   . Chronic back pain     Patient Active Problem List   Diagnosis Date Noted  . Other iron deficiency anemias 12/30/2015    Past Surgical History  Procedure Laterality Date  . Cholecystectomy    . Appendectomy    . Hernia repair      x 2  . Exploratory laparotomy    . Knee arthroscopy Left   . Total abdominal hysterectomy w/ bilateral salpingoophorectomy      one ovary still left    Current Outpatient Rx  Name  Route  Sig  Dispense  Refill  . acetaminophen (TYLENOL) 500 MG tablet   Oral   Take 500 mg by mouth 2 (two) times daily.         Marland Kitchen atorvastatin (LIPITOR) 20 MG tablet   Oral   Take 20 mg by mouth once.         . docusate sodium (COLACE) 100 MG capsule   Oral   Take 100 mg by mouth 2 (two) times daily.         Marland Kitchen glucose blood (BAYER CONTOUR TEST) test strip               . INS  SYRINGE/NEEDLE 1CC/28G 28G X 1/2" 1 ML MISC               . insulin NPH-regular Human (NOVOLIN 70/30) (70-30) 100 UNIT/ML injection               . insulin regular (NOVOLIN R,HUMULIN R) 100 units/mL injection               . Insulin Syringe-Needle U-100 (B-D INS SYRINGE 0.5CC/31GX5/16) 31G X 5/16" 0.5 ML MISC               . irbesartan (AVAPRO) 300 MG tablet      300 mg daily.         Marland Kitchen lidocaine-prilocaine (EMLA) cream   Topical   Apply 1 application topically as needed.   30 g   6   . loperamide (IMODIUM) 2 MG capsule   Oral   Take by mouth.         . memantine (NAMENDA) 5 MG tablet      5 mg daily.         Marland Kitchen omeprazole (PRILOSEC) 40 MG capsule      40 mg  daily.         . oxybutynin (DITROPAN-XL) 5 MG 24 hr tablet      5 mg daily.         . temazepam (RESTORIL) 7.5 MG capsule      7.5 mg Nightly.         . traMADol (ULTRAM) 50 MG tablet   Oral   Take 50 mg by mouth as needed.         Festus Barren THIN LANCETS MISC                 Allergies Codeine and Phenergan  No family history on file.  Social History Social History  Substance Use Topics  . Smoking status: Never Smoker   . Smokeless tobacco: Never Used  . Alcohol Use: None    Review of Systems Constitutional: No fever/chills.No lightheadedness or syncope.  Eyes: No visual changes. ENT: No sore throat. No congestion or rhinorrhea. Cardiovascular: Denies chest pain. Denies palpitations. Respiratory: Denies shortness of breath.  No cough. Gastrointestinal: Positive diffuse abdominal pain.  No nausea, no vomiting.  No diarrhea.  No constipation. Genitourinary: Positive for dysuria. Musculoskeletal: Negative for back pain. No flank pain. Skin: Negative for rash. Neurological: Negative for headaches. No focal numbness, tingling or weakness.   10-point ROS otherwise negative.  ____________________________________________   PHYSICAL EXAM:  VITAL SIGNS: ED  Triage Vitals  Enc Vitals Group     BP 04/13/16 1313 136/78 mmHg     Pulse Rate 04/13/16 1313 101     Resp 04/13/16 1313 18     Temp 04/13/16 1313 98.4 F (36.9 C)     Temp Source 04/13/16 1313 Oral     SpO2 04/13/16 1313 95 %     Weight 04/13/16 1313 135 lb (61.236 kg)     Height 04/13/16 1313 4\' 9"  (1.448 m)     Head Cir --      Peak Flow --      Pain Score 04/13/16 1314 8     Pain Loc --      Pain Edu? --      Excl. in [place]? --     Constitutional: Alert and oriented. Well appearing and in no acute distress. Answers questions appropriately. Eyes: Conjunctivae are normal.  EOMI. No scleral icterus. Head: Atraumatic. Nose: No congestion/rhinnorhea. Mouth/Throat: Mucous membranes are moist. Poor diffuse dentition. Neck: No stridor.  Supple.  No JVD. No meningismus. Cardiovascular: Normal rate, regular rhythm. No murmurs, rubs or gallops.  Respiratory: Normal respiratory effort.  No accessory muscle use or retractions. Lungs CTAB.  No wheezes, rales or ronchi. Gastrointestinal: Soft and mildly distended. Diffusely tender to palpation in all quadrants without focality..  No guarding or rebound.  No peritoneal signs. Genitourinary: Malodorous urine in her diaper Musculoskeletal: No LE edema.  Neurologic:  A&Ox3.  Speech is clear.  Face and smile are symmetric.  EOMI.  Moves all extremities well. Skin:  Skin is warm, dry and intact. No rash noted. Psychiatric: Mood and affect are normal.  ____________________________________________   LABS (all labs ordered are listed, but only abnormal results are displayed)  Labs Reviewed  CBC - Abnormal; Notable for the following:    WBC 13.3 (*)    All other components within normal limits  BASIC METABOLIC PANEL - Abnormal; Notable for the following:    Sodium 129 (*)    Chloride 96 (*)    Glucose, Bld 177 (*)    Creatinine, Ser 1.14 (*)  Calcium 8.7 (*)    GFR calc non Af Amer 43 (*)    GFR calc Af Amer 49 (*)    All other  components within normal limits  URINE CULTURE  URINALYSIS COMPLETEWITH MICROSCOPIC ([facility] ONLY)   ____________________________________________  EKG  Not indicated ____________________________________________  RADIOLOGY  No results found.  ____________________________________________   PROCEDURES  Procedure(s) performed: None  Critical Care performed: No ____________________________________________   INITIAL IMPRESSION / ASSESSMENT AND PLAN / ED COURSE  Pertinent labs & imaging results that were available during my care of the patient were reviewed by me and considered in my medical decision making (see chart for details).  82 y.o. female with recurrent UTI sent by her PMDs office for UTI which has failed outpatient antibiotics 3. I do not have the patient's microbiology results, so I will try to contact Dr. Lovena Le office. If I'm unable to reach her, I'll start the patient on Rocephin and a culture has been sent from the emergency department. Patient is not having systemic symptoms, so blood cultures are not indicated at this time. Given her abdominal discomfort and significant resistance to oral antibiotics, will get a CT renal stone protocol just to rule out that she has another cause for having infection, including infected stones or stone.  ----------------------------------------- 5:22 PM on 04/13/2016 -----------------------------------------  I was able to reach Dr. Candiss Norse, and see the urine culture results in care everywhere. The patient's urine is Pseudomonas, and susceptible to Zosyn which I will start here. Awaiting the results of her CT renal stone protocol, and we'll plan admission.  ----------------------------------------- 6:06 PM on 04/13/2016 -----------------------------------------  The patient does have some mild hyponatremia, and the bump in her creatinine. She has had renal insufficiency in the past that has been worse than today, but her last  creatinine prior to today was normal. She is receiving IV fluids which should help this as well as her infection. I have ordered Zosyn for her urinary tract infection. The CT scan does not show any acute renal stones. She does have all thickening of the bladder which is consistent with a diagnosis of cystitis. There is some intraluminal air, and I do not know of any recent instrumentation or surgeries that this patient has had. I will let the hospitalist know upon her admission.  ____________________________________________  FINAL CLINICAL IMPRESSION(S) / ED DIAGNOSES  Final diagnoses:  None      NEW MEDICATIONS STARTED DURING THIS VISIT:  New Prescriptions   No medications on file     Eula Listen, MD 04/13/16 1807

## 2016-04-13 NOTE — H&P (Signed)
[facility] NAME: Cindy Sullivan    MR#:  [id number]  DATE OF BIRTH:  18-Jan-1929  DATE OF ADMISSION:  04/13/2016  PRIMARY CARE PHYSICIAN: Glendon Axe, MD   REQUESTING/REFERRING PHYSICIAN: Dr. Mariea Clonts  CHIEF COMPLAINT:   Chief Complaint  Patient presents with  . Abnormal Lab  Dysuria, frequency and having failed oral antibiotics as an outpatient.  HISTORY OF PRESENT ILLNESS:  Cindy Sullivan  is a 82 y.o. female with a known history of Iron deficiency anemia, GERD, hypertension, hyperlipidemia, non-insulin-dependent diabetes, chronic back pain, dementia presents to the hospital due to dysuria, frequency in urination which has been progressive over the past few days. Patient apparently has had recurrent UTIs over the past few months she has been on oral antibiotics including Levaquin, amoxicillin and also now on Macrobid and her symptoms do improve but shortly after she finishes the antibiotics they recur. She presently denies any fevers, chills, nausea, vomiting. She Does admit to some vague abdominal pain, but no other associated symptoms. Given the recurrence of her symptoms and having failed oral antibiotics and outpatient hospitalist services were contacted further treatment and evaluation. Patient did have a urine culture drawn at her primary care physician's office on 04/08/2016 which showed Pseudomonas.  PAST MEDICAL HISTORY:   Past Medical History  Diagnosis Date  . IDA (iron deficiency anemia)   . GERD (gastroesophageal reflux disease)   . Hyperlipidemia   . Hypertension   . Non-insulin dependent type 2 diabetes mellitus ([place])   . Pancreatitis   . H/O: GI bleed   . Chronic diarrhea   . Chest pain syndrome   . Insomnia   . Chronic back pain     PAST SURGICAL HISTORY:   Past Surgical History  Procedure Laterality Date  . Cholecystectomy    . Appendectomy    . Hernia repair      x 2  . Exploratory laparotomy    . Knee  arthroscopy Left   . Total abdominal hysterectomy w/ bilateral salpingoophorectomy      one ovary still left    SOCIAL HISTORY:   Social History  Substance Use Topics  . Smoking status: Never Smoker   . Smokeless tobacco: Never Used  . Alcohol Use: Not on file    FAMILY HISTORY:  No family history on file.  DRUG ALLERGIES:   Allergies  Allergen Reactions  . Codeine Other (See Comments)    Reaction:  Headaches   . Phenergan [Promethazine Hcl] Other (See Comments)    Reaction:  Unknown     REVIEW OF SYSTEMS:   Review of Systems  Constitutional: Negative for fever and weight loss.  HENT: Negative for congestion, nosebleeds and tinnitus.   Eyes: Negative for blurred vision, double vision and redness.  Respiratory: Negative for cough, hemoptysis and shortness of breath.   Cardiovascular: Negative for chest pain, orthopnea, leg swelling and PND.  Gastrointestinal: Positive for nausea and abdominal pain. Negative for vomiting, diarrhea and melena.  Genitourinary: Negative for dysuria, urgency and hematuria.  Musculoskeletal: Negative for joint pain and falls.  Neurological: Positive for weakness (generalized). Negative for dizziness, tingling, sensory change, focal weakness, seizures and headaches.  Endo/Heme/Allergies: Negative for polydipsia. Does not bruise/bleed easily.  Psychiatric/Behavioral: Negative for depression and memory loss. The patient is not nervous/anxious.     MEDICATIONS AT HOME:   Prior to Admission medications   Medication Sig Start Date End Date Taking? Authorizing Provider  acetaminophen (TYLENOL) 500 MG tablet  Take 500 mg by mouth 3 (three) times daily.    Yes Historical Provider, MD  atorvastatin (LIPITOR) 20 MG tablet Take 20 mg by mouth at bedtime.    Yes Historical Provider, MD  insulin NPH-regular Human (NOVOLIN 70/30) (70-30) 100 UNIT/ML injection Inject 3-10 Units into the skin 2 (two) times daily. Pt uses 10 units in the morning and 3 units in  the evening.   Yes Historical Provider, MD  insulin regular (NOVOLIN R,HUMULIN R) 100 units/mL injection Inject 2-10 Units into the skin 3 (three) times daily with meals as needed for high blood sugar. Pt uses as needed per sliding scale:    151-200:  2 units  201-250:  4 units 251-300:  6 units  301-350:  8 units 351-400:  10 units   Yes Historical Provider, MD  irbesartan (AVAPRO) 150 MG tablet Take 150 mg by mouth daily.   Yes Historical Provider, MD  memantine (NAMENDA) 5 MG tablet Take 5 mg by mouth 2 (two) times daily.    Yes Historical Provider, MD  omeprazole (PRILOSEC) 40 MG capsule Take 40 mg by mouth daily.    Yes Historical Provider, MD  oxybutynin (DITROPAN-XL) 5 MG 24 hr tablet Take 5 mg by mouth at bedtime.    Yes Historical Provider, MD  temazepam (RESTORIL) 7.5 MG capsule Take 7.5 mg by mouth at bedtime as needed for sleep.    Yes Historical Provider, MD  traMADol (ULTRAM) 50 MG tablet Take 50 mg by mouth every 6 (six) hours as needed for moderate pain.    Yes Historical Provider, MD      VITAL SIGNS:  Blood pressure 164/90, pulse 96, temperature 98.4 F (36.9 C), temperature source Oral, resp. rate 18, height 4\' 9"  (1.448 m), weight 61.236 kg (135 lb), SpO2 98 %.  PHYSICAL EXAMINATION:  Physical Exam  GENERAL:  82 y.o.-year-old patient lying in the bed with no acute distress.  EYES: Pupils equal, round, reactive to light and accommodation. No scleral icterus. Extraocular muscles intact.  HEENT: Head atraumatic, normocephalic. Oropharynx and nasopharynx clear. No oropharyngeal erythema, moist oral mucosa  NECK:  Supple, no jugular venous distention. No thyroid enlargement, no tenderness.  LUNGS: Normal breath sounds bilaterally, no wheezing, rales, rhonchi. No use of accessory muscles of respiration.  CARDIOVASCULAR: S1, S2 RRR. No murmurs, rubs, gallops, clicks.  ABDOMEN: Soft, nontender, nondistended. Bowel sounds present. No organomegaly or mass.  EXTREMITIES: No  pedal edema, cyanosis, or clubbing. + 2 pedal & radial pulses b/l.   NEUROLOGIC: Cranial nerves II through XII are intact. No focal Motor or sensory deficits appreciated b/l PSYCHIATRIC: The patient is alert and oriented x 2. Good affect.  SKIN: No obvious rash, lesion, or ulcer.   LABORATORY PANEL:   CBC  Recent Labs Lab 04/13/16 1324  WBC 13.3*  HGB 12.0  HCT 36.7  PLT 426   ------------------------------------------------------------------------------------------------------------------  Chemistries   Recent Labs Lab 04/13/16 1324  NA 129*  K 4.9  CL 96*  CO2 25  GLUCOSE 177*  BUN 19  CREATININE 1.14*  CALCIUM 8.7*   ------------------------------------------------------------------------------------------------------------------  Cardiac Enzymes No results for input(s): TROPONINI in the last 168 hours. ------------------------------------------------------------------------------------------------------------------  RADIOLOGY:  Ct Renal Stone Study  04/13/2016  CLINICAL DATA:  urine culture results showed that patient needed to come to the hospital for IV antibiotics. Patient is complaining of back pain and dysuria. Patient states, "the pain is so bad I cry." EXAM: CT ABDOMEN AND PELVIS WITHOUT CONTRAST TECHNIQUE: Multidetector CT imaging  of the abdomen and pelvis was performed following the standard protocol without IV contrast. COMPARISON:  11/27/2012 FINDINGS: Lower chest: Mitral annulus calcifications. No pleural or pericardial effusion. Visualized lung bases clear. Hepatobiliary: Previous cholecystectomy. Mild intra and extrahepatic biliary ductal dilatation, slightly more conspicuous than on prior study. No focal liver lesion on this unenhanced exam. Chronic focal calcification in segment 6. Pancreas: Mild diffuse parenchymal atrophy without focal lesion or ductal dilatation. Spleen: Within normal limits in size. Adrenals/Urinary Tract: 2.7 cm left lower pole lesion  measures near fluid attenuation, present since scans dating back to 09/14/2011 with mild enlargement. No nephrolithiasis. No hydronephrosis. Thick-walled urinary bladder, partially distended. Some gas in the urinary bladder presumably from recent instrumentation. Stomach/Bowel: Small hiatal hernia. Stomach, small bowel, and colon are nondilated. A few small scattered distal descending and sigmoid diverticula without adjacent inflammatory/ edematous change. Fecal distention of the rectum. Vascular/Lymphatic: Heavy aortoiliac arterial calcifications without aneurysm. No adenopathy localized. Reproductive: Previous hysterectomy. Other: No ascites.  No free air. Musculoskeletal: Mild spondylitic changes in the lower thoracic and lumbar spine. No fracture. IMPRESSION: 1. Thickening of the wall of the urinary bladder suggesting cystitis. Intraluminal gas presumably secondary to recent instrumentation. 2. Fecal distention of the rectum without obstruction. 3. Descending and sigmoid diverticulosis. 4. Small hiatal hernia. 5. Left lower pole renal lesion, possibly benign cyst but incompletely characterized. Electronically Signed   By: Lucrezia Europe M.D.   On: 04/13/2016 17:29     IMPRESSION AND PLAN:   82 year old female with past medical history of an deficiency anemia, GERD, hypertension, hyperlipidemia, non-insulin-dependent diabetes, history of GI bleed, chronic back pain presents to the hospital due to dysuria, frequency and noted to have urinary tract infection.  1. Urinary tract infection-patient has failed oral antibiotics as an outpatient. She was noted to have a leukocytosis and she is tachycardic. -Patient was on 3 rounds of antibiotics as an outpatient with amoxicillin, Macrobid, Levaquin with improvement slightly of her symptoms but then would recur. She is now being admitted for IV antibiotics. -Urine cultures on 04/08/2016 growth Pseudomonas. It was sensitive to [place]. I will place on IV ceftriaxone  for now. Follow repeat urine cultures. -Patient CT scan of the abdomen and pelvis showing no evidence of renal stones or obstruction.  2. Leukocytosis-secondary to UTI. Follow with IV antibiotic therapy.  3. Hyponatremia-hypovolemic in nature. -I'll hydrate her with IV fluids. Follow sodium.  4. Acute kidney injury-secondary to dehydration and also to UTI. CT scan showing no evidence of hydronephrosis. -Continue gentle IV fluids and follow BUN and creatinine. -Renal dose meds avoid nephrotoxins.  5. Dementia-continue Namenda.  6. Diabetes type 2 without complication-continue  Novolin 70/30, sliding scale insulin.  7. GERD - cont. Protonix.   8. Urinary Incontinence - cont. Oxybutynin.   9. HTN - cont. Avapro.    All the records are reviewed and case discussed with ED provider. Management plans discussed with the patient, family and they are in agreement.  CODE STATUS: Full   TOTAL TIME TAKING CARE OF THIS PATIENT: 45 minutes.    Henreitta Leber M.D on 04/13/2016 at 7:21 PM  Between 7am to 6pm - Pager - [phone]  After 6pm go to [email or web address] - password [email or web address] [facility]  Office  [phone]  CC: Primary care physician; Glendon Axe, MD

## 2016-04-13 NOTE — ED Notes (Signed)
Pt states she is unable to give urine sample at this time. 

## 2016-04-13 NOTE — ED Notes (Signed)
Pt incontinent of urine, Pt and bed cleaned. In and out done for urine sample

## 2016-04-13 NOTE — ED Notes (Signed)
Patient presents to the ED after getting a call from Dr. Keturah Barre office which stated that patient's urine culture results showed that patient needed to come to the hospital for IV antibiotics.  Patient is complaining of back pain and dysuria.  Patient states, "the pain is so bad I cry."  Patient is alert and oriented x 4.

## 2016-04-13 NOTE — Progress Notes (Signed)
Anticoagulation monitoring(Lovenox):  82 yo  ordered Lovenox 40 mg Q24h  Filed Weights   04/13/16 1313  Weight: 135 lb (61.236 kg)   BMI  Lab Results  Component Value Date   CREATININE 1.14* 04/13/2016   CREATININE 1.12 05/20/2014   CREATININE 1.66* 05/19/2014   Estimated Creatinine Clearance: 27.1 mL/min (by C-G formula based on Cr of 1.14). Hemoglobin & Hematocrit     Component Value Date/Time   HGB 12.0 04/13/2016 1324   HGB 10.9* 12/02/2014 1347   HCT 36.7 04/13/2016 1324   HCT 32.5* 12/02/2014 1347     Per Protocol for Patient with estCrcl< 30 ml/min and BMI < 40, will transition to Lovenox 30 mg Q24h.

## 2016-04-13 NOTE — ED Notes (Signed)
MD at bedside. 

## 2016-04-13 NOTE — ED Notes (Signed)
Pt transported to Room 125

## 2016-04-14 DIAGNOSIS — G8929 Other chronic pain: Secondary | ICD-10-CM | POA: Diagnosis present

## 2016-04-14 DIAGNOSIS — I10 Essential (primary) hypertension: Secondary | ICD-10-CM | POA: Diagnosis present

## 2016-04-14 DIAGNOSIS — Z888 Allergy status to other drugs, medicaments and biological substances status: Secondary | ICD-10-CM | POA: Diagnosis not present

## 2016-04-14 DIAGNOSIS — Z87440 Personal history of urinary (tract) infections: Secondary | ICD-10-CM | POA: Diagnosis not present

## 2016-04-14 DIAGNOSIS — N390 Urinary tract infection, site not specified: Secondary | ICD-10-CM | POA: Diagnosis present

## 2016-04-14 DIAGNOSIS — R000 Tachycardia, unspecified: Secondary | ICD-10-CM | POA: Diagnosis present

## 2016-04-14 DIAGNOSIS — Z794 Long term (current) use of insulin: Secondary | ICD-10-CM | POA: Diagnosis not present

## 2016-04-14 DIAGNOSIS — K219 Gastro-esophageal reflux disease without esophagitis: Secondary | ICD-10-CM | POA: Diagnosis present

## 2016-04-14 DIAGNOSIS — Z90710 Acquired absence of both cervix and uterus: Secondary | ICD-10-CM | POA: Diagnosis not present

## 2016-04-14 DIAGNOSIS — E871 Hypo-osmolality and hyponatremia: Secondary | ICD-10-CM | POA: Diagnosis present

## 2016-04-14 DIAGNOSIS — N179 Acute kidney failure, unspecified: Secondary | ICD-10-CM | POA: Diagnosis present

## 2016-04-14 DIAGNOSIS — F0390 Unspecified dementia without behavioral disturbance: Secondary | ICD-10-CM | POA: Diagnosis present

## 2016-04-14 DIAGNOSIS — B965 Pseudomonas (aeruginosa) (mallei) (pseudomallei) as the cause of diseases classified elsewhere: Secondary | ICD-10-CM | POA: Diagnosis present

## 2016-04-14 DIAGNOSIS — E860 Dehydration: Secondary | ICD-10-CM | POA: Diagnosis present

## 2016-04-14 DIAGNOSIS — Z79899 Other long term (current) drug therapy: Secondary | ICD-10-CM | POA: Diagnosis not present

## 2016-04-14 DIAGNOSIS — E119 Type 2 diabetes mellitus without complications: Secondary | ICD-10-CM | POA: Diagnosis present

## 2016-04-14 DIAGNOSIS — E861 Hypovolemia: Secondary | ICD-10-CM | POA: Diagnosis present

## 2016-04-14 DIAGNOSIS — D509 Iron deficiency anemia, unspecified: Secondary | ICD-10-CM | POA: Diagnosis present

## 2016-04-14 DIAGNOSIS — M549 Dorsalgia, unspecified: Secondary | ICD-10-CM | POA: Diagnosis present

## 2016-04-14 DIAGNOSIS — Z885 Allergy status to narcotic agent status: Secondary | ICD-10-CM | POA: Diagnosis not present

## 2016-04-14 DIAGNOSIS — E785 Hyperlipidemia, unspecified: Secondary | ICD-10-CM | POA: Diagnosis present

## 2016-04-14 DIAGNOSIS — Z7984 Long term (current) use of oral hypoglycemic drugs: Secondary | ICD-10-CM | POA: Diagnosis not present

## 2016-04-14 LAB — BASIC METABOLIC PANEL
Anion gap: 4 — ABNORMAL LOW (ref 5–15)
BUN: 15 mg/dL (ref 6–20)
CHLORIDE: 101 mmol/L (ref 101–111)
CO2: 27 mmol/L (ref 22–32)
CREATININE: 0.99 mg/dL (ref 0.44–1.00)
Calcium: 7.9 mg/dL — ABNORMAL LOW (ref 8.9–10.3)
GFR, EST AFRICAN AMERICAN: 59 mL/min — AB (ref >60)
GFR, EST NON AFRICAN AMERICAN: 51 mL/min — AB (ref >60)
Glucose, Bld: 106 mg/dL — ABNORMAL HIGH (ref 65–99)
POTASSIUM: 4.2 mmol/L (ref 3.5–5.1)
SODIUM: 132 mmol/L — AB (ref 135–145)

## 2016-04-14 LAB — CBC
HEMATOCRIT: 30.2 % — AB (ref 35.0–47.0)
Hemoglobin: 10 g/dL — ABNORMAL LOW (ref 12.0–16.0)
MCH: 28.5 pg (ref 26.0–34.0)
MCHC: 33.3 g/dL (ref 32.0–36.0)
MCV: 85.7 fL (ref 80.0–100.0)
PLATELETS: 340 10*3/uL (ref 150–440)
RBC: 3.52 MIL/uL — AB (ref 3.80–5.20)
RDW: 14.6 % — AB (ref 11.5–14.5)
WBC: 13.7 10*3/uL — AB (ref 3.6–11.0)

## 2016-04-14 LAB — GLUCOSE, CAPILLARY
GLUCOSE-CAPILLARY: 114 mg/dL — AB (ref 65–99)
GLUCOSE-CAPILLARY: 138 mg/dL — AB (ref 65–99)
GLUCOSE-CAPILLARY: 99 mg/dL (ref 65–99)
Glucose-Capillary: 93 mg/dL (ref 65–99)

## 2016-04-14 MED ORDER — GLUCERNA SHAKE PO LIQD
237.0000 mL | Freq: Two times a day (BID) | ORAL | Status: DC
Start: 2016-04-14 — End: 2016-04-17
  Administered 2016-04-14 – 2016-04-17 (×7): 237 mL via ORAL

## 2016-04-14 MED ORDER — ENOXAPARIN SODIUM 40 MG/0.4ML ~~LOC~~ SOLN
40.0000 mg | SUBCUTANEOUS | Status: DC
  Administered 2016-04-14 – 2016-04-16 (×3): 40 mg via SUBCUTANEOUS
  Filled 2016-04-14 (×3): qty 0.4

## 2016-04-14 MED ORDER — MORPHINE SULFATE (PF) 2 MG/ML IV SOLN
1.0000 mg | INTRAVENOUS | Status: DC | PRN
  Administered 2016-04-14 – 2016-04-17 (×6): 1 mg via INTRAVENOUS
  Filled 2016-04-14 (×6): qty 1

## 2016-04-14 NOTE — Progress Notes (Addendum)
[place] [facility]                                                                                                                                                                                            Patient Demographics   Cindy Sullivan, is a 82 y.o. female, DOB - 1929/07/11, [id number]  Admit date - 04/13/2016   Admitting Physician Henreitta Leber, MD  Outpatient Primary MD for the patient is Singh,Jasmine, MD   LOS - 0  Subjective:Patient presented with UTI failed outpatient therapy Still has frequency and buring    Review of Systems:   CONSTITUTIONAL: No documented fever. No fatigue, weakness. No weight gain, no weight loss.  EYES: No blurry or double vision.  ENT: No tinnitus. No postnasal drip. No redness of the oropharynx.  RESPIRATORY: No cough, no wheeze, no hemoptysis. No dyspnea.  CARDIOVASCULAR: No chest pain. No orthopnea. No palpitations. No syncope.  GASTROINTESTINAL: No nausea, no vomiting or diarrhea. No abdominal pain. No melena or hematochezia.  GENITOURINARY:Frequency urgency ENDOCRINE: No polyuria or nocturia. No heat or cold intolerance.  HEMATOLOGY: No anemia. No bruising. No bleeding.  INTEGUMENTARY: No rashes. No lesions.  MUSCULOSKELETAL: No arthritis. No swelling. No gout.  NEUROLOGIC: No numbness, tingling, or ataxia. No seizure-type activity.  PSYCHIATRIC: No anxiety. No insomnia. No ADD.    Vitals:   Filed Vitals:   04/13/16 1930 04/13/16 2046 04/14/16 0444 04/14/16 0800  BP: 148/81 135/57 137/58 167/65  Pulse: 100 99 75 80  Temp:  98 F (36.7 C) 97.9 F (36.6 C) 98.4 F (36.9 C)  TempSrc:  Oral Oral Oral  Resp:  16 16 18   Height:      Weight:  60.873 kg (134 lb 3.2 oz)    SpO2: 93% 96% 99% 98%    Wt Readings from Last 3 Encounters:  04/13/16 60.873 kg (134 lb 3.2 oz)     Intake/Output Summary (Last 24 hours) at 04/14/16 1139 Last data filed at 04/14/16 0900  Gross per 24 hour   Intake    345 ml  Output      0 ml  Net    345 ml    Physical Exam:   GENERAL: Pleasant-appearing in no apparent distress.  HEAD, EYES, EARS, NOSE AND THROAT: Atraumatic, normocephalic. Extraocular muscles are intact. Pupils equal and reactive to light. Sclerae anicteric. No conjunctival injection. No oro-pharyngeal erythema.  NECK: Supple. There is no jugular venous distention. No bruits, no lymphadenopathy, no thyromegaly.  HEART: Regular rate and rhythm,. No murmurs, no rubs, no clicks.  LUNGS: Clear to auscultation bilaterally. No rales  or rhonchi. No wheezes.  ABDOMEN: Soft, flat, nontender, nondistended. Has good bowel sounds. No hepatosplenomegaly appreciated.  EXTREMITIES: No evidence of any cyanosis, clubbing, or peripheral edema.  +2 pedal and radial pulses bilaterally.  NEUROLOGIC: The patient is alert, awake, and oriented x3 with no focal motor or sensory deficits appreciated bilaterally.  SKIN: Moist and warm with no rashes appreciated.  Psych: Not anxious, depressed LN: No inguinal LN enlargement    Antibiotics   Anti-infectives    Start     Dose/Rate Route Frequency Ordered Stop   04/13/16 2130  cefTRIAXone (ROCEPHIN) 1 g in dextrose 5 % 50 mL IVPB     1 g 100 mL/hr over 30 Minutes Intravenous Every 24 hours 04/13/16 2115     04/13/16 1730  piperacillin-tazobactam (ZOSYN) IVPB 3.375 g     3.375 g 100 mL/hr over 30 Minutes Intravenous  Once 04/13/16 1722 04/13/16 1804      Medications   Scheduled Meds: . acetaminophen  500 mg Oral TID  . atorvastatin  20 mg Oral QHS  . cefTRIAXone (ROCEPHIN)  IV  1 g Intravenous Q24H  . enoxaparin (LOVENOX) injection  40 mg Subcutaneous Q24H  . feeding supplement (GLUCERNA SHAKE)  237 mL Oral BID BM  . insulin aspart  0-5 Units Subcutaneous QHS  . insulin aspart  0-9 Units Subcutaneous TID WC  . insulin aspart protamine- aspart  10 Units Subcutaneous Q breakfast  . irbesartan  150 mg Oral Daily  . memantine  5 mg Oral BID   . oxybutynin  5 mg Oral QHS  . pantoprazole  40 mg Oral Daily   Continuous Infusions: . sodium chloride 75 mL/hr at 04/14/16 1050   PRN Meds:.acetaminophen **OR** acetaminophen, morphine injection, ondansetron **OR** ondansetron (ZOFRAN) IV, temazepam, traMADol   Data Review:   Micro Results No results found for this or any previous visit (from the past 240 hour(s)).  Radiology Reports Ct Renal Stone Study  04/13/2016  CLINICAL DATA:  urine culture results showed that patient needed to come to the hospital for IV antibiotics. Patient is complaining of back pain and dysuria. Patient states, "the pain is so bad I cry." EXAM: CT ABDOMEN AND PELVIS WITHOUT CONTRAST TECHNIQUE: Multidetector CT imaging of the abdomen and pelvis was performed following the standard protocol without IV contrast. COMPARISON:  11/27/2012 FINDINGS: Lower chest: Mitral annulus calcifications. No pleural or pericardial effusion. Visualized lung bases clear. Hepatobiliary: Previous cholecystectomy. Mild intra and extrahepatic biliary ductal dilatation, slightly more conspicuous than on prior study. No focal liver lesion on this unenhanced exam. Chronic focal calcification in segment 6. Pancreas: Mild diffuse parenchymal atrophy without focal lesion or ductal dilatation. Spleen: Within normal limits in size. Adrenals/Urinary Tract: 2.7 cm left lower pole lesion measures near fluid attenuation, present since scans dating back to 09/14/2011 with mild enlargement. No nephrolithiasis. No hydronephrosis. Thick-walled urinary bladder, partially distended. Some gas in the urinary bladder presumably from recent instrumentation. Stomach/Bowel: Small hiatal hernia. Stomach, small bowel, and colon are nondilated. A few small scattered distal descending and sigmoid diverticula without adjacent inflammatory/ edematous change. Fecal distention of the rectum. Vascular/Lymphatic: Heavy aortoiliac arterial calcifications without aneurysm. No  adenopathy localized. Reproductive: Previous hysterectomy. Other: No ascites.  No free air. Musculoskeletal: Mild spondylitic changes in the lower thoracic and lumbar spine. No fracture. IMPRESSION: 1. Thickening of the wall of the urinary bladder suggesting cystitis. Intraluminal gas presumably secondary to recent instrumentation. 2. Fecal distention of the rectum without obstruction. 3. Descending and sigmoid diverticulosis.  4. Small hiatal hernia. 5. Left lower pole renal lesion, possibly benign cyst but incompletely characterized. Electronically Signed   By: Lucrezia Europe M.D.   On: 04/13/2016 17:29     CBC  Recent Labs Lab 04/13/16 1324 04/14/16 0516  WBC 13.3* 13.7*  HGB 12.0 10.0*  HCT 36.7 30.2*  PLT 426 340  MCV 86.2 85.7  MCH 28.3 28.5  MCHC 32.8 33.3  RDW 14.5 14.6*    Chemistries   Recent Labs Lab 04/13/16 1324 04/14/16 0516  NA 129* 132*  K 4.9 4.2  CL 96* 101  CO2 25 27  GLUCOSE 177* 106*  BUN 19 15  CREATININE 1.14* 0.99  CALCIUM 8.7* 7.9*   ------------------------------------------------------------------------------------------------------------------ estimated creatinine clearance is 31.2 mL/min (by C-G formula based on Cr of 0.99). ------------------------------------------------------------------------------------------------------------------ No results for input(s): HGBA1C in the last 72 hours. ------------------------------------------------------------------------------------------------------------------ No results for input(s): CHOL, HDL, LDLCALC, TRIG, CHOLHDL, LDLDIRECT in the last 72 hours. ------------------------------------------------------------------------------------------------------------------ No results for input(s): TSH, T4TOTAL, T3FREE, THYROIDAB in the last 72 hours.  Invalid input(s): FREET3 ------------------------------------------------------------------------------------------------------------------ No results for input(s):  VITAMINB12, FOLATE, FERRITIN, TIBC, IRON, RETICCTPCT in the last 72 hours.  Coagulation profile No results for input(s): INR, PROTIME in the last 168 hours.  No results for input(s): DDIMER in the last 72 hours.  Cardiac Enzymes No results for input(s): CKMB, TROPONINI, MYOGLOBIN in the last 168 hours.  Invalid input(s): CK ------------------------------------------------------------------------------------------------------------------ Invalid input(s): POCBNP    Assessment & Plan   82 year old female with past medical history of an deficiency anemia, GERD, hypertension, hyperlipidemia, non-insulin-dependent diabetes, history of GI bleed, chronic back pain presents to the hospital due to dysuria, frequency and noted to have urinary tract infection.  1. Urinary tract infection- Continue ceftriaxone Due to patient being on  antibiotics medications already at home urine cultures are not growing anything If WBC continues to be elevated may need to consider infectious disease consult  2. Leukocytosis-secondary to UTI.   3. Hyponatremia-hypovolemic in nature. Improved with IV hydration continue  4. Acute kidney injury-secondary to dehydration resolving with IV fluids   5. Dementia-continue Namenda.  6. Diabetes type 2 without complication-continue Novolin 70/30, sliding scale insulin.  7. GERD - cont. Protonix.   8. Urinary Incontinence - cont. Oxybutynin.   9. HTN - cont. Avapro.      Code Status Orders        Start     Ordered   04/13/16 2116  Full code   Continuous     04/13/16 2115    Code Status History    Date Active Date Inactive Code Status Order ID Comments User Context   This patient has a current code status but no historical code status.           Consults None DVT Prophylaxis  Lovenox   Lab Results  Component Value Date   PLT 340 04/14/2016     Time Spent in minutes   50min  Greater than 50% of time spent in care coordination and  counseling patient regarding the condition and plan of care.   Dustin Flock M.D on 04/14/2016 at 11:39 AM  Between 7am to 6pm - Pager - [phone]  After 6pm go to [email or web address] - password [email or web address] [facility]   Office  [phone]

## 2016-04-14 NOTE — Plan of Care (Signed)
Problem: Education: Goal: Knowledge of treatment and prevention of UTI/Pyleonephritis will improve Outcome: Progressing Importance of hydration discussed. Purpose of antibiotics discussed  Problem: Safety: Goal: Ability to remain free from injury will improve Outcome: Progressing No injury.Up with assist to [facility]. Bed alarm on  Problem: Pain Managment: Goal: General experience of comfort will improve Outcome: Progressing Low back and abdominal pain controlled with tylenol and morphine 1mg  x1.  Problem: Skin Integrity: Goal: Risk for impaired skin integrity will decrease Outcome: Progressing Assisted with bath and linen change.  MASD improving groin area. Redness under right breast.  Problem: Fluid Volume: Goal: Ability to maintain a balanced intake and output will improve Outcome: Progressing Po fluids encouraged.  NS at 75 ml/hr.  Na+ = 132 this am

## 2016-04-14 NOTE — Progress Notes (Signed)
Initial Nutrition Assessment  DOCUMENTATION CODES:   Not applicable  INTERVENTION:   Cater to pt preferences on Heart Healthy/Carb Modified diet order Recommend Glucerna Shake po BID, each supplement provides 220 kcal and 10 grams of protein   NUTRITION DIAGNOSIS:   Inadequate oral intake related to poor appetite as evidenced by per patient/family report.  GOAL:   Patient will meet greater than or equal to 90% of their needs  MONITOR:   PO intake, Supplement acceptance, Labs, I & O's, Weight trends  REASON FOR ASSESSMENT:   Malnutrition Screening Tool    ASSESSMENT:   Pt admitted with UTI with acute kidney injury secondary to dehydration.   Past Medical History  Diagnosis Date  . IDA (iron deficiency anemia)   . GERD (gastroesophageal reflux disease)   . Hyperlipidemia   . Hypertension   . Non-insulin dependent type 2 diabetes mellitus ([place])   . Pancreatitis   . H/O: GI bleed   . Chronic diarrhea   . Chest pain syndrome   . Insomnia   . Chronic back pain     Diet Order:  Diet heart healthy/carb modified Room service appropriate?: Yes; Fluid consistency:: Thin   Pt ate 100% of french toast this morning for breakfast, observed on visit. Pt reports having a good appetite this am.  Pt reports her appetite has decreased for the past 2 months. Pt reports 'eating enough to maintain my blood sugar' but not as much as she used to eat.  Pt reports over the past 2 months typically she would eat eggs and bacon for breakfast, fresh tomato sandwich for lunch and for dinner pt reports she would either eat food cooked by her brother or her brother would go out and get her some dinner from a [employer] they both enjoy.  Pt reports always eating all of the food brought in by brother. Pt reports not drinking supplements before. Pt reports no difficulty chewing or swallowing usually.    Medications: SS novolog, novolog, Protonix, NS at 81mL/hr Labs: Na 132, Glucose 106 this  am   Gastrointestinal Profile: Last BM:  04/13/2016   Nutrition-Focused Physical Exam Findings: Nutrition-Focused physical exam completed. Findings are WDL for fat depletion, muscle depletion, and edema.    Weight Change: Pt reports 6-7lbs weight loss in the past couple of months. Pt reports UBW of 135lbs one month ago at [facility] visit. Current weight 134lbs.   Skin:  Reviewed, no issues   Height:   Ht Readings from Last 1 Encounters:  04/13/16 4\' 9"  (1.448 m)    Weight:   Wt Readings from Last 1 Encounters:  04/13/16 134 lb 3.2 oz (60.873 kg)    BMI:  Body mass index is 29.03 kg/(m^2).  Estimated Nutritional Needs:   Kcal:  1525-1850kcals  Protein:  67-80g protein  Fluid:  >/= 1.7L fluid  EDUCATION NEEDS:   No education needs identified at this time  Dwyane Luo, RD, LDN Pager [phone] Weekend/On-Call Pager [phone]

## 2016-04-14 NOTE — Progress Notes (Signed)
Anticoagulation monitoring(Lovenox):  Patient is an 82 yo female with orders for Lovenox 30 mg subq q24h. Dose was renally adjusted for CrCl <30 mL/min on 6/20.  Filed Weights   04/13/16 1313 04/13/16 2046  Weight: 135 lb (61.236 kg) 134 lb 3.2 oz (60.873 kg)   BMI  Lab Results  Component Value Date   CREATININE 0.99 04/14/2016   CREATININE 1.14* 04/13/2016   CREATININE 1.12 05/20/2014   Estimated Creatinine Clearance: 31.2 mL/min (by C-G formula based on Cr of 0.99). Hemoglobin & Hematocrit     Component Value Date/Time   HGB 10.0* 04/14/2016 0516   HGB 10.9* 12/02/2014 1347   HCT 30.2* 04/14/2016 0516   HCT 32.5* 12/02/2014 1347     Per Protocol for Patient with estCrcl>30 ml/min and BMI < 40, will transition to Lovenox 40 mg Q24h.

## 2016-04-15 LAB — URINE CULTURE

## 2016-04-15 LAB — CBC
HEMATOCRIT: 31.9 % — AB (ref 35.0–47.0)
HEMOGLOBIN: 10.8 g/dL — AB (ref 12.0–16.0)
MCH: 29.3 pg (ref 26.0–34.0)
MCHC: 33.9 g/dL (ref 32.0–36.0)
MCV: 86.4 fL (ref 80.0–100.0)
Platelets: 346 10*3/uL (ref 150–440)
RBC: 3.7 MIL/uL — AB (ref 3.80–5.20)
RDW: 14.6 % — ABNORMAL HIGH (ref 11.5–14.5)
WBC: 9.7 10*3/uL (ref 3.6–11.0)

## 2016-04-15 LAB — GLUCOSE, CAPILLARY
GLUCOSE-CAPILLARY: 148 mg/dL — AB (ref 65–99)
Glucose-Capillary: 101 mg/dL — ABNORMAL HIGH (ref 65–99)
Glucose-Capillary: 197 mg/dL — ABNORMAL HIGH (ref 65–99)
Glucose-Capillary: 119 mg/dL — ABNORMAL HIGH (ref 65–99)

## 2016-04-15 LAB — BASIC METABOLIC PANEL
Anion gap: 6 (ref 5–15)
BUN: 10 mg/dL (ref 6–20)
CHLORIDE: 102 mmol/L (ref 101–111)
CO2: 26 mmol/L (ref 22–32)
CREATININE: 0.69 mg/dL (ref 0.44–1.00)
Calcium: 8.2 mg/dL — ABNORMAL LOW (ref 8.9–10.3)
GFR calc Af Amer: >60 mL/min (ref >60)
GFR calc non Af Amer: >60 mL/min (ref >60)
Glucose, Bld: 102 mg/dL — ABNORMAL HIGH (ref 65–99)
POTASSIUM: 4.1 mmol/L (ref 3.5–5.1)
Sodium: 134 mmol/L — ABNORMAL LOW (ref 135–145)

## 2016-04-15 MED ORDER — CIPROFLOXACIN IN D5W 400 MG/200ML IV SOLN
400.0000 mg | Freq: Two times a day (BID) | INTRAVENOUS | Status: DC
  Administered 2016-04-15: 400 mg via INTRAVENOUS
  Filled 2016-04-15 (×2): qty 200

## 2016-04-15 MED ORDER — HYDRALAZINE HCL 20 MG/ML IJ SOLN
5.0000 mg | Freq: Four times a day (QID) | INTRAMUSCULAR | Status: DC | PRN
  Administered 2016-04-15 – 2016-04-17 (×2): 5 mg via INTRAVENOUS
  Filled 2016-04-15 (×2): qty 1

## 2016-04-15 MED ORDER — PIPERACILLIN-TAZOBACTAM 3.375 G IVPB
3.3750 g | Freq: Three times a day (TID) | INTRAVENOUS | Status: DC
  Administered 2016-04-15 – 2016-04-17 (×6): 3.375 g via INTRAVENOUS
  Filled 2016-04-15 (×8): qty 50

## 2016-04-15 NOTE — Evaluation (Signed)
Physical Therapy Evaluation Patient Details Name: FATISHA PETITTE MRN: [id number] DOB: 01-Jun-1929 Today's Date: 04/15/2016   History of Present Illness  Shire Esselman is an 82yo white female who comes to [facility] after several days of dysuria, and prior UTI. PMH: anemia, HTN, HLD, GERD, NIDDM, CLBP, dementia, and perpheral neuropathy. Pt lives alone at home, widowed, with personal care attendant who provides assistance with bathing 2x weekly. Pt is dependent frm brother and niece for IADL. At baseline she performs limited household distance AMB with SPC only, with 2 recent falls, after which she was able to come back up to standing.   Clinical Impression  Pt received semirecumbent in bed upon entry, brother in room. Pt c/o severe BLE pain and low back pain, intermittently catastrophizing and having pleasant conversation. The patient requires moderate physical assistance for bed mobility and transfers whereas she performs these with modified independence at home, and is unable to tolerate more than bed to chair ambulation distances before pleading to sit. Pt demonstrating impairment of strength, balance, activity tolerance, and acute on chronic low back pain, limiting performance of ADL and IADL at safe and prior level. Pt will benefit from skilled PT intervention to address the above, and improve safety and independence at home.     Follow Up Recommendations SNF;Supervision for mobility/OOB    Equipment Recommendations  None recommended by PT    Recommendations for Other Services       Precautions / Restrictions Precautions Precautions: Fall      Mobility  Bed Mobility Overal bed mobility: Needs Assistance Bed Mobility: Supine to Sit     Supine to sit: Mod assist     General bed mobility comments: cannot bring trunk up nor scoot toward EOB.   Transfers Overall transfer level: Needs assistance Equipment used: Straight cane Transfers: Sit to/from Stand Sit to Stand: Mod assist;From  elevated surface            Ambulation/Gait Ambulation/Gait assistance: Min assist Ambulation Distance (Feet): 9 Feet Assistive device: 1 person hand held assist;Straight cane     Gait velocity interpretation: <1.8 ft/sec, indicative of risk for recurrent falls General Gait Details: Very unsteady, poor confidence, and impulsive repeated attempting to sit without warning; C/o pain in back and feet, and shouting 'I'm gonna die, I'm gonna die!"  Stairs            Wheelchair Mobility    Modified Rankin (Stroke Patients Only)       Balance Overall balance assessment: Needs assistance         Standing balance support: During functional activity Standing balance-Leahy Scale: Poor                               Pertinent Vitals/Pain Pain Assessment: 0-10 Pain Score: 10-Worst pain ever Pain Location: Central thoracic spine (T6-T9), bilat feet/ankles.  Pain Descriptors / Indicators: Constant Pain Intervention(s): Limited activity within patient's tolerance;Monitored during session;Premedicated before session    Home Living Family/patient expects to be discharged to:: Private residence Living Arrangements: Alone Available Help at Discharge: Family Type of Home: House Home Access: Stairs to enter Entrance Stairs-Rails: Right Entrance Stairs-Number of Steps: 1 Home Layout: One level Home Equipment: Environmental consultant - 2 wheels;Cane - single point Additional Comments: Does not like useing the walker    Prior Function Level of Independence: Needs assistance   Gait / Transfers Assistance Needed: household distances with SPC only,   ADL's / Land  Needed: dependent with bathing, dresses with modI for time and effort.   Comments: Brother and Niece assist with groceries, some meals, and transportation.      Hand Dominance        Extremity/Trunk Assessment   Upper Extremity Assessment: Generalized weakness;Overall [facility] for tasks assessed            Lower Extremity Assessment: Generalized weakness;Overall [facility] for tasks assessed (bilateral neuropathic pain is severe adn ilmiting, does not tolerate socks, nor standing on floor. )         Communication      Cognition Arousal/Alertness: Awake/alert Behavior During Therapy: Anxious;Impulsive Overall Cognitive Status: No family/caregiver present to determine baseline cognitive functioning                      General Comments      Exercises        Assessment/Plan    PT Assessment Patient needs continued PT services  PT Diagnosis Difficulty walking;Abnormality of gait;Generalized weakness   PT Problem List Decreased strength;Decreased range of motion;Decreased knowledge of use of DME;Decreased safety awareness;Decreased balance;Decreased activity tolerance;Decreased mobility;Pain  PT Treatment Interventions Gait training;Stair training;Functional mobility training;DME instruction;Therapeutic activities;Therapeutic exercise;Balance training   PT Goals (Current goals can be found in the Care Plan section) Acute Rehab PT Goals Patient Stated Goal: go home  PT Goal Formulation: With patient Time For Goal Achievement: 04/29/16 Potential to Achieve Goals: Fair    Frequency Min 2X/week   Barriers to discharge Decreased caregiver support;Inaccessible home environment      Co-evaluation               End of Session Equipment Utilized During Treatment: Gait belt Activity Tolerance: Patient limited by pain;Patient limited by fatigue;Patient tolerated treatment well Patient left: in chair;with call bell/phone within reach;with family/visitor present Nurse Communication: Other (comment) (mobiity, pain, and NT agreeable to place chair pad alarm beneath pt. )         Time: [id number] PT Time Calculation (min) (ACUTE ONLY): 23 min   Charges:   PT Evaluation $PT Eval Moderate Complexity: 1 Procedure PT Treatments $Therapeutic Activity: 8-22 mins   PT G  Codes:        3:50 PM, 05-03-16 Etta Grandchild, PT, DPT PRN Physical Therapist - [place] License # [id number] [phone] (mobile)

## 2016-04-15 NOTE — Progress Notes (Signed)
[facility]                                                                                                                                                                                            Patient Demographics   Cindy Sullivan, is a 82 y.o. female, DOB - [birth year], [id number]  Admit date - 04/13/2016   Admitting Physician Henreitta Leber, MD  Outpatient Primary MD for the patient is Singh,Jasmine, MD   LOS - 1  Subjective: Patient Still feeling weak, urine cx pseudomonas   Review of Systems:   CONSTITUTIONAL: No documented fever. No fatigue, weakness. No weight gain, no weight loss.  EYES: No blurry or double vision.  ENT: No tinnitus. No postnasal drip. No redness of the oropharynx.  RESPIRATORY: No cough, no wheeze, no hemoptysis. No dyspnea.  CARDIOVASCULAR: No chest pain. No orthopnea. No palpitations. No syncope.  GASTROINTESTINAL: No nausea, no vomiting or diarrhea. No abdominal pain. No melena or hematochezia.  GENITOURINARY:Frequency urgency ENDOCRINE: No polyuria or nocturia. No heat or cold intolerance.  HEMATOLOGY: No anemia. No bruising. No bleeding.  INTEGUMENTARY: No rashes. No lesions.  MUSCULOSKELETAL: No arthritis. No swelling. No gout.  NEUROLOGIC: No numbness, tingling, or ataxia. No seizure-type activity.  PSYCHIATRIC: No anxiety. No insomnia. No ADD.    Vitals:   Filed Vitals:   04/15/16 0008 04/15/16 0446 04/15/16 0510 04/15/16 0619  BP: 157/71 164/75 175/82 149/62  Pulse: 80 85 82 101  Temp:  98.2 F (36.8 C)    TempSrc:  Oral    Resp:      Height:      Weight:      SpO2:  100% 100%     Wt Readings from Last 3 Encounters:  04/13/16 60.873 kg (134 lb 3.2 oz)     Intake/Output Summary (Last 24 hours) at 04/15/16 1456 Last data filed at 04/15/16 1017  Gross per 24 hour  Intake    615 ml  Output      0 ml  Net    615 ml    Physical Exam:   GENERAL: Pleasant-appearing in no  apparent distress.  HEAD, EYES, EARS, NOSE AND THROAT: Atraumatic, normocephalic. Extraocular muscles are intact. Pupils equal and reactive to light. Sclerae anicteric. No conjunctival injection. No oro-pharyngeal erythema.  NECK: Supple. There is no jugular venous distention. No bruits, no lymphadenopathy, no thyromegaly.  HEART: Regular rate and rhythm,. No murmurs, no rubs, no clicks.  LUNGS: Clear to auscultation bilaterally. No rales or rhonchi. No wheezes.  ABDOMEN: Soft, flat, nontender, nondistended. Has good bowel sounds. No hepatosplenomegaly  appreciated.  EXTREMITIES: No evidence of any cyanosis, clubbing, or peripheral edema.  +2 pedal and radial pulses bilaterally.  NEUROLOGIC: The patient is alert, awake, and oriented x3 with no focal motor or sensory deficits appreciated bilaterally.  SKIN: Moist and warm with no rashes appreciated.  Psych: Not anxious, depressed LN: No inguinal LN enlargement    Antibiotics   Anti-infectives    Start     Dose/Rate Route Frequency Ordered Stop   04/15/16 1500  piperacillin-tazobactam (ZOSYN) IVPB 3.375 g     3.375 g 12.5 mL/hr over 240 Minutes Intravenous Every 8 hours 04/15/16 1435     04/15/16 0900  ciprofloxacin (CIPRO) IVPB 400 mg  Status:  Discontinued     400 mg 200 mL/hr over 60 Minutes Intravenous Every 12 hours 04/15/16 0857 04/15/16 1433   04/13/16 2130  cefTRIAXone (ROCEPHIN) 1 g in dextrose 5 % 50 mL IVPB  Status:  Discontinued     1 g 100 mL/hr over 30 Minutes Intravenous Every 24 hours 04/13/16 2115 04/15/16 0857   04/13/16 1730  piperacillin-tazobactam (ZOSYN) IVPB 3.375 g     3.375 g 100 mL/hr over 30 Minutes Intravenous  Once 04/13/16 1722 04/13/16 1804      Medications   Scheduled Meds: . acetaminophen  500 mg Oral TID  . atorvastatin  20 mg Oral QHS  . enoxaparin (LOVENOX) injection  40 mg Subcutaneous Q24H  . feeding supplement (GLUCERNA SHAKE)  237 mL Oral BID BM  . insulin aspart  0-5 Units Subcutaneous QHS   . insulin aspart  0-9 Units Subcutaneous TID WC  . insulin aspart protamine- aspart  10 Units Subcutaneous Q breakfast  . irbesartan  150 mg Oral Daily  . memantine  5 mg Oral BID  . oxybutynin  5 mg Oral QHS  . pantoprazole  40 mg Oral Daily  . piperacillin-tazobactam (ZOSYN)  IV  3.375 g Intravenous Q8H   Continuous Infusions: . sodium chloride 75 mL/hr at 04/15/16 0018   PRN Meds:.acetaminophen **OR** acetaminophen, hydrALAZINE, morphine injection, ondansetron **OR** ondansetron (ZOFRAN) IV, temazepam, traMADol   Data Review:   Micro Results Recent Results (from the past 240 hour(s))  Urine culture     Status: Abnormal   Collection Time: 04/13/16  6:01 PM  Result Value Ref Range Status   Specimen Description URINE, RANDOM  Final   Special Requests NONE  Final   Culture (A)  Final    >=100,000 COLONIES/mL PSEUDOMONAS AERUGINOSA 50,000 COLONIES/mL CITROBACTER FREUNDII    Report Status 04/15/2016 FINAL  Final   Organism ID, Bacteria PSEUDOMONAS AERUGINOSA (A)  Final   Organism ID, Bacteria CITROBACTER FREUNDII (A)  Final      Susceptibility   Citrobacter freundii - MIC*    CEFAZOLIN >=64 RESISTANT Resistant     CEFTRIAXONE <=1 SENSITIVE Sensitive     CIPROFLOXACIN <=0.25 SENSITIVE Sensitive     GENTAMICIN <=1 SENSITIVE Sensitive     IMIPENEM <=0.25 SENSITIVE Sensitive     NITROFURANTOIN <=16 SENSITIVE Sensitive     TRIMETH/SULFA <=20 SENSITIVE Sensitive     PIP/TAZO <=4 SENSITIVE Sensitive     * 50,000 COLONIES/mL CITROBACTER FREUNDII   Pseudomonas aeruginosa - MIC*    CEFTAZIDIME 4 SENSITIVE Sensitive     CIPROFLOXACIN <=0.25 SENSITIVE Sensitive     GENTAMICIN <=1 SENSITIVE Sensitive     IMIPENEM 2 SENSITIVE Sensitive     PIP/TAZO 8 SENSITIVE Sensitive     CEFEPIME 4 SENSITIVE Sensitive     * >=100,000 COLONIES/mL PSEUDOMONAS  AERUGINOSA    Radiology Reports Ct Renal Stone Study  04/13/2016  CLINICAL DATA:  urine culture results showed that patient needed to  come to the hospital for IV antibiotics. Patient is complaining of back pain and dysuria. Patient states, "the pain is so bad I cry." EXAM: CT ABDOMEN AND PELVIS WITHOUT CONTRAST TECHNIQUE: Multidetector CT imaging of the abdomen and pelvis was performed following the standard protocol without IV contrast. COMPARISON:  11/27/2012 FINDINGS: Lower chest: Mitral annulus calcifications. No pleural or pericardial effusion. Visualized lung bases clear. Hepatobiliary: Previous cholecystectomy. Mild intra and extrahepatic biliary ductal dilatation, slightly more conspicuous than on prior study. No focal liver lesion on this unenhanced exam. Chronic focal calcification in segment 6. Pancreas: Mild diffuse parenchymal atrophy without focal lesion or ductal dilatation. Spleen: Within normal limits in size. Adrenals/Urinary Tract: 2.7 cm left lower pole lesion measures near fluid attenuation, present since scans dating back to 09/14/2011 with mild enlargement. No nephrolithiasis. No hydronephrosis. Thick-walled urinary bladder, partially distended. Some gas in the urinary bladder presumably from recent instrumentation. Stomach/Bowel: Small hiatal hernia. Stomach, small bowel, and colon are nondilated. A few small scattered distal descending and sigmoid diverticula without adjacent inflammatory/ edematous change. Fecal distention of the rectum. Vascular/Lymphatic: Heavy aortoiliac arterial calcifications without aneurysm. No adenopathy localized. Reproductive: Previous hysterectomy. Other: No ascites.  No free air. Musculoskeletal: Mild spondylitic changes in the lower thoracic and lumbar spine. No fracture. IMPRESSION: 1. Thickening of the wall of the urinary bladder suggesting cystitis. Intraluminal gas presumably secondary to recent instrumentation. 2. Fecal distention of the rectum without obstruction. 3. Descending and sigmoid diverticulosis. 4. Small hiatal hernia. 5. Left lower pole renal lesion, possibly benign cyst but  incompletely characterized. Electronically Signed   By: Lucrezia Europe M.D.   On: 04/13/2016 17:29     CBC  Recent Labs Lab 04/13/16 1324 04/14/16 0516 04/15/16 0521  WBC 13.3* 13.7* 9.7  HGB 12.0 10.0* 10.8*  HCT 36.7 30.2* 31.9*  PLT 426 340 346  MCV 86.2 85.7 86.4  MCH 28.3 28.5 29.3  MCHC 32.8 33.3 33.9  RDW 14.5 14.6* 14.6*    Chemistries   Recent Labs Lab 04/13/16 1324 04/14/16 0516 04/15/16 0521  NA 129* 132* 134*  K 4.9 4.2 4.1  CL 96* 101 102  CO2 25 27 26   GLUCOSE 177* 106* 102*  BUN 19 15 10   CREATININE 1.14* 0.99 0.69  CALCIUM 8.7* 7.9* 8.2*   ------------------------------------------------------------------------------------------------------------------ estimated creatinine clearance is 38.6 mL/min (by C-G formula based on Cr of 0.69). ------------------------------------------------------------------------------------------------------------------ No results for input(s): HGBA1C in the last 72 hours. ------------------------------------------------------------------------------------------------------------------ No results for input(s): CHOL, HDL, LDLCALC, TRIG, CHOLHDL, LDLDIRECT in the last 72 hours. ------------------------------------------------------------------------------------------------------------------ No results for input(s): TSH, T4TOTAL, T3FREE, THYROIDAB in the last 72 hours.  Invalid input(s): FREET3 ------------------------------------------------------------------------------------------------------------------ No results for input(s): VITAMINB12, FOLATE, FERRITIN, TIBC, IRON, RETICCTPCT in the last 72 hours.  Coagulation profile No results for input(s): INR, PROTIME in the last 168 hours.  No results for input(s): DDIMER in the last 72 hours.  Cardiac Enzymes No results for input(s): CKMB, TROPONINI, MYOGLOBIN in the last 168 hours.  Invalid input(s):  CK ------------------------------------------------------------------------------------------------------------------ Invalid input(s): POCBNP    Assessment & Plan   82 year old female with past medical history of an deficiency anemia, GERD, hypertension, hyperlipidemia, non-insulin-dependent diabetes, history of GI bleed, chronic back pain presents to the hospital due to dysuria, frequency and noted to have urinary tract infection.  1. Urinary tract infection- Due pseudomonas I discussed with Dr.  Fitzerald over the phone regarding treatment options he recommends fosfomycin, IV zosyn.  2. Leukocytosis-secondary to UTI.   3. Hyponatremia-hypovolemic in nature.   4. Acute kidney injury-secondary to dehydration resolving with IV fluids   5. Dementia-continue Namenda.  6. Diabetes type 2 without complication-continue Novolin 70/30, sliding scale insulin.  7. GERD - cont. Protonix.   8. Urinary Incontinence - cont. Oxybutynin.   9. HTN - cont. Avapro.      Code Status Orders        Start     Ordered   04/13/16 2116  Full code   Continuous     04/13/16 2115    Code Status History    Date Active Date Inactive Code Status Order ID Comments User Context   This patient has a current code status but no historical code status.           Consults None DVT Prophylaxis  Lovenox   Lab Results  Component Value Date   PLT 346 04/15/2016     Time Spent in minutes   20min  Greater than 50% of time spent in care coordination and counseling patient regarding the condition and plan of care.   Dustin Flock M.D on 04/15/2016 at 2:56 PM  Between 7am to 6pm - Pager - [phone]  After 6pm go to [email or web address] - password [email or web address] [place] [facility]   Office  [phone]

## 2016-04-16 ENCOUNTER — Encounter
Admission: RE | Admit: 2016-04-16 | Discharge: 2016-04-16 | Disposition: A | Discharge status: Home / Self Care | Payer: Medicare Other | Source: Ambulatory Visit | Attending: Internal Medicine | Admitting: Internal Medicine

## 2016-04-16 LAB — GLUCOSE, CAPILLARY
GLUCOSE-CAPILLARY: 112 mg/dL — AB (ref 65–99)
GLUCOSE-CAPILLARY: 112 mg/dL — AB (ref 65–99)
GLUCOSE-CAPILLARY: 101 mg/dL — AB (ref 65–99)
GLUCOSE-CAPILLARY: 110 mg/dL — AB (ref 65–99)

## 2016-04-16 MED ORDER — GLUCERNA SHAKE PO LIQD: 237.0000 mL | Freq: Two times a day (BID) | ORAL | Status: DC

## 2016-04-16 MED ORDER — TRAMADOL HCL 50 MG PO TABS: 50.0000 mg | ORAL_TABLET | Freq: Four times a day (QID) | ORAL | Status: DC | PRN

## 2016-04-16 MED ORDER — FOSFOMYCIN TROMETHAMINE 3 G PO PACK: 3.0000 g | PACK | ORAL | Status: AC

## 2016-04-16 NOTE — Clinical Social Work Note (Signed)
Clinical Social Work Assessment  Patient Details  Name: Cindy Sullivan MRN: [id number] Date of Birth: [birth year]  Date of referral:  04/16/16               Reason for consult:  Facility Placement                Permission sought to share information with:  Family Supports Permission granted to share information::  Yes, Verbal Permission Granted  Name::     Sallyanne Havers  Relationship::  Brother  Contact Information:  [phone]  Housing/Transportation Living arrangements for the past 2 months:  [place] of Information:  Patient Patient Interpreter Needed:  None Criminal Activity/Legal Involvement Pertinent to Current Situation/Hospitalization:  No - Comment as needed Significant Relationships:  Siblings, Other Family Members, Adult Children Lives with:  Self Do you feel safe going back to the place where you live?  Yes Need for family participation in patient care:  Yes (Comment)  Care giving concerns:  No care giving concerns identified.   Social Worker assessment / plan:  CSW met with pt to address consult for New SNF. CSW introduced herself and explained role of social work. CSW also explained the process of discharging to SNF with Medicare. Pt has been to [facility] in the past and would like to return. Pt's brother lives local and her children live out of state. CSW initiated SNF search and followed up with bed offers. Pt chose [name] CSW updated facility. Pt's brother is aware and he will update the family. Pt also has supportive niece. Per MD, pt will likely discharge to [facility] on Saturday, pending medical clearance. CSW will continue to follow.   Employment status:  Retired Forensic scientist:  Medicare PT Recommendations:  [place] / Referral to community resources:  [place]  Patient/Family's Response to care:  Pt was appreciative of CSW support.   Patient/Family's Understanding of and  Emotional Response to Diagnosis, Current Treatment, and Prognosis:  Pt feels that STR would benefit her prior to returning home.   Emotional Assessment Appearance:  Appears stated age Attitude/Demeanor/Rapport:  Other (Appropriate) Affect (typically observed):  Accepting, Adaptable, Pleasant Orientation:  Oriented to Self, Oriented to Place, Oriented to  Time, Oriented to Situation Alcohol / Substance use:  Never Used Psych involvement (Current and /or in the community):  No (Comment)  Discharge Needs  Concerns to be addressed:  Adjustment to Illness Readmission within the last 30 days:  No Current discharge risk:  None Barriers to Discharge:  Continued Medical Work up   Darden Dates, LCSW 04/16/2016, 4:34 PM

## 2016-04-16 NOTE — Clinical Social Work Placement (Signed)
   CLINICAL SOCIAL WORK PLACEMENT  NOTE  Date:  04/16/2016  Patient Details  Name: Cindy Sullivan MRN: [id number] Date of Birth: Jul 05, 1929  Clinical Social Work is seeking post-discharge placement for this patient at the [place] level of care (*CSW will initial, date and re-position this form in  chart as items are completed):  Yes   Patient/family provided with [place] Work Department's list of facilities offering this level of care within the geographic area requested by the patient (or if unable, by the patient's family).  Yes   Patient/family informed of their freedom to choose among providers that offer the needed level of care, that participate in Medicare, Medicaid or managed care program needed by the patient, have an available bed and are willing to accept the patient.  Yes   Patient/family informed of [facility]'s ownership interest in [facility] and [facility], as well as of the fact that they are under no obligation to receive care at these facilities.  PASRR submitted to EDS on       PASRR number received on       Existing PASRR number confirmed on 04/16/16     FL2 transmitted to all facilities in geographic area requested by pt/family on 04/16/16     FL2 transmitted to all facilities within larger geographic area on       Patient informed that his/her managed care company has contracts with or will negotiate with certain facilities, including the following:        Yes   Patient/family informed of bed offers received.  Patient chooses bed at [facility]     Physician recommends and patient chooses bed at      Patient to be transferred to   on  .  Patient to be transferred to facility by       Patient family notified on   of transfer.  Name of family member notified:        PHYSICIAN       Additional Comment:    _______________________________________________ Darden Dates, LCSW 04/16/2016, 4:42 PM

## 2016-04-16 NOTE — Care Management Important Message (Signed)
Important Message  Patient Details  Name: Cindy Sullivan MRN: [id number] Date of Birth: 01/21/1929   Medicare Important Message Given:  Yes    Juliann Pulse A Quaneshia Wareing 04/16/2016, 11:10 AM

## 2016-04-16 NOTE — Discharge Instructions (Signed)
°  DIET:  Cardiac diet and carbohydrate controlled  DISCHARGE CONDITION:  Stable  ACTIVITY:  Activity as tolerated  OXYGEN:  Home Oxygen: No.   Oxygen Delivery:  Room air  DISCHARGE LOCATION:  nursing home    ADDITIONAL DISCHARGE INSTRUCTION:   If you experience worsening of your admission symptoms, develop shortness of breath, life threatening emergency, suicidal or homicidal thoughts you must seek medical attention immediately by calling 911 or calling your MD immediately  if symptoms less severe.  You Must read complete instructions/literature along with all the possible adverse reactions/side effects for all the Medicines you take and that have been prescribed to you. Take any new Medicines after you have completely understood and accpet all the possible adverse reactions/side effects.   Please note  You were cared for by a hospitalist during your hospital stay. If you have any questions about your discharge medications or the care you received while you were in the hospital after you are discharged, you can call the unit and asked to speak with the hospitalist on call if the hospitalist that took care of you is not available. Once you are discharged, your primary care physician will handle any further medical issues. Please note that NO REFILLS for any discharge medications will be authorized once you are discharged, as it is imperative that you return to your primary care physician (or establish a relationship with a primary care physician if you do not have one) for your aftercare needs so that they can reassess your need for medications and monitor your lab values.

## 2016-04-16 NOTE — NC FL2 (Signed)
[place] LEVEL OF CARE SCREENING TOOL     IDENTIFICATION  Patient Name: Cindy Sullivan Birthdate: January 17, 1929 Sex: female Admission Date (Current Location): 04/13/2016  [place] and [place] Number:  Engineering geologist and Address:  [facility], [address] [place], [place] [address]      Provider Number: [id number]  Attending Physician Name and Address:  Dustin Flock, MD  Relative Name and Phone Number:       Current Level of Care: Hospital Recommended Level of Care: [place] Prior Approval Number:    Date Approved/Denied:   PASRR Number: [id number]  Discharge Plan: SNF    Current Diagnoses: Patient Active Problem List   Diagnosis Date Noted  . UTI (lower urinary tract infection) 04/13/2016  . Other iron deficiency anemias 12/30/2015    Orientation RESPIRATION BLADDER Height & Weight     Self, Situation, Place  Normal Continent Weight: 134 lb 3.2 oz (60.873 kg) Height:  4\' 9"  (144.8 cm)  BEHAVIORAL SYMPTOMS/MOOD NEUROLOGICAL BOWEL NUTRITION STATUS      Continent Diet (Heart Health/Carb Modified, Thin Liquids)  AMBULATORY STATUS COMMUNICATION OF NEEDS Skin   Limited Assist Verbally Normal                       Personal Care Assistance Level of Assistance  Bathing, Feeding, Dressing Bathing Assistance: Limited assistance Feeding assistance: Independent Dressing Assistance: Limited assistance     Functional Limitations Info  Sight, Hearing, Speech Sight Info: Adequate Hearing Info: Adequate Speech Info: Adequate    SPECIAL CARE FACTORS FREQUENCY  PT (By licensed PT)     PT Frequency: 5              Contractures      Additional Factors Info  Code Status, Allergies Code Status Info: Full Code Allergies Info: Codeine, Phenergen           Current Medications (04/16/2016):  This is the current hospital active medication list Current Facility-Administered Medications  Medication  Dose Route Frequency Provider Last Rate Last Dose  . 0.9 %  sodium chloride infusion   Intravenous Continuous Henreitta Leber, MD 75 mL/hr at 04/16/16 0108    . acetaminophen (TYLENOL) tablet 650 mg  650 mg Oral Q6H PRN Henreitta Leber, MD       Or  . acetaminophen (TYLENOL) suppository 650 mg  650 mg Rectal Q6H PRN Henreitta Leber, MD      . acetaminophen (TYLENOL) tablet 500 mg  500 mg Oral TID Henreitta Leber, MD   500 mg at 04/16/16 0907  . atorvastatin (LIPITOR) tablet 20 mg  20 mg Oral QHS Henreitta Leber, MD   20 mg at 04/15/16 2107  . enoxaparin (LOVENOX) injection 40 mg  40 mg Subcutaneous Q24H Crystal Jennefer Bravo, RPH   40 mg at 04/15/16 2107  . feeding supplement (GLUCERNA SHAKE) (GLUCERNA SHAKE) liquid 237 mL  237 mL Oral BID BM Dustin Flock, MD   237 mL at 04/16/16 1000  . hydrALAZINE (APRESOLINE) injection 5 mg  5 mg Intravenous Q6H PRN Quintella Baton, MD   5 mg at 04/15/16 0528  . insulin aspart (novoLOG) injection 0-5 Units  0-5 Units Subcutaneous QHS Henreitta Leber, MD   0 Units at 04/13/16 2155  . insulin aspart (novoLOG) injection 0-9 Units  0-9 Units Subcutaneous TID WC Henreitta Leber, MD   2 Units at 04/15/16 1635  . insulin aspart protamine- aspart (  NOVOLOG MIX 70/30) injection 10 Units  10 Units Subcutaneous Q breakfast Henreitta Leber, MD   10 Units at 04/16/16 [phone]  . irbesartan (AVAPRO) tablet 150 mg  150 mg Oral Daily Henreitta Leber, MD   150 mg at 04/16/16 0907  . memantine (NAMENDA) tablet 5 mg  5 mg Oral BID Henreitta Leber, MD   5 mg at 04/16/16 0907  . morphine 2 MG/ML injection 1 mg  1 mg Intravenous Q4H PRN Dustin Flock, MD   1 mg at 04/16/16 0907  . ondansetron (ZOFRAN) tablet 4 mg  4 mg Oral Q6H PRN Henreitta Leber, MD       Or  . ondansetron [facility]) injection 4 mg  4 mg Intravenous Q6H PRN Henreitta Leber, MD   4 mg at 04/15/16 1618  . oxybutynin (DITROPAN-XL) 24 hr tablet 5 mg  5 mg Oral QHS Henreitta Leber, MD   5 mg at 04/15/16 2107  . pantoprazole  (PROTONIX) EC tablet 40 mg  40 mg Oral Daily Henreitta Leber, MD   40 mg at 04/16/16 0907  . piperacillin-tazobactam (ZOSYN) IVPB 3.375 g  3.375 g Intravenous Q8H Dustin Flock, MD   3.375 g at 04/16/16 0502  . temazepam (RESTORIL) capsule 7.5 mg  7.5 mg Oral QHS PRN Henreitta Leber, MD   7.5 mg at 04/15/16 2107  . traMADol (ULTRAM) tablet 50 mg  50 mg Oral Q6H PRN Henreitta Leber, MD   50 mg at 04/16/16 0458   Facility-Administered Medications Ordered in Other Encounters  Medication Dose Route Frequency Provider Last Rate Last Dose  . sodium chloride 0.9 % injection 10 mL  10 mL Intravenous PRN Leia Alf, MD   10 mL at 03/03/15 1528     Discharge Medications: Please see discharge summary for a list of discharge medications.  Relevant Imaging Results:  Relevant Lab Results:   Additional Information SSN:  [id number]  Darden Dates, LCSW

## 2016-04-16 NOTE — Progress Notes (Signed)
[place] at [facility]                                                                                                                                                                                            Patient Demographics   Cindy Sullivan, is a 82 y.o. female, DOB - 1929/08/27, [id number]  Admit date - 04/13/2016   Admitting Physician Henreitta Leber, MD  Outpatient Primary MD for the patient is Singh,Jasmine, MD   LOS - 2  Subjective: Very weak urinary frequency better and burning better   Review of Systems:   CONSTITUTIONAL: No documented fever. No fatigue, weakness. No weight gain, no weight loss.  EYES: No blurry or double vision.  ENT: No tinnitus. No postnasal drip. No redness of the oropharynx.  RESPIRATORY: No cough, no wheeze, no hemoptysis. No dyspnea.  CARDIOVASCULAR: No chest pain. No orthopnea. No palpitations. No syncope.  GASTROINTESTINAL: No nausea, no vomiting or diarrhea. No abdominal pain. No melena or hematochezia.  GENITOURINARY:Frequency urgency ENDOCRINE: No polyuria or nocturia. No heat or cold intolerance.  HEMATOLOGY: No anemia. No bruising. No bleeding.  INTEGUMENTARY: No rashes. No lesions.  MUSCULOSKELETAL: No arthritis. No swelling. No gout.  NEUROLOGIC: No numbness, tingling, or ataxia. No seizure-type activity.  PSYCHIATRIC: No anxiety. No insomnia. No ADD.    Vitals:   Filed Vitals:   04/15/16 0619 04/15/16 1531 04/15/16 2037 04/16/16 0547  BP: 149/62 155/84 134/63 149/63  Pulse: 101 92 91 85  Temp:  98 F (36.7 C) 98.4 F (36.9 C) 98.1 F (36.7 C)  TempSrc:  Oral Oral Oral  Resp:  18 20 18   Height:      Weight:      SpO2:  97% 96% 99%    Wt Readings from Last 3 Encounters:  04/13/16 60.873 kg (134 lb 3.2 oz)    No intake or output data in the 24 hours ending 04/16/16 1247  Physical Exam:   GENERAL: Pleasant-appearing in no apparent distress.  HEAD, EYES, EARS, NOSE AND THROAT:  Atraumatic, normocephalic. Extraocular muscles are intact. Pupils equal and reactive to light. Sclerae anicteric. No conjunctival injection. No oro-pharyngeal erythema.  NECK: Supple. There is no jugular venous distention. No bruits, no lymphadenopathy, no thyromegaly.  HEART: Regular rate and rhythm,. No murmurs, no rubs, no clicks.  LUNGS: Clear to auscultation bilaterally. No rales or rhonchi. No wheezes.  ABDOMEN: Soft, flat, nontender, nondistended. Has good bowel sounds. No hepatosplenomegaly appreciated.  EXTREMITIES: No evidence of any cyanosis, clubbing, or peripheral edema.  +2 pedal and radial pulses bilaterally.  NEUROLOGIC: The patient is alert, awake,  and oriented x3 with no focal motor or sensory deficits appreciated bilaterally.  SKIN: Moist and warm with no rashes appreciated.  Psych: Not anxious, depressed LN: No inguinal LN enlargement    Antibiotics   Anti-infectives    Start     Dose/Rate Route Frequency Ordered Stop   04/15/16 1500  piperacillin-tazobactam (ZOSYN) IVPB 3.375 g     3.375 g 12.5 mL/hr over 240 Minutes Intravenous Every 8 hours 04/15/16 1435     04/15/16 0900  ciprofloxacin (CIPRO) IVPB 400 mg  Status:  Discontinued     400 mg 200 mL/hr over 60 Minutes Intravenous Every 12 hours 04/15/16 0857 04/15/16 1433   04/13/16 2130  cefTRIAXone (ROCEPHIN) 1 g in dextrose 5 % 50 mL IVPB  Status:  Discontinued     1 g 100 mL/hr over 30 Minutes Intravenous Every 24 hours 04/13/16 2115 04/15/16 0857   04/13/16 1730  piperacillin-tazobactam (ZOSYN) IVPB 3.375 g     3.375 g 100 mL/hr over 30 Minutes Intravenous  Once 04/13/16 1722 04/13/16 1804      Medications   Scheduled Meds: . acetaminophen  500 mg Oral TID  . atorvastatin  20 mg Oral QHS  . enoxaparin (LOVENOX) injection  40 mg Subcutaneous Q24H  . feeding supplement (GLUCERNA SHAKE)  237 mL Oral BID BM  . insulin aspart  0-5 Units Subcutaneous QHS  . insulin aspart  0-9 Units Subcutaneous TID WC  .  insulin aspart protamine- aspart  10 Units Subcutaneous Q breakfast  . irbesartan  150 mg Oral Daily  . memantine  5 mg Oral BID  . oxybutynin  5 mg Oral QHS  . pantoprazole  40 mg Oral Daily  . piperacillin-tazobactam (ZOSYN)  IV  3.375 g Intravenous Q8H   Continuous Infusions: . sodium chloride 75 mL/hr at 04/16/16 0108   PRN Meds:.acetaminophen **OR** acetaminophen, hydrALAZINE, morphine injection, ondansetron **OR** ondansetron (ZOFRAN) IV, temazepam, traMADol   Data Review:   Micro Results Recent Results (from the past 240 hour(s))  Urine culture     Status: Abnormal   Collection Time: 04/13/16  6:01 PM  Result Value Ref Range Status   Specimen Description URINE, RANDOM  Final   Special Requests NONE  Final   Culture (A)  Final    >=100,000 COLONIES/mL PSEUDOMONAS AERUGINOSA 50,000 COLONIES/mL CITROBACTER FREUNDII    Report Status 04/15/2016 FINAL  Final   Organism ID, Bacteria PSEUDOMONAS AERUGINOSA (A)  Final   Organism ID, Bacteria CITROBACTER FREUNDII (A)  Final      Susceptibility   Citrobacter freundii - MIC*    CEFAZOLIN >=64 RESISTANT Resistant     CEFTRIAXONE <=1 SENSITIVE Sensitive     CIPROFLOXACIN <=0.25 SENSITIVE Sensitive     GENTAMICIN <=1 SENSITIVE Sensitive     IMIPENEM <=0.25 SENSITIVE Sensitive     NITROFURANTOIN <=16 SENSITIVE Sensitive     TRIMETH/SULFA <=20 SENSITIVE Sensitive     PIP/TAZO <=4 SENSITIVE Sensitive     * 50,000 COLONIES/mL CITROBACTER FREUNDII   Pseudomonas aeruginosa - MIC*    CEFTAZIDIME 4 SENSITIVE Sensitive     CIPROFLOXACIN <=0.25 SENSITIVE Sensitive     GENTAMICIN <=1 SENSITIVE Sensitive     IMIPENEM 2 SENSITIVE Sensitive     PIP/TAZO 8 SENSITIVE Sensitive     CEFEPIME 4 SENSITIVE Sensitive     * >=100,000 COLONIES/mL PSEUDOMONAS AERUGINOSA    Radiology Reports Ct Renal Stone Study  04/13/2016  CLINICAL DATA:  urine culture results showed that patient needed to come to  the hospital for IV antibiotics. Patient is  complaining of back pain and dysuria. Patient states, "the pain is so bad I cry." EXAM: CT ABDOMEN AND PELVIS WITHOUT CONTRAST TECHNIQUE: Multidetector CT imaging of the abdomen and pelvis was performed following the standard protocol without IV contrast. COMPARISON:  11/27/2012 FINDINGS: Lower chest: Mitral annulus calcifications. No pleural or pericardial effusion. Visualized lung bases clear. Hepatobiliary: Previous cholecystectomy. Mild intra and extrahepatic biliary ductal dilatation, slightly more conspicuous than on prior study. No focal liver lesion on this unenhanced exam. Chronic focal calcification in segment 6. Pancreas: Mild diffuse parenchymal atrophy without focal lesion or ductal dilatation. Spleen: Within normal limits in size. Adrenals/Urinary Tract: 2.7 cm left lower pole lesion measures near fluid attenuation, present since scans dating back to 09/14/2011 with mild enlargement. No nephrolithiasis. No hydronephrosis. Thick-walled urinary bladder, partially distended. Some gas in the urinary bladder presumably from recent instrumentation. Stomach/Bowel: Small hiatal hernia. Stomach, small bowel, and colon are nondilated. A few small scattered distal descending and sigmoid diverticula without adjacent inflammatory/ edematous change. Fecal distention of the rectum. Vascular/Lymphatic: Heavy aortoiliac arterial calcifications without aneurysm. No adenopathy localized. Reproductive: Previous hysterectomy. Other: No ascites.  No free air. Musculoskeletal: Mild spondylitic changes in the lower thoracic and lumbar spine. No fracture. IMPRESSION: 1. Thickening of the wall of the urinary bladder suggesting cystitis. Intraluminal gas presumably secondary to recent instrumentation. 2. Fecal distention of the rectum without obstruction. 3. Descending and sigmoid diverticulosis. 4. Small hiatal hernia. 5. Left lower pole renal lesion, possibly benign cyst but incompletely characterized. Electronically Signed    By: Lucrezia Europe M.D.   On: 04/13/2016 17:29     CBC  Recent Labs Lab 04/13/16 1324 04/14/16 0516 04/15/16 0521  WBC 13.3* 13.7* 9.7  HGB 12.0 10.0* 10.8*  HCT 36.7 30.2* 31.9*  PLT 426 340 346  MCV 86.2 85.7 86.4  MCH 28.3 28.5 29.3  MCHC 32.8 33.3 33.9  RDW 14.5 14.6* 14.6*    Chemistries   Recent Labs Lab 04/13/16 1324 04/14/16 0516 04/15/16 0521  NA 129* 132* 134*  K 4.9 4.2 4.1  CL 96* 101 102  CO2 25 27 26   GLUCOSE 177* 106* 102*  BUN 19 15 10   CREATININE 1.14* 0.99 0.69  CALCIUM 8.7* 7.9* 8.2*   ------------------------------------------------------------------------------------------------------------------ estimated creatinine clearance is 38.6 mL/min (by C-G formula based on Cr of 0.69). ------------------------------------------------------------------------------------------------------------------ No results for input(s): HGBA1C in the last 72 hours. ------------------------------------------------------------------------------------------------------------------ No results for input(s): CHOL, HDL, LDLCALC, TRIG, CHOLHDL, LDLDIRECT in the last 72 hours. ------------------------------------------------------------------------------------------------------------------ No results for input(s): TSH, T4TOTAL, T3FREE, THYROIDAB in the last 72 hours.  Invalid input(s): FREET3 ------------------------------------------------------------------------------------------------------------------ No results for input(s): VITAMINB12, FOLATE, FERRITIN, TIBC, IRON, RETICCTPCT in the last 72 hours.  Coagulation profile No results for input(s): INR, PROTIME in the last 168 hours.  No results for input(s): DDIMER in the last 72 hours.  Cardiac Enzymes No results for input(s): CKMB, TROPONINI, MYOGLOBIN in the last 168 hours.  Invalid input(s): CK ------------------------------------------------------------------------------------------------------------------ Invalid  input(s): POCBNP    Assessment & Plan   82 year old female with past medical history of an deficiency anemia, GERD, hypertension, hyperlipidemia, non-insulin-dependent diabetes, history of GI bleed, chronic back pain presents to the hospital due to dysuria, frequency and noted to have urinary tract infection.  1. Urinary tract infection- Due pseudomonas 1 more day of IV Zosyn changed to oral fosfomycin on discharge   2. Leukocytosis-secondary to UTI. Now resolved  3. Hyponatremia-hypovolemic in nature.  Improved 4. Acute kidney  injury-resolved with IV hydration  5. Dementia-continue Namenda.  6. Diabetes type 2 without complication-continue Novolin 70/30, sliding scale insulin.  7. GERD - cont. Protonix.   8. Urinary Incontinence - cont. Oxybutynin.   9. HTN - cont. Avapro.  Discharge to rehabilitation tomorrow    Code Status Orders        Start     Ordered   04/13/16 2116  Full code   Continuous     04/13/16 2115    Code Status History    Date Active Date Inactive Code Status Order ID Comments User Context   This patient has a current code status but no historical code status.           Consults None DVT Prophylaxis  Lovenox   Lab Results  Component Value Date   PLT 346 04/15/2016     Time Spent in minutes   82min  Greater than 50% of time spent in care coordination and counseling patient regarding the condition and plan of care.   Dustin Flock M.D on 04/16/2016 at 12:47 PM  Between 7am to 6pm - Pager - [phone]  After 6pm go to [email or web address] - password [email or web address] [place] [facility]   Office  [phone]

## 2016-04-17 LAB — GLUCOSE, CAPILLARY
GLUCOSE-CAPILLARY: 114 mg/dL — AB (ref 65–99)
GLUCOSE-CAPILLARY: 68 mg/dL (ref 65–99)
GLUCOSE-CAPILLARY: 173 mg/dL — AB (ref 65–99)
Glucose-Capillary: 144 mg/dL — ABNORMAL HIGH (ref 65–99)

## 2016-04-17 NOTE — Clinical Social Work Placement (Signed)
   CLINICAL SOCIAL WORK PLACEMENT  NOTE  Date:  04/17/2016  Patient Details  Name: Cindy Sullivan MRN: [id number] Date of Birth: 1929/04/19  Clinical Social Work is seeking post-discharge placement for this patient at the [employer] level of care (*CSW will initial, date and re-position this form in  chart as items are completed):  Yes   Patient/family provided with [facility] Work Department's list of facilities offering this level of care within the geographic area requested by the patient (or if unable, by the patient's family).  Yes   Patient/family informed of their freedom to choose among providers that offer the needed level of care, that participate in Medicare, Medicaid or managed care program needed by the patient, have an available bed and are willing to accept the patient.  Yes   Patient/family informed of [facility]'s ownership interest in [facility] and [facility], as well as of the fact that they are under no obligation to receive care at these facilities.  PASRR submitted to EDS on       PASRR number received on       Existing PASRR number confirmed on 04/16/16     FL2 transmitted to all facilities in geographic area requested by pt/family on 04/16/16     FL2 transmitted to all facilities within larger geographic area on       Patient informed that his/her managed care company has contracts with or will negotiate with certain facilities, including the following:        Yes   Patient/family informed of bed offers received.  Patient chooses bed at [facility]     Physician recommends and patient chooses bed at  [facility])    Patient to be transferred to  [facility]) on 04/17/16.  Patient to be transferred to facility by  (niece)     Patient family notified on 04/17/16 of transfer.  Name of family member notified:  patient's son and patient's niece     PHYSICIAN       Additional Comment:     _______________________________________________ Shela Leff, LCSW 04/17/2016, 11:50 AM

## 2016-04-17 NOTE — Progress Notes (Signed)
Pt is alert with intermittent confusion, c/o back pain improved with tramadol, poor appetite, on room air, incontinent of urine/stool, bm in am, niece at bedside, d/c to [facility], will continue on po antibiotics for 9 days, pt transported via niece, Rx for tramadol sent in d/c packet, hgb stable at d/c, report given to nachelle. Uneventful shift.

## 2016-04-17 NOTE — Clinical Social Work Note (Signed)
Patient discharging to [facility] as planned. CSW sent discharge information through epic as instructed by their admissions coordinator today. Nurse to call report. Patient to check with her niece to see if she can transport as CSW explained to patient that coverage by her insurance is not guaranteed for EMS transport.  Shela Leff MSW,LCSW [phone]

## 2016-04-17 NOTE — Discharge Summary (Signed)
Cindy Sullivan, 82 y.o., DOB August 30, 1929, MRN [id number]. Admission date: 04/13/2016 Discharge Date 04/17/2016 Primary MD Glendon Axe, MD Admitting Physician Henreitta Leber, MD  Admission Diagnosis  Hyponatremia [E87.1] UTI (lower urinary tract infection) [N39.0] Renal insufficiency [N28.9]  Discharge Diagnosis   Active Problems:   UTI (lower urinary tract infection) due to pseudomonas  Generalized Weakness   Acute kidney injury due to dehydration Hyponatremia Dementia Type 2 diabetes Urinary incontinence        Hospital Course Cindy Sullivan is a 82 y.o. female with a known history of Iron deficiency anemia, GERD, hypertension, hyperlipidemia, non-insulin-dependent diabetes, chronic back pain, dementia presents to the hospital due to dysuria, frequency in urination which has been progressive over the past few days. Patient was recently treated the with Levaquin as outpatient urine culture showed Pseudomonas due to failure to improve she was referred to the hospital for admission. Patient was admitted here and started on antibiotics. Urine cultures here showed Pseudomonas sensitive to Cipro. However I discussed the case with Dr. Ola Spurr who recommended that patient should be placed on Fosphomycin.  She will be treated with this or antibiotics. She was also noticed to have acute renal failure which was treated with IV fluids with resolution of renal failure. Patient is very weak and deconditioned and in need of rehabilitation.            Consults  None  Significant Tests:  See full reports for all details      Ct Renal Stone Study  04/13/2016  CLINICAL DATA:  urine culture results showed that patient needed to come to the hospital for IV antibiotics. Patient is complaining of back pain and dysuria. Patient states, "the pain is so bad I cry." EXAM: CT ABDOMEN AND PELVIS WITHOUT CONTRAST TECHNIQUE: Multidetector CT imaging of the abdomen and pelvis was performed following the  standard protocol without IV contrast. COMPARISON:  11/27/2012 FINDINGS: Lower chest: Mitral annulus calcifications. No pleural or pericardial effusion. Visualized lung bases clear. Hepatobiliary: Previous cholecystectomy. Mild intra and extrahepatic biliary ductal dilatation, slightly more conspicuous than on prior study. No focal liver lesion on this unenhanced exam. Chronic focal calcification in segment 6. Pancreas: Mild diffuse parenchymal atrophy without focal lesion or ductal dilatation. Spleen: Within normal limits in size. Adrenals/Urinary Tract: 2.7 cm left lower pole lesion measures near fluid attenuation, present since scans dating back to 09/14/2011 with mild enlargement. No nephrolithiasis. No hydronephrosis. Thick-walled urinary bladder, partially distended. Some gas in the urinary bladder presumably from recent instrumentation. Stomach/Bowel: Small hiatal hernia. Stomach, small bowel, and colon are nondilated. A few small scattered distal descending and sigmoid diverticula without adjacent inflammatory/ edematous change. Fecal distention of the rectum. Vascular/Lymphatic: Heavy aortoiliac arterial calcifications without aneurysm. No adenopathy localized. Reproductive: Previous hysterectomy. Other: No ascites.  No free air. Musculoskeletal: Mild spondylitic changes in the lower thoracic and lumbar spine. No fracture. IMPRESSION: 1. Thickening of the wall of the urinary bladder suggesting cystitis. Intraluminal gas presumably secondary to recent instrumentation. 2. Fecal distention of the rectum without obstruction. 3. Descending and sigmoid diverticulosis. 4. Small hiatal hernia. 5. Left lower pole renal lesion, possibly benign cyst but incompletely characterized. Electronically Signed   By: Lucrezia Europe M.D.   On: 04/13/2016 17:29       Today   Subjective:   Cindy Sullivan  patient is very weak but other than that is doing better  Objective:   Blood pressure 151/64, pulse 78, temperature 98.2  F (36.8 C), temperature source Oral, resp.  rate 18, height 4\' 9"  (1.448 m), weight 60.873 kg (134 lb 3.2 oz), SpO2 99 %.  .  Intake/Output Summary (Last 24 hours) at 04/17/16 1048 Last data filed at 04/17/16 0800  Gross per 24 hour  Intake     50 ml  Output    100 ml  Net    -50 ml    Exam VITAL SIGNS: Blood pressure 151/64, pulse 78, temperature 98.2 F (36.8 C), temperature source Oral, resp. rate 18, height 4\' 9"  (1.448 m), weight 60.873 kg (134 lb 3.2 oz), SpO2 99 %.  GENERAL:  82 y.o.-year-old patient lying in the bed with no acute distress.  EYES: Pupils equal, round, reactive to light and accommodation. No scleral icterus. Extraocular muscles intact.  HEENT: Head atraumatic, normocephalic. Oropharynx and nasopharynx clear.  NECK:  Supple, no jugular venous distention. No thyroid enlargement, no tenderness.  LUNGS: Normal breath sounds bilaterally, no wheezing, rales,rhonchi or crepitation. No use of accessory muscles of respiration.  CARDIOVASCULAR: S1, S2 normal. No murmurs, rubs, or gallops.  ABDOMEN: Soft, nontender, nondistended. Bowel sounds present. No organomegaly or mass.  EXTREMITIES: No pedal edema, cyanosis, or clubbing.  NEUROLOGIC: Cranial nerves II through XII are intact. Muscle strength 5/5 in all extremities. Sensation intact. Gait not checked.  PSYCHIATRIC: The patient is alert and oriented x 3.  SKIN: No obvious rash, lesion, or ulcer.   Data Review     CBC w Diff: Lab Results  Component Value Date   WBC 9.7 04/15/2016   WBC 11.8* 12/02/2014   HGB 10.8* 04/15/2016   HGB 10.9* 12/02/2014   HCT 31.9* 04/15/2016   HCT 32.5* 12/02/2014   PLT 346 04/15/2016   PLT 419 12/02/2014   LYMPHOPCT 31 12/29/2015   LYMPHOPCT 29.2 12/02/2014   MONOPCT 9 12/29/2015   MONOPCT 9.5 12/02/2014   EOSPCT 2 12/29/2015   EOSPCT 1.1 12/02/2014   BASOPCT 1 12/29/2015   BASOPCT 0.6 12/02/2014   CMP: Lab Results  Component Value Date   NA 134* 04/15/2016   NA 134*  05/20/2014   K 4.1 04/15/2016   K 3.5 05/20/2014   CL 102 04/15/2016   CL 102 05/20/2014   CO2 26 04/15/2016   CO2 23 05/20/2014   BUN 10 04/15/2016   BUN 23* 05/20/2014   CREATININE 0.69 04/15/2016   CREATININE 1.12 05/20/2014   PROT 6.7 05/17/2014   PROT 6.7 05/17/2014   ALBUMIN 2.2* 05/17/2014   ALBUMIN 2.2* 05/17/2014   BILITOT 0.3 05/17/2014   BILITOT 0.3 05/17/2014   ALKPHOS 64 05/17/2014   ALKPHOS 65 05/17/2014   AST 117* 05/17/2014   AST 116* 05/17/2014   ALT 29 05/17/2014   ALT 29 05/17/2014  .  Micro Results Recent Results (from the past 240 hour(s))  Urine culture     Status: Abnormal   Collection Time: 04/13/16  6:01 PM  Result Value Ref Range Status   Specimen Description URINE, RANDOM  Final   Special Requests NONE  Final   Culture (A)  Final    >=100,000 COLONIES/mL PSEUDOMONAS AERUGINOSA 50,000 COLONIES/mL CITROBACTER FREUNDII    Report Status 04/15/2016 FINAL  Final   Organism ID, Bacteria PSEUDOMONAS AERUGINOSA (A)  Final   Organism ID, Bacteria CITROBACTER FREUNDII (A)  Final      Susceptibility   Citrobacter freundii - MIC*    CEFAZOLIN >=64 RESISTANT Resistant     CEFTRIAXONE <=1 SENSITIVE Sensitive     CIPROFLOXACIN <=0.25 SENSITIVE Sensitive     GENTAMICIN <=1  SENSITIVE Sensitive     IMIPENEM <=0.25 SENSITIVE Sensitive     NITROFURANTOIN <=16 SENSITIVE Sensitive     TRIMETH/SULFA <=20 SENSITIVE Sensitive     PIP/TAZO <=4 SENSITIVE Sensitive     * 50,000 COLONIES/mL CITROBACTER FREUNDII   Pseudomonas aeruginosa - MIC*    CEFTAZIDIME 4 SENSITIVE Sensitive     CIPROFLOXACIN <=0.25 SENSITIVE Sensitive     GENTAMICIN <=1 SENSITIVE Sensitive     IMIPENEM 2 SENSITIVE Sensitive     PIP/TAZO 8 SENSITIVE Sensitive     CEFEPIME 4 SENSITIVE Sensitive     * >=100,000 COLONIES/mL PSEUDOMONAS AERUGINOSA        Code Status Orders        Start     Ordered   04/13/16 2116  Full code   Continuous     04/13/16 2115    Code Status History     Date Active Date Inactive Code Status Order ID Comments User Context   This patient has a current code status but no historical code status.          Follow-up Information    Follow up with Singh,Jasmine, MD In 10 days.   Specialty:  Internal Medicine   Contact information:   [place] [address] [phone]       Discharge Medications     Medication List    TAKE these medications        acetaminophen 500 MG tablet  Commonly known as:  TYLENOL  Take 500 mg by mouth 3 (three) times daily.     atorvastatin 20 MG tablet  Commonly known as:  LIPITOR  Take 20 mg by mouth at bedtime.     feeding supplement (GLUCERNA SHAKE) Liqd  Take 237 mLs by mouth 2 (two) times daily between meals.     fosfomycin 3 g Pack  Commonly known as:  MONUROL  Take 3 g by mouth every 3 (three) days. Patient need total 3 doses totaling 9 days threpy     insulin regular 100 units/mL injection  Commonly known as:  NOVOLIN R,HUMULIN R  Inject 2-10 Units into the skin 3 (three) times daily with meals as needed for high blood sugar. Pt uses as needed per sliding scale:    151-200:  2 units  201-250:  4 units 251-300:  6 units  301-350:  8 units 351-400:  10 units     irbesartan 150 MG tablet  Commonly known as:  AVAPRO  Take 150 mg by mouth daily.     memantine 5 MG tablet  Commonly known as:  NAMENDA  Take 5 mg by mouth 2 (two) times daily.     NOVOLIN 70/30 (70-30) 100 UNIT/ML injection  Generic drug:  insulin NPH-regular Human  Inject 3-10 Units into the skin 2 (two) times daily. Pt uses 10 units in the morning and 3 units in the evening.     omeprazole 40 MG capsule  Commonly known as:  PRILOSEC  Take 40 mg by mouth daily.     oxybutynin 5 MG 24 hr tablet  Commonly known as:  DITROPAN-XL  Take 5 mg by mouth at bedtime.     temazepam 7.5 MG capsule  Commonly known as:  RESTORIL  Take 7.5 mg by mouth at bedtime as needed for sleep.      traMADol 50 MG tablet  Commonly known as:  ULTRAM  Take 1 tablet (50 mg total) by mouth every 6 (six) hours  as needed for moderate pain.           Total Time in preparing paper work, data evaluation and todays exam - 35 minutes  Dustin Flock M.D on 04/17/2016 at 10:48 AM  [facility]   Office  [phone]

## 2016-04-18 LAB — GLUCOSE, CAPILLARY
GLUCOSE-CAPILLARY: 121 mg/dL — AB (ref 65–99)
GLUCOSE-CAPILLARY: 220 mg/dL — AB (ref 65–99)
GLUCOSE-CAPILLARY: 149 mg/dL — AB (ref 65–99)

## 2016-04-19 LAB — GLUCOSE, CAPILLARY: GLUCOSE-CAPILLARY: 216 mg/dL — AB (ref 65–99)

## 2016-04-20 LAB — GLUCOSE, CAPILLARY
GLUCOSE-CAPILLARY: 169 mg/dL — AB (ref 65–99)
GLUCOSE-CAPILLARY: 197 mg/dL — AB (ref 65–99)
GLUCOSE-CAPILLARY: 116 mg/dL — AB (ref 65–99)
GLUCOSE-CAPILLARY: 135 mg/dL — AB (ref 65–99)
Glucose-Capillary: 145 mg/dL — ABNORMAL HIGH (ref 65–99)
Glucose-Capillary: 173 mg/dL — ABNORMAL HIGH (ref 65–99)

## 2016-04-21 LAB — GLUCOSE, CAPILLARY
GLUCOSE-CAPILLARY: 125 mg/dL — AB (ref 65–99)
GLUCOSE-CAPILLARY: 164 mg/dL — AB (ref 65–99)
GLUCOSE-CAPILLARY: 180 mg/dL — AB (ref 65–99)
Glucose-Capillary: 187 mg/dL — ABNORMAL HIGH (ref 65–99)
Glucose-Capillary: 156 mg/dL — ABNORMAL HIGH (ref 65–99)

## 2016-04-22 LAB — GLUCOSE, CAPILLARY
GLUCOSE-CAPILLARY: 121 mg/dL — AB (ref 65–99)
GLUCOSE-CAPILLARY: 149 mg/dL — AB (ref 65–99)
GLUCOSE-CAPILLARY: 188 mg/dL — AB (ref 65–99)
Glucose-Capillary: 218 mg/dL — ABNORMAL HIGH (ref 65–99)

## 2016-04-23 LAB — GLUCOSE, CAPILLARY
GLUCOSE-CAPILLARY: 136 mg/dL — AB (ref 65–99)
GLUCOSE-CAPILLARY: 157 mg/dL — AB (ref 65–99)

## 2016-04-24 ENCOUNTER — Encounter
Admission: RE | Admit: 2016-04-24 | Discharge: 2016-04-24 | Disposition: A | Discharge status: Home / Self Care | Payer: Medicare Other | Source: Ambulatory Visit | Attending: Internal Medicine | Admitting: Internal Medicine

## 2016-04-24 LAB — GLUCOSE, CAPILLARY
GLUCOSE-CAPILLARY: 114 mg/dL — AB (ref 65–99)
GLUCOSE-CAPILLARY: 177 mg/dL — AB (ref 65–99)
Glucose-Capillary: 147 mg/dL — ABNORMAL HIGH (ref 65–99)
Glucose-Capillary: 144 mg/dL — ABNORMAL HIGH (ref 65–99)
Glucose-Capillary: 153 mg/dL — ABNORMAL HIGH (ref 65–99)

## 2016-04-26 LAB — GLUCOSE, CAPILLARY
GLUCOSE-CAPILLARY: 139 mg/dL — AB (ref 65–99)
GLUCOSE-CAPILLARY: 102 mg/dL — AB (ref 65–99)
GLUCOSE-CAPILLARY: 106 mg/dL — AB (ref 65–99)
GLUCOSE-CAPILLARY: 97 mg/dL (ref 65–99)
GLUCOSE-CAPILLARY: 180 mg/dL — AB (ref 65–99)
Glucose-Capillary: 178 mg/dL — ABNORMAL HIGH (ref 65–99)
Glucose-Capillary: 159 mg/dL — ABNORMAL HIGH (ref 65–99)
Glucose-Capillary: 153 mg/dL — ABNORMAL HIGH (ref 65–99)

## 2016-04-27 LAB — GLUCOSE, CAPILLARY
GLUCOSE-CAPILLARY: 104 mg/dL — AB (ref 65–99)
GLUCOSE-CAPILLARY: 151 mg/dL — AB (ref 65–99)
GLUCOSE-CAPILLARY: 162 mg/dL — AB (ref 65–99)
Glucose-Capillary: 125 mg/dL — ABNORMAL HIGH (ref 65–99)

## 2016-04-28 LAB — GLUCOSE, CAPILLARY
GLUCOSE-CAPILLARY: 139 mg/dL — AB (ref 65–99)
Glucose-Capillary: 108 mg/dL — ABNORMAL HIGH (ref 65–99)
Glucose-Capillary: 141 mg/dL — ABNORMAL HIGH (ref 65–99)
Glucose-Capillary: 166 mg/dL — ABNORMAL HIGH (ref 65–99)

## 2016-04-29 LAB — GLUCOSE, CAPILLARY
GLUCOSE-CAPILLARY: 105 mg/dL — AB (ref 65–99)
GLUCOSE-CAPILLARY: 126 mg/dL — AB (ref 65–99)
Glucose-Capillary: 93 mg/dL (ref 65–99)
Glucose-Capillary: 158 mg/dL — ABNORMAL HIGH (ref 65–99)

## 2016-04-30 LAB — GLUCOSE, CAPILLARY
GLUCOSE-CAPILLARY: 90 mg/dL (ref 65–99)
GLUCOSE-CAPILLARY: 142 mg/dL — AB (ref 65–99)
GLUCOSE-CAPILLARY: 190 mg/dL — AB (ref 65–99)
Glucose-Capillary: 109 mg/dL — ABNORMAL HIGH (ref 65–99)

## 2016-05-01 LAB — GLUCOSE, CAPILLARY
GLUCOSE-CAPILLARY: 121 mg/dL — AB (ref 65–99)
GLUCOSE-CAPILLARY: 197 mg/dL — AB (ref 65–99)
Glucose-Capillary: 100 mg/dL — ABNORMAL HIGH (ref 65–99)
Glucose-Capillary: 91 mg/dL (ref 65–99)

## 2016-05-02 LAB — GLUCOSE, CAPILLARY
GLUCOSE-CAPILLARY: 112 mg/dL — AB (ref 65–99)
GLUCOSE-CAPILLARY: 125 mg/dL — AB (ref 65–99)
GLUCOSE-CAPILLARY: 128 mg/dL — AB (ref 65–99)
Glucose-Capillary: 126 mg/dL — ABNORMAL HIGH (ref 65–99)

## 2016-05-03 ENCOUNTER — Inpatient Hospital Stay: Payer: Medicare Other | Attending: Internal Medicine

## 2016-05-03 LAB — GLUCOSE, CAPILLARY
GLUCOSE-CAPILLARY: 119 mg/dL — AB (ref 65–99)
GLUCOSE-CAPILLARY: 111 mg/dL — AB (ref 65–99)
GLUCOSE-CAPILLARY: 130 mg/dL — AB (ref 65–99)

## 2016-05-04 LAB — GLUCOSE, CAPILLARY
GLUCOSE-CAPILLARY: 152 mg/dL — AB (ref 65–99)
Glucose-Capillary: 146 mg/dL — ABNORMAL HIGH (ref 65–99)
Glucose-Capillary: 105 mg/dL — ABNORMAL HIGH (ref 65–99)
Glucose-Capillary: 174 mg/dL — ABNORMAL HIGH (ref 65–99)
Glucose-Capillary: 159 mg/dL — ABNORMAL HIGH (ref 65–99)

## 2016-05-05 ENCOUNTER — Non-Acute Institutional Stay (SKILLED_NURSING_FACILITY): Payer: Medicare Other | Admitting: Gerontology

## 2016-05-05 DIAGNOSIS — N390 Urinary tract infection, site not specified: Secondary | ICD-10-CM | POA: Diagnosis not present

## 2016-05-05 LAB — GLUCOSE, CAPILLARY: Glucose-Capillary: 124 mg/dL — ABNORMAL HIGH (ref 65–99)

## 2016-05-05 NOTE — Progress Notes (Signed)
Location:  [facility]   Place of Service:  SNF (31)  Provider:  , NP-C  PCP: Singh,Jasmine, MD Patient Care Team: [name], MD as PCP - General (Internal Medicine)  Extended Emergency Contact Information Primary Emergency Contact: Renigar,Paul  [place] Home Phone: [phone] Relation: Brother  Code Status: Full Goals of care:  Advanced Directive information Advanced Directives 04/13/2016  Does patient have an advance directive? No  Would patient like information on creating an advanced directive? -     Allergies  Allergen Reactions  . Codeine Other (See Comments)    Other reaction(s): Headache Reaction:  Headaches   . Escitalopram Oxalate     Other reaction(s): Other (See Comments) Agitation  . Metronidazole Nausea Only  . Other     Other reaction(s): Unknown anti- Inflammatory and bee stings  . Pantoprazole Nausea Only  . Phenergan [Promethazine Hcl] Other (See Comments)    Reaction:  Unknown   . Sulfamethoxazole-Trimethoprim     Other reaction(s): Unknown    Chief Complaint  Patient presents with  . Discharge Note    HPI:  82 y.o. female is at the facility for rehab following a UT causing weakness and deconditioning. Pt progressed well with therapy. Feeling good, ready to go home. Goals of therapy met. Pt reports minimal pain- mostly related to a headache. Denies dyspnea.       Past Medical History  Diagnosis Date  . IDA (iron deficiency anemia)   . GERD (gastroesophageal reflux disease)   . Hyperlipidemia   . Hypertension   . Non-insulin dependent type 2 diabetes mellitus (HCC)   . Pancreatitis   . H/O: GI bleed   . Chronic diarrhea   . Chest pain syndrome   . Insomnia   . Chronic back pain     Past Surgical History  Procedure Laterality Date  . Cholecystectomy    . Appendectomy    . Hernia repair      x 2  . Exploratory laparotomy    . Knee arthroscopy Left   . Total abdominal  hysterectomy w/ bilateral salpingoophorectomy      one ovary still left      reports that she has never smoked. She has never used smokeless tobacco. She reports that she does not drink alcohol or use illicit drugs. Social History   Social History  . Marital Status: Widowed    Spouse Name: N/A  . Number of Children: N/A  . Years of Education: N/A   Occupational History  . Not on file.   Social History Main Topics  . Smoking status: Never Smoker   . Smokeless tobacco: Never Used  . Alcohol Use: No  . Drug Use: No  . Sexual Activity: Not on file   Other Topics Concern  . Not on file   Social History Narrative   Functional Status Survey:    Allergies  Allergen Reactions  . Codeine Other (See Comments)    Other reaction(s): Headache Reaction:  Headaches   . Escitalopram Oxalate     Other reaction(s): Other (See Comments) Agitation  . Metronidazole Nausea Only  . Other     Other reaction(s): Unknown anti- Inflammatory and bee stings  . Pantoprazole Nausea Only  . Phenergan [Promethazine Hcl] Other (See Comments)    Reaction:  Unknown   . Sulfamethoxazole-Trimethoprim     Other reaction(s): Unknown    Pertinent  Health Maintenance Due  Topic Date Due  . FOOT EXAM  12/10/1938  .   OPHTHALMOLOGY EXAM  12/10/1940  . DEXA SCAN  12/11/1995  . PNA vac Low Risk Adult (1 of 2 - PCV13) 12/11/1995  . HEMOGLOBIN A1C  07/26/2012  . INFLUENZA VACCINE  05/25/2016    Medications:   Medication List       This list is accurate as of: 05/05/16 10:04 PM.  Always use your most recent med list.               acetaminophen 500 MG tablet  Commonly known as:  TYLENOL  Take 500 mg by mouth 3 (three) times daily.     atorvastatin 20 MG tablet  Commonly known as:  LIPITOR  Take 20 mg by mouth at bedtime.     feeding supplement (GLUCERNA SHAKE) Liqd  Take 237 mLs by mouth 2 (two) times daily between meals.     insulin regular 100 units/mL injection  Commonly known as:   NOVOLIN R,HUMULIN R  Inject 2-10 Units into the skin 3 (three) times daily with meals as needed for high blood sugar. Pt uses as needed per sliding scale:    151-200:  2 units  201-250:  4 units 251-300:  6 units  301-350:  8 units 351-400:  10 units     irbesartan 150 MG tablet  Commonly known as:  AVAPRO  Take 150 mg by mouth daily.     memantine 5 MG tablet  Commonly known as:  NAMENDA  Take 5 mg by mouth 2 (two) times daily.     NOVOLIN 70/30 (70-30) 100 UNIT/ML injection  Generic drug:  insulin NPH-regular Human  Inject 3-10 Units into the skin 2 (two) times daily. Pt uses 10 units in the morning and 3 units in the evening.     omeprazole 40 MG capsule  Commonly known as:  PRILOSEC  Take 40 mg by mouth daily.     oxybutynin 5 MG 24 hr tablet  Commonly known as:  DITROPAN-XL  Take 5 mg by mouth at bedtime.     temazepam 7.5 MG capsule  Commonly known as:  RESTORIL  Take 7.5 mg by mouth at bedtime as needed for sleep.     traMADol 50 MG tablet  Commonly known as:  ULTRAM  Take 1 tablet (50 mg total) by mouth every 6 (six) hours as needed for moderate pain.        Review of Systems  Respiratory: Negative for cough, chest tightness and shortness of breath.   Cardiovascular: Negative for chest pain and leg swelling.  Genitourinary: Negative.   Musculoskeletal: Negative.   Skin: Negative.   Neurological: Positive for weakness and headaches. Negative for dizziness, syncope and numbness.  Psychiatric/Behavioral: Negative.   All other systems reviewed and are negative.   Filed Vitals:   05/05/16 2157  BP: 148/69  Pulse: 72  Temp: 98.4 F (36.9 C)  Resp: 20  SpO2: 94%   There is no weight on file to calculate BMI. Physical Exam  Constitutional: She is oriented to person, place, and time. She appears well-developed and well-nourished. No distress.  HENT:  Head: Normocephalic.  Eyes: Pupils are equal, round, and reactive to light.  Neck: Normal range of motion.  Neck supple. No JVD present.  Cardiovascular: Normal rate, regular rhythm, normal heart sounds and intact distal pulses.  Exam reveals no gallop and no friction rub.   No murmur heard. Pulmonary/Chest: Effort normal and breath sounds normal. No respiratory distress. She has no wheezes. She has no rales. She exhibits no  tenderness.  Abdominal: Soft. Bowel sounds are normal.  Musculoskeletal: Normal range of motion. She exhibits no edema or tenderness.  Lymphadenopathy:    She has no cervical adenopathy.  Neurological: She is alert and oriented to person, place, and time. Coordination (improved weakness and deconditioning) abnormal.  Skin: Skin is warm and dry. She is not diaphoretic. No pallor.  Psychiatric: She has a normal mood and affect. Her behavior is normal. Thought content normal.  Nursing note and vitals reviewed.   Labs reviewed: Basic Metabolic Panel:  Recent Labs  04/13/16 1324 04/14/16 0516 04/15/16 0521  NA 129* 132* 134*  K 4.9 4.2 4.1  CL 96* 101 102  CO2 25 27 26  GLUCOSE 177* 106* 102*  BUN 19 15 10  CREATININE 1.14* 0.99 0.69  CALCIUM 8.7* 7.9* 8.2*   Liver Function Tests: No results for input(s): AST, ALT, ALKPHOS, BILITOT, PROT, ALBUMIN in the last 8760 hours. No results for input(s): LIPASE, AMYLASE in the last 8760 hours. No results for input(s): AMMONIA in the last 8760 hours. CBC:  Recent Labs  12/29/15 1359 04/13/16 1324 04/14/16 0516 04/15/16 0521  WBC 12.6* 13.3* 13.7* 9.7  NEUTROABS 7.4*  --   --   --   HGB 11.4* 12.0 10.0* 10.8*  HCT 34.0* 36.7 30.2* 31.9*  MCV 85.1 86.2 85.7 86.4  PLT 434 426 340 346   Cardiac Enzymes: No results for input(s): CKTOTAL, CKMB, CKMBINDEX, TROPONINI in the last 8760 hours. BNP: Invalid input(s): POCBNP CBG:  Recent Labs  05/04/16 1642 05/04/16 1933 05/05/16 0708  GLUCAP 159* 152* 124*    Procedures and Imaging Studies During Stay: Ct Renal Stone Study  04/13/2016  CLINICAL DATA:  urine  culture results showed that patient needed to come to the hospital for IV antibiotics. Patient is complaining of back pain and dysuria. Patient states, "the pain is so bad I cry." EXAM: CT ABDOMEN AND PELVIS WITHOUT CONTRAST TECHNIQUE: Multidetector CT imaging of the abdomen and pelvis was performed following the standard protocol without IV contrast. COMPARISON:  11/27/2012 FINDINGS: Lower chest: Mitral annulus calcifications. No pleural or pericardial effusion. Visualized lung bases clear. Hepatobiliary: Previous cholecystectomy. Mild intra and extrahepatic biliary ductal dilatation, slightly more conspicuous than on prior study. No focal liver lesion on this unenhanced exam. Chronic focal calcification in segment 6. Pancreas: Mild diffuse parenchymal atrophy without focal lesion or ductal dilatation. Spleen: Within normal limits in size. Adrenals/Urinary Tract: 2.7 cm left lower pole lesion measures near fluid attenuation, present since scans dating back to 09/14/2011 with mild enlargement. No nephrolithiasis. No hydronephrosis. Thick-walled urinary bladder, partially distended. Some gas in the urinary bladder presumably from recent instrumentation. Stomach/Bowel: Small hiatal hernia. Stomach, small bowel, and colon are nondilated. A few small scattered distal descending and sigmoid diverticula without adjacent inflammatory/ edematous change. Fecal distention of the rectum. Vascular/Lymphatic: Heavy aortoiliac arterial calcifications without aneurysm. No adenopathy localized. Reproductive: Previous hysterectomy. Other: No ascites.  No free air. Musculoskeletal: Mild spondylitic changes in the lower thoracic and lumbar spine. No fracture. IMPRESSION: 1. Thickening of the wall of the urinary bladder suggesting cystitis. Intraluminal gas presumably secondary to recent instrumentation. 2. Fecal distention of the rectum without obstruction. 3. Descending and sigmoid diverticulosis. 4. Small hiatal hernia. 5. Left  lower pole renal lesion, possibly benign cyst but incompletely characterized. Electronically Signed   By: D  Hassell M.D.   On: 04/13/2016 17:29    Assessment/Plan:   1. UTI (lower urinary tract infection)  Resolved  Continue rehab   at home with home health PT/OT  F/U with PCP     Patient is being discharged with the following home health services:  Home Health PT/OT  Patient is being discharged with the following durable medical equipment: Pediatric Rolling walker   Patient has been advised to f/u with their PCP in 1-2 weeks to bring them up to date on their rehab stay.  Social services at facility was responsible for arranging this appointment.  Pt was provided with a 30 day supply of prescriptions for medications and refills must be obtained from their PCP.  For controlled substances, a more limited supply may be provided adequate until PCP appointment only.  Future labs/tests needed: PCP    H. , NP-C Geriatrics [facility] [address] [place], [place] [address] Cell Phone (Mon-Fri 8am-5pm):  [phone] On Call:  [phone] & follow prompts after 5pm & weekends Office Phone:  [phone] Office Fax:  [phone]   

## 2016-06-10 ENCOUNTER — Inpatient Hospital Stay: Payer: Medicare Other | Attending: Internal Medicine

## 2016-06-10 DIAGNOSIS — Z95828 Presence of other vascular implants and grafts: Secondary | ICD-10-CM

## 2016-06-10 DIAGNOSIS — Z452 Encounter for adjustment and management of vascular access device: Secondary | ICD-10-CM | POA: Diagnosis not present

## 2016-06-10 DIAGNOSIS — D649 Anemia, unspecified: Secondary | ICD-10-CM | POA: Insufficient documentation

## 2016-06-10 MED ORDER — SODIUM CHLORIDE 0.9% FLUSH
10.0000 mL | INTRAVENOUS | Status: DC | PRN
  Administered 2016-06-10: 10 mL via INTRAVENOUS
  Filled 2016-06-10: qty 10

## 2016-06-10 MED ORDER — HEPARIN SOD (PORK) LOCK FLUSH 100 UNIT/ML IV SOLN
500.0000 [IU] | Freq: Once | INTRAVENOUS | Status: AC
  Administered 2016-06-10: 500 [IU] via INTRAVENOUS

## 2016-06-23 ENCOUNTER — Inpatient Hospital Stay: Payer: Medicare Other

## 2016-06-24 ENCOUNTER — Inpatient Hospital Stay: Payer: Medicare Other

## 2016-06-24 DIAGNOSIS — D649 Anemia, unspecified: Secondary | ICD-10-CM | POA: Diagnosis not present

## 2016-06-24 DIAGNOSIS — D509 Iron deficiency anemia, unspecified: Secondary | ICD-10-CM

## 2016-06-24 LAB — CBC WITH DIFFERENTIAL/PLATELET
BASOS ABS: 0.1 10*3/uL (ref 0–0.1)
Basophils Relative: 0 %
EOS ABS: 0.3 10*3/uL (ref 0–0.7)
EOS PCT: 3 %
HCT: 35.4 % (ref 35.0–47.0)
Hemoglobin: 11.9 g/dL — ABNORMAL LOW (ref 12.0–16.0)
Lymphocytes Relative: 38 %
Lymphs Abs: 5.1 10*3/uL — ABNORMAL HIGH (ref 1.0–3.6)
MCH: 28.7 pg (ref 26.0–34.0)
MCHC: 33.6 g/dL (ref 32.0–36.0)
MCV: 85.5 fL (ref 80.0–100.0)
Monocytes Absolute: 1.1 10*3/uL — ABNORMAL HIGH (ref 0.2–0.9)
Monocytes Relative: 8 %
Neutro Abs: 6.9 10*3/uL — ABNORMAL HIGH (ref 1.4–6.5)
Neutrophils Relative %: 51 %
PLATELETS: 448 10*3/uL — AB (ref 150–440)
RBC: 4.14 MIL/uL (ref 3.80–5.20)
RDW: 14.5 % (ref 11.5–14.5)
WBC: 13.6 10*3/uL — AB (ref 3.6–11.0)

## 2016-06-24 LAB — IRON AND TIBC
IRON: 44 ug/dL (ref 28–170)
SATURATION RATIOS: 19 % (ref 10.4–31.8)
TIBC: 234 ug/dL — ABNORMAL LOW (ref 250–450)
UIBC: 190 ug/dL

## 2016-06-24 LAB — COMPREHENSIVE METABOLIC PANEL
ALT: 8 U/L — AB (ref 14–54)
AST: 19 U/L (ref 15–41)
Albumin: 3.4 g/dL — ABNORMAL LOW (ref 3.5–5.0)
Alkaline Phosphatase: 58 U/L (ref 38–126)
Anion gap: 11 (ref 5–15)
BUN: 23 mg/dL — ABNORMAL HIGH (ref 6–20)
CHLORIDE: 94 mmol/L — AB (ref 101–111)
CO2: 25 mmol/L (ref 22–32)
CREATININE: 1.01 mg/dL — AB (ref 0.44–1.00)
Calcium: 8.5 mg/dL — ABNORMAL LOW (ref 8.9–10.3)
GFR calc non Af Amer: 49 mL/min — ABNORMAL LOW (ref >60)
GFR, EST AFRICAN AMERICAN: 57 mL/min — AB (ref >60)
Glucose, Bld: 132 mg/dL — ABNORMAL HIGH (ref 65–99)
POTASSIUM: 4.7 mmol/L (ref 3.5–5.1)
SODIUM: 130 mmol/L — AB (ref 135–145)
Total Bilirubin: 0.4 mg/dL (ref 0.3–1.2)
Total Protein: 7.5 g/dL (ref 6.5–8.1)

## 2016-06-24 LAB — FERRITIN: Ferritin: 318 ng/mL — ABNORMAL HIGH (ref 11–307)

## 2016-06-30 ENCOUNTER — Encounter: Payer: Self-pay | Admitting: Internal Medicine

## 2016-06-30 ENCOUNTER — Inpatient Hospital Stay: Payer: Medicare Other | Attending: Internal Medicine | Admitting: Internal Medicine

## 2016-06-30 ENCOUNTER — Inpatient Hospital Stay: Payer: Medicare Other

## 2016-06-30 VITALS — BP 122/74 | HR 84 | Temp 97.2°F | Resp 17 | Ht <= 58 in | Wt 139.9 lb

## 2016-06-30 DIAGNOSIS — E785 Hyperlipidemia, unspecified: Secondary | ICD-10-CM | POA: Insufficient documentation

## 2016-06-30 DIAGNOSIS — D72829 Elevated white blood cell count, unspecified: Secondary | ICD-10-CM | POA: Diagnosis not present

## 2016-06-30 DIAGNOSIS — D508 Other iron deficiency anemias: Secondary | ICD-10-CM

## 2016-06-30 DIAGNOSIS — M549 Dorsalgia, unspecified: Secondary | ICD-10-CM | POA: Insufficient documentation

## 2016-06-30 DIAGNOSIS — I10 Essential (primary) hypertension: Secondary | ICD-10-CM | POA: Diagnosis not present

## 2016-06-30 DIAGNOSIS — K219 Gastro-esophageal reflux disease without esophagitis: Secondary | ICD-10-CM | POA: Diagnosis not present

## 2016-06-30 DIAGNOSIS — E119 Type 2 diabetes mellitus without complications: Secondary | ICD-10-CM | POA: Insufficient documentation

## 2016-06-30 DIAGNOSIS — K529 Noninfective gastroenteritis and colitis, unspecified: Secondary | ICD-10-CM | POA: Diagnosis not present

## 2016-06-30 DIAGNOSIS — D509 Iron deficiency anemia, unspecified: Secondary | ICD-10-CM | POA: Insufficient documentation

## 2016-06-30 DIAGNOSIS — D649 Anemia, unspecified: Secondary | ICD-10-CM | POA: Diagnosis not present

## 2016-06-30 DIAGNOSIS — R071 Chest pain on breathing: Secondary | ICD-10-CM | POA: Insufficient documentation

## 2016-06-30 DIAGNOSIS — G8929 Other chronic pain: Secondary | ICD-10-CM | POA: Diagnosis not present

## 2016-06-30 DIAGNOSIS — Z794 Long term (current) use of insulin: Secondary | ICD-10-CM | POA: Diagnosis not present

## 2016-06-30 DIAGNOSIS — Z79899 Other long term (current) drug therapy: Secondary | ICD-10-CM | POA: Insufficient documentation

## 2016-06-30 DIAGNOSIS — D696 Thrombocytopenia, unspecified: Secondary | ICD-10-CM | POA: Diagnosis not present

## 2016-06-30 NOTE — Progress Notes (Signed)
[place] OFFICE PROGRESS NOTE  Patient Care Team: Glendon Axe, MD as PCP - General (Internal Medicine)   SUMMARY OF ONCOLOGIC HISTORY:  # CHRONIC IRON DEFICIENCY ANEMIA- ? 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   INTERVAL HISTORY: hard of hearing/poor historian. I 82 year old female patient with above history of anemia likely secondary to iron deficiency with intermittent IV iron infusion is here for follow-up. Patient's last IV iron has been at least more than a year. Patient's intolerant of by mouth iron.  Patient denies any unusual shortness of breath or chest pain. No cough. Patient has chronic arthritis; she uses wheelchair.   REVIEW OF SYSTEMS:  A complete 10 point review of system is done which is negative except mentioned above/history of present illness.   PAST MEDICAL HISTORY :  Past Medical History:  Diagnosis Date  . Chest pain syndrome   . Chronic back pain   . Chronic diarrhea   . GERD (gastroesophageal reflux disease)   . H/O: GI bleed   . Hyperlipidemia   . Hypertension   . IDA (iron deficiency anemia)   . Insomnia   . Non-insulin dependent type 2 diabetes mellitus ([place])   . Pancreatitis     PAST SURGICAL HISTORY :   Past Surgical History:  Procedure Laterality Date  . APPENDECTOMY    . CHOLECYSTECTOMY    . EXPLORATORY LAPAROTOMY    . HERNIA REPAIR     x 2  . KNEE ARTHROSCOPY Left   . TOTAL ABDOMINAL HYSTERECTOMY W/ BILATERAL SALPINGOOPHORECTOMY     one ovary still left    FAMILY HISTORY :  History reviewed. No pertinent family history.  SOCIAL HISTORY:   Social History  Substance Use Topics  . Smoking status: Never Smoker  . Smokeless tobacco: Never Used  . Alcohol use No    ALLERGIES:  is allergic to codeine; escitalopram oxalate; metronidazole; other; pantoprazole; phenergan [promethazine hcl]; and  sulfamethoxazole-trimethoprim.  MEDICATIONS:  Current Outpatient Prescriptions  Medication Sig Dispense Refill  . acetaminophen (TYLENOL) 500 MG tablet Take 500 mg by mouth 3 (three) times daily.     Marland Kitchen atorvastatin (LIPITOR) 20 MG tablet Take 20 mg by mouth at bedtime.     . insulin NPH-regular Human (NOVOLIN 70/30) (70-30) 100 UNIT/ML injection Inject 3-10 Units into the skin 2 (two) times daily. Pt uses 10 units in the morning and 3 units in the evening.    . insulin regular (NOVOLIN R,HUMULIN R) 100 units/mL injection Inject 2-10 Units into the skin 3 (three) times daily with meals as needed for high blood sugar. Pt uses as needed per sliding scale:    151-200:  2 units  201-250:  4 units 251-300:  6 units  301-350:  8 units 351-400:  10 units    . irbesartan (AVAPRO) 150 MG tablet Take 150 mg by mouth daily.    Marland Kitchen lidocaine-prilocaine (EMLA) cream     . memantine (NAMENDA) 5 MG tablet Take 5 mg by mouth 2 (two) times daily.     Marland Kitchen omeprazole (PRILOSEC) 40 MG capsule Take 40 mg by mouth daily.     Marland Kitchen oxybutynin (DITROPAN-XL) 5 MG 24 hr tablet Take 5 mg by mouth at bedtime.     . temazepam (RESTORIL) 7.5 MG capsule Take 7.5 mg by mouth at bedtime as needed for sleep.     . traMADol (ULTRAM) 50 MG tablet Take 1 tablet (50 mg total) by mouth  every 6 (six) hours as needed for moderate pain. 30 tablet 0   No current facility-administered medications for this visit.    Facility-Administered Medications Ordered in Other Visits  Medication Dose Route Frequency Provider Last Rate Last Dose  . sodium chloride 0.9 % injection 10 mL  10 mL Intravenous PRN Leia Alf, MD   10 mL at 03/03/15 1528    PHYSICAL EXAMINATION:   BP 122/74 (BP Location: Left Arm, Patient Position: Sitting)   Pulse 84   Temp 97.2 F (36.2 C) (Tympanic)   Resp 17   Ht 4\' 9"  (1.448 m)   Wt 139 lb 14.4 oz (63.5 kg)   BMI 30.27 kg/m   Filed Weights   06/30/16 1417  Weight: 139 lb 14.4 oz (63.5 kg)     GENERAL: Well-nourished well-developed; Alert, no distress and comfortable.  She is in a wheel chair. Accompanied by brother; hard of hearing.  EYES: no pallor or icterus OROPHARYNX: no thrush or ulceration; NECK: supple, no masses felt LYMPH:  no palpable lymphadenopathy in the cervical, axillary or inguinal regions LUNGS: clear to auscultation and  No wheeze or crackles HEART/CVS: regular rate & rhythm and no murmurs; No lower extremity edema ABDOMEN:abdomen soft, non-tender and normal bowel sounds Musculoskeletal:no cyanosis of digits and no clubbing  PSYCH: alert & oriented x 3 with fluent speech NEURO: no focal motor/sensory deficits SKIN:  no rashes or significant lesions  LABORATORY DATA:  I have reviewed the data as listed    Component Value Date/Time   NA 130 (L) 06/24/2016 1412   NA 134 (L) 05/20/2014 0533   K 4.7 06/24/2016 1412   K 3.5 05/20/2014 0533   CL 94 (L) 06/24/2016 1412   CL 102 05/20/2014 0533   CO2 25 06/24/2016 1412   CO2 23 05/20/2014 0533   GLUCOSE 132 (H) 06/24/2016 1412   GLUCOSE 90 05/20/2014 0533   BUN 23 (H) 06/24/2016 1412   BUN 23 (H) 05/20/2014 0533   CREATININE 1.01 (H) 06/24/2016 1412   CREATININE 1.12 05/20/2014 0533   CALCIUM 8.5 (L) 06/24/2016 1412   CALCIUM 8.0 (L) 05/20/2014 0533   PROT 7.5 06/24/2016 1412   PROT 6.7 05/17/2014 1852   PROT 6.7 05/17/2014 1852   ALBUMIN 3.4 (L) 06/24/2016 1412   ALBUMIN 2.2 (L) 05/17/2014 1852   ALBUMIN 2.2 (L) 05/17/2014 1852   AST 19 06/24/2016 1412   AST 117 (H) 05/17/2014 1852   AST 116 (H) 05/17/2014 1852   ALT 8 (L) 06/24/2016 1412   ALT 29 05/17/2014 1852   ALT 29 05/17/2014 1852   ALKPHOS 58 06/24/2016 1412   ALKPHOS 64 05/17/2014 1852   ALKPHOS 65 05/17/2014 1852   BILITOT 0.4 06/24/2016 1412   BILITOT 0.3 05/17/2014 1852   BILITOT 0.3 05/17/2014 1852   GFRNONAA 49 (L) 06/24/2016 1412   GFRNONAA 45 (L) 05/20/2014 0533   GFRAA 57 (L) 06/24/2016 1412   GFRAA 53 (L) 05/20/2014  0533    No results found for: SPEP, UPEP  Lab Results  Component Value Date   WBC 13.6 (H) 06/24/2016   NEUTROABS 6.9 (H) 06/24/2016   HGB 11.9 (L) 06/24/2016   HCT 35.4 06/24/2016   MCV 85.5 06/24/2016   PLT 448 (H) 06/24/2016      Chemistry      Component Value Date/Time   NA 130 (L) 06/24/2016 1412   NA 134 (L) 05/20/2014 0533   K 4.7 06/24/2016 1412   K 3.5 05/20/2014 0533  CL 94 (L) 06/24/2016 1412   CL 102 05/20/2014 0533   CO2 25 06/24/2016 1412   CO2 23 05/20/2014 0533   BUN 23 (H) 06/24/2016 1412   BUN 23 (H) 05/20/2014 0533   CREATININE 1.01 (H) 06/24/2016 1412   CREATININE 1.12 05/20/2014 0533      Component Value Date/Time   CALCIUM 8.5 (L) 06/24/2016 1412   CALCIUM 8.0 (L) 05/20/2014 0533   ALKPHOS 58 06/24/2016 1412   ALKPHOS 64 05/17/2014 1852   ALKPHOS 65 05/17/2014 1852   AST 19 06/24/2016 1412   AST 117 (H) 05/17/2014 1852   AST 116 (H) 05/17/2014 1852   ALT 8 (L) 06/24/2016 1412   ALT 29 05/17/2014 1852   ALT 29 05/17/2014 1852   BILITOT 0.4 06/24/2016 1412   BILITOT 0.3 05/17/2014 1852   BILITOT 0.3 05/17/2014 1852       ASSESSMENT & PLAN:  Other iron deficiency anemias # Anemia normocytic- hemoglobin 11.9  today. Iron studies- not suggestive for IDA. No plan for Bone marrow at this time.  # discussed with brother; # I would recommend a follow-up in 6 months; continue port flushes every 6-8 weeks.      Cammie Sickle, MD 06/30/2016 3:13 PM

## 2016-06-30 NOTE — Assessment & Plan Note (Signed)
#   Anemia normocytic- hemoglobin 11.9  today. Iron studies- not suggestive for IDA. No plan for Bone marrow at this time.  # discussed with brother; # I would recommend a follow-up in 6 months; continue port flushes every 6-8 weeks.

## 2016-06-30 NOTE — Progress Notes (Signed)
No concerns noted

## 2016-08-25 ENCOUNTER — Inpatient Hospital Stay: Payer: Medicare Other | Attending: Internal Medicine

## 2016-08-25 DIAGNOSIS — Z95828 Presence of other vascular implants and grafts: Secondary | ICD-10-CM

## 2016-08-25 DIAGNOSIS — Z452 Encounter for adjustment and management of vascular access device: Secondary | ICD-10-CM | POA: Diagnosis not present

## 2016-08-25 DIAGNOSIS — D509 Iron deficiency anemia, unspecified: Secondary | ICD-10-CM | POA: Diagnosis present

## 2016-08-25 MED ORDER — HEPARIN SOD (PORK) LOCK FLUSH 100 UNIT/ML IV SOLN
500.0000 [IU] | Freq: Once | INTRAVENOUS | Status: AC
  Administered 2016-08-25: 500 [IU] via INTRAVENOUS

## 2016-08-25 MED ORDER — SODIUM CHLORIDE 0.9% FLUSH
10.0000 mL | INTRAVENOUS | Status: DC | PRN
  Administered 2016-08-25: 10 mL via INTRAVENOUS
  Filled 2016-08-25: qty 10

## 2016-10-20 ENCOUNTER — Inpatient Hospital Stay: Payer: Medicare Other

## 2016-10-27 ENCOUNTER — Inpatient Hospital Stay: Payer: Medicare Other | Attending: Internal Medicine

## 2016-10-27 DIAGNOSIS — Z452 Encounter for adjustment and management of vascular access device: Secondary | ICD-10-CM | POA: Diagnosis not present

## 2016-10-27 DIAGNOSIS — Z95828 Presence of other vascular implants and grafts: Secondary | ICD-10-CM

## 2016-10-27 DIAGNOSIS — D509 Iron deficiency anemia, unspecified: Secondary | ICD-10-CM | POA: Insufficient documentation

## 2016-10-27 MED ORDER — SODIUM CHLORIDE 0.9% FLUSH
10.0000 mL | INTRAVENOUS | Status: DC | PRN
  Administered 2016-10-27: 10 mL via INTRAVENOUS
  Filled 2016-10-27: qty 10

## 2016-10-27 MED ORDER — HEPARIN SOD (PORK) LOCK FLUSH 100 UNIT/ML IV SOLN
500.0000 [IU] | Freq: Once | INTRAVENOUS | Status: AC
  Administered 2016-10-27: 500 [IU] via INTRAVENOUS

## 2016-12-15 ENCOUNTER — Inpatient Hospital Stay: Payer: Medicare Other

## 2016-12-17 ENCOUNTER — Inpatient Hospital Stay: Payer: Medicare Other | Attending: Internal Medicine

## 2016-12-17 DIAGNOSIS — D509 Iron deficiency anemia, unspecified: Secondary | ICD-10-CM | POA: Diagnosis not present

## 2016-12-17 DIAGNOSIS — D508 Other iron deficiency anemias: Secondary | ICD-10-CM

## 2016-12-17 DIAGNOSIS — Z95828 Presence of other vascular implants and grafts: Secondary | ICD-10-CM

## 2016-12-17 LAB — CBC WITH DIFFERENTIAL/PLATELET
BASOS ABS: 0.1 10*3/uL (ref 0–0.1)
Basophils Relative: 1 %
Eosinophils Absolute: 0.3 10*3/uL (ref 0–0.7)
Eosinophils Relative: 2 %
HEMATOCRIT: 32.6 % — AB (ref 35.0–47.0)
Hemoglobin: 11.1 g/dL — ABNORMAL LOW (ref 12.0–16.0)
LYMPHS ABS: 4.2 10*3/uL — AB (ref 1.0–3.6)
LYMPHS PCT: 29 %
MCH: 29 pg (ref 26.0–34.0)
MCHC: 33.9 g/dL (ref 32.0–36.0)
MCV: 85.5 fL (ref 80.0–100.0)
MONO ABS: 1 10*3/uL — AB (ref 0.2–0.9)
MONOS PCT: 7 %
NEUTROS ABS: 9.1 10*3/uL — AB (ref 1.4–6.5)
Neutrophils Relative %: 61 %
Platelets: 397 10*3/uL (ref 150–440)
RBC: 3.82 MIL/uL (ref 3.80–5.20)
RDW: 14 % (ref 11.5–14.5)
WBC: 14.7 10*3/uL — ABNORMAL HIGH (ref 3.6–11.0)

## 2016-12-17 LAB — COMPREHENSIVE METABOLIC PANEL
ALT: 7 U/L — ABNORMAL LOW (ref 14–54)
AST: 20 U/L (ref 15–41)
Albumin: 3.6 g/dL (ref 3.5–5.0)
Alkaline Phosphatase: 84 U/L (ref 38–126)
Anion gap: 9 (ref 5–15)
BILIRUBIN TOTAL: 0.4 mg/dL (ref 0.3–1.2)
BUN: 23 mg/dL — ABNORMAL HIGH (ref 6–20)
CO2: 23 mmol/L (ref 22–32)
CREATININE: 1.06 mg/dL — AB (ref 0.44–1.00)
Calcium: 8.7 mg/dL — ABNORMAL LOW (ref 8.9–10.3)
Chloride: 93 mmol/L — ABNORMAL LOW (ref 101–111)
GFR, EST AFRICAN AMERICAN: 54 mL/min — AB (ref >60)
GFR, EST NON AFRICAN AMERICAN: 46 mL/min — AB (ref >60)
Glucose, Bld: 184 mg/dL — ABNORMAL HIGH (ref 65–99)
POTASSIUM: 4.5 mmol/L (ref 3.5–5.1)
Sodium: 125 mmol/L — ABNORMAL LOW (ref 135–145)
TOTAL PROTEIN: 7.5 g/dL (ref 6.5–8.1)

## 2016-12-17 LAB — FERRITIN: Ferritin: 331 ng/mL — ABNORMAL HIGH (ref 11–307)

## 2016-12-17 LAB — IRON AND TIBC
Iron: 41 ug/dL (ref 28–170)
SATURATION RATIOS: 17 % (ref 10.4–31.8)
TIBC: 247 ug/dL — ABNORMAL LOW (ref 250–450)
UIBC: 206 ug/dL

## 2016-12-17 MED ORDER — HEPARIN SOD (PORK) LOCK FLUSH 100 UNIT/ML IV SOLN
500.0000 [IU] | Freq: Once | INTRAVENOUS | Status: AC
  Administered 2016-12-17: 500 [IU] via INTRAVENOUS

## 2016-12-17 MED ORDER — SODIUM CHLORIDE 0.9% FLUSH
10.0000 mL | INTRAVENOUS | Status: DC | PRN
  Administered 2016-12-17: 10 mL via INTRAVENOUS
  Filled 2016-12-17: qty 10

## 2016-12-21 ENCOUNTER — Other Ambulatory Visit: Payer: PRIVATE HEALTH INSURANCE

## 2016-12-22 ENCOUNTER — Inpatient Hospital Stay: Payer: Medicare Other

## 2016-12-29 ENCOUNTER — Other Ambulatory Visit: Payer: PRIVATE HEALTH INSURANCE

## 2016-12-29 ENCOUNTER — Ambulatory Visit: Payer: PRIVATE HEALTH INSURANCE | Admitting: Internal Medicine

## 2017-01-07 ENCOUNTER — Ambulatory Visit: Payer: PRIVATE HEALTH INSURANCE

## 2017-01-07 ENCOUNTER — Ambulatory Visit: Payer: PRIVATE HEALTH INSURANCE | Admitting: Internal Medicine

## 2017-01-17 ENCOUNTER — Inpatient Hospital Stay: Payer: Medicare Other | Attending: Internal Medicine | Admitting: Internal Medicine

## 2017-01-17 ENCOUNTER — Inpatient Hospital Stay: Payer: Medicare Other

## 2017-01-17 VITALS — BP 145/80 | HR 93 | Temp 97.7°F | Resp 18 | Ht <= 58 in | Wt 150.0 lb

## 2017-01-17 DIAGNOSIS — G8929 Other chronic pain: Secondary | ICD-10-CM | POA: Diagnosis not present

## 2017-01-17 DIAGNOSIS — D72829 Elevated white blood cell count, unspecified: Secondary | ICD-10-CM | POA: Insufficient documentation

## 2017-01-17 DIAGNOSIS — R079 Chest pain, unspecified: Secondary | ICD-10-CM | POA: Insufficient documentation

## 2017-01-17 DIAGNOSIS — E785 Hyperlipidemia, unspecified: Secondary | ICD-10-CM | POA: Insufficient documentation

## 2017-01-17 DIAGNOSIS — Z8719 Personal history of other diseases of the digestive system: Secondary | ICD-10-CM | POA: Diagnosis not present

## 2017-01-17 DIAGNOSIS — D500 Iron deficiency anemia secondary to blood loss (chronic): Secondary | ICD-10-CM

## 2017-01-17 DIAGNOSIS — Z9049 Acquired absence of other specified parts of digestive tract: Secondary | ICD-10-CM | POA: Insufficient documentation

## 2017-01-17 DIAGNOSIS — M549 Dorsalgia, unspecified: Secondary | ICD-10-CM | POA: Diagnosis not present

## 2017-01-17 DIAGNOSIS — E119 Type 2 diabetes mellitus without complications: Secondary | ICD-10-CM | POA: Diagnosis not present

## 2017-01-17 DIAGNOSIS — G4700 Insomnia, unspecified: Secondary | ICD-10-CM | POA: Insufficient documentation

## 2017-01-17 DIAGNOSIS — I10 Essential (primary) hypertension: Secondary | ICD-10-CM | POA: Diagnosis not present

## 2017-01-17 DIAGNOSIS — D509 Iron deficiency anemia, unspecified: Secondary | ICD-10-CM | POA: Diagnosis not present

## 2017-01-17 DIAGNOSIS — Z794 Long term (current) use of insulin: Secondary | ICD-10-CM | POA: Insufficient documentation

## 2017-01-17 DIAGNOSIS — K219 Gastro-esophageal reflux disease without esophagitis: Secondary | ICD-10-CM

## 2017-01-17 DIAGNOSIS — Z79899 Other long term (current) drug therapy: Secondary | ICD-10-CM | POA: Insufficient documentation

## 2017-01-17 NOTE — Progress Notes (Signed)
[place] OFFICE PROGRESS NOTE  Patient Care Team: Glendon Axe, MD as PCP - General (Internal Medicine)   SUMMARY OF ONCOLOGIC HISTORY:  # CHRONIC IRON DEFICIENCY ANEMIA- ? 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   INTERVAL HISTORY: hard of hearing/poor historian.   83 -year-old female patient with above history of anemia  [? iron deficiency with intermittent IV iron infusion]  is here for follow-up. Patient's intolerant of by mouth iron.  Patient denies any unusual shortness of breath or chest pain. No cough. Patient has chronic arthritis; she uses wheelchair. She denies any blood in stools or black stools. No fever no chills. No weight loss. No early satiety.  REVIEW OF SYSTEMS:  A complete 10 point review of system is done which is negative except mentioned above/history of present illness.   PAST MEDICAL HISTORY :  Past Medical History:  Diagnosis Date  . Chest pain syndrome   . Chronic back pain   . Chronic diarrhea   . GERD (gastroesophageal reflux disease)   . H/O: GI bleed   . Hyperlipidemia   . Hypertension   . IDA (iron deficiency anemia)   . Insomnia   . Non-insulin dependent type 2 diabetes mellitus ([place])   . Pancreatitis     PAST SURGICAL HISTORY :   Past Surgical History:  Procedure Laterality Date  . APPENDECTOMY    . CHOLECYSTECTOMY    . EXPLORATORY LAPAROTOMY    . HERNIA REPAIR     x 2  . KNEE ARTHROSCOPY Left   . TOTAL ABDOMINAL HYSTERECTOMY W/ BILATERAL SALPINGOOPHORECTOMY     one ovary still left    FAMILY HISTORY :  No family history on file.  SOCIAL HISTORY:   Social History  Substance Use Topics  . Smoking status: Never Smoker  . Smokeless tobacco: Never Used  . Alcohol use No    ALLERGIES:  is allergic to codeine; escitalopram oxalate; metronidazole; other; pantoprazole; phenergan [promethazine hcl]; and  sulfamethoxazole-trimethoprim.  MEDICATIONS:  Current Outpatient Prescriptions  Medication Sig Dispense Refill  . acetaminophen (TYLENOL) 500 MG tablet Take 500 mg by mouth 2 (two) times daily.     Marland Kitchen atorvastatin (LIPITOR) 20 MG tablet Take 20 mg by mouth at bedtime.     . insulin NPH-regular Human (NOVOLIN 70/30) (70-30) 100 UNIT/ML injection Inject 3-10 Units into the skin 2 (two) times daily. Pt uses 15 units in the morning. and 3 units in the evening.    . insulin regular (NOVOLIN R,HUMULIN R) 100 units/mL injection Inject 2-10 Units into the skin 3 (three) times daily with meals as needed for high blood sugar. Pt uses as needed per sliding scale:    151-200:  2 units  201-250:  4 units 251-300:  6 units  301-350:  8 units 351-400:  10 units    . irbesartan (AVAPRO) 150 MG tablet Take 150 mg by mouth daily.    Marland Kitchen lidocaine-prilocaine (EMLA) cream Apply 1 application topically once. Port a cath    . memantine (NAMENDA) 5 MG tablet Take 5 mg by mouth 2 (two) times daily.     Marland Kitchen omeprazole (PRILOSEC) 40 MG capsule Take 40 mg by mouth daily.     Marland Kitchen oxybutynin (DITROPAN-XL) 5 MG 24 hr tablet Take 5 mg by mouth 2 (two) times daily.     . traMADol (ULTRAM) 50 MG tablet Take 1 tablet (50 mg total) by mouth every 6 (six) hours as  needed for moderate pain. 30 tablet 0   No current facility-administered medications for this visit.    Facility-Administered Medications Ordered in Other Visits  Medication Dose Route Frequency Provider Last Rate Last Dose  . sodium chloride 0.9 % injection 10 mL  10 mL Intravenous PRN Leia Alf, MD   10 mL at 03/03/15 1528    PHYSICAL EXAMINATION:   BP (!) 145/80 (BP Location: Left Arm, Patient Position: Sitting)   Pulse 93   Temp 97.7 F (36.5 C) (Tympanic)   Resp 18   Ht 4\' 9"  (1.448 m)   Wt 150 lb (68 kg)   BMI 32.46 kg/m   Filed Weights   01/17/17 1451  Weight: 150 lb (68 kg)    GENERAL: Well-nourished well-developed; Alert, no distress and  comfortable.  She is in a wheel chair. Accompanied by brother; hard of hearing.  EYES: no pallor or icterus OROPHARYNX: no thrush or ulceration; NECK: supple, no masses felt LYMPH:  no palpable lymphadenopathy in the cervical, axillary or inguinal regions LUNGS: clear to auscultation and  No wheeze or crackles HEART/CVS: regular rate & rhythm and no murmurs; No lower extremity edema ABDOMEN:abdomen soft, non-tender and normal bowel sounds Musculoskeletal:no cyanosis of digits and no clubbing  PSYCH: alert & oriented x 3 with fluent speech NEURO: no focal motor/sensory deficits SKIN:  no rashes or significant lesions  LABORATORY DATA:  I have reviewed the data as listed    Component Value Date/Time   NA 125 (L) 12/17/2016 1357   NA 134 (L) 05/20/2014 0533   K 4.5 12/17/2016 1357   K 3.5 05/20/2014 0533   CL 93 (L) 12/17/2016 1357   CL 102 05/20/2014 0533   CO2 23 12/17/2016 1357   CO2 23 05/20/2014 0533   GLUCOSE 184 (H) 12/17/2016 1357   GLUCOSE 90 05/20/2014 0533   BUN 23 (H) 12/17/2016 1357   BUN 23 (H) 05/20/2014 0533   CREATININE 1.06 (H) 12/17/2016 1357   CREATININE 1.12 05/20/2014 0533   CALCIUM 8.7 (L) 12/17/2016 1357   CALCIUM 8.0 (L) 05/20/2014 0533   PROT 7.5 12/17/2016 1357   PROT 6.7 05/17/2014 1852   PROT 6.7 05/17/2014 1852   ALBUMIN 3.6 12/17/2016 1357   ALBUMIN 2.2 (L) 05/17/2014 1852   ALBUMIN 2.2 (L) 05/17/2014 1852   AST 20 12/17/2016 1357   AST 117 (H) 05/17/2014 1852   AST 116 (H) 05/17/2014 1852   ALT 7 (L) 12/17/2016 1357   ALT 29 05/17/2014 1852   ALT 29 05/17/2014 1852   ALKPHOS 84 12/17/2016 1357   ALKPHOS 64 05/17/2014 1852   ALKPHOS 65 05/17/2014 1852   BILITOT 0.4 12/17/2016 1357   BILITOT 0.3 05/17/2014 1852   BILITOT 0.3 05/17/2014 1852   GFRNONAA 46 (L) 12/17/2016 1357   GFRNONAA 45 (L) 05/20/2014 0533   GFRAA 54 (L) 12/17/2016 1357   GFRAA 53 (L) 05/20/2014 0533    No results found for: SPEP, UPEP  Lab Results  Component  Value Date   WBC 14.7 (H) 12/17/2016   NEUTROABS 9.1 (H) 12/17/2016   HGB 11.1 (L) 12/17/2016   HCT 32.6 (L) 12/17/2016   MCV 85.5 12/17/2016   PLT 397 12/17/2016      Chemistry      Component Value Date/Time   NA 125 (L) 12/17/2016 1357   NA 134 (L) 05/20/2014 0533   K 4.5 12/17/2016 1357   K 3.5 05/20/2014 0533   CL 93 (L) 12/17/2016 1357   CL  102 05/20/2014 0533   CO2 23 12/17/2016 1357   CO2 23 05/20/2014 0533   BUN 23 (H) 12/17/2016 1357   BUN 23 (H) 05/20/2014 0533   CREATININE 1.06 (H) 12/17/2016 1357   CREATININE 1.12 05/20/2014 0533      Component Value Date/Time   CALCIUM 8.7 (L) 12/17/2016 1357   CALCIUM 8.0 (L) 05/20/2014 0533   ALKPHOS 84 12/17/2016 1357   ALKPHOS 64 05/17/2014 1852   ALKPHOS 65 05/17/2014 1852   AST 20 12/17/2016 1357   AST 117 (H) 05/17/2014 1852   AST 116 (H) 05/17/2014 1852   ALT 7 (L) 12/17/2016 1357   ALT 29 05/17/2014 1852   ALT 29 05/17/2014 1852   BILITOT 0.4 12/17/2016 1357   BILITOT 0.3 05/17/2014 1852   BILITOT 0.3 05/17/2014 1852       ASSESSMENT & PLAN:  Iron deficiency anemia due to chronic blood loss # Anemia normocytic- hemoglobin 11.3  today. Iron studies- not suggestive for IDA; hemoglobin holding steady for the last many months. No plan for iron.  # Discussed with the patient's son. Recommend follow-up in 1 year/labs prior.      Cammie Sickle, MD 01/21/2017 6:13 PM

## 2017-01-17 NOTE — Assessment & Plan Note (Addendum)
#   Anemia normocytic- hemoglobin 11.3  today. Iron studies- not suggestive for IDA; hemoglobin holding steady for the last many months. No plan for iron.  # Discussed with the patient's son. Recommend follow-up in 1 year/labs prior.

## 2017-01-17 NOTE — Progress Notes (Signed)
Patient here for possible IV iron today. Labs and port flush performed on 01/14/17. Patient states that she is unable to tolerate swallowing avapro and requested that Dr. Rogue Bussing prescribe a different drug. I explained to patient and her son that they would need to consult with her pcp-Dr. Wallace Keller to change her bp medications.

## 2017-03-14 ENCOUNTER — Inpatient Hospital Stay: Payer: Medicare Other | Attending: Internal Medicine

## 2017-03-14 DIAGNOSIS — Z452 Encounter for adjustment and management of vascular access device: Secondary | ICD-10-CM | POA: Insufficient documentation

## 2017-03-14 DIAGNOSIS — D500 Iron deficiency anemia secondary to blood loss (chronic): Secondary | ICD-10-CM | POA: Insufficient documentation

## 2017-03-14 DIAGNOSIS — Z95828 Presence of other vascular implants and grafts: Secondary | ICD-10-CM

## 2017-03-14 MED ORDER — HEPARIN SOD (PORK) LOCK FLUSH 100 UNIT/ML IV SOLN
500.0000 [IU] | Freq: Once | INTRAVENOUS | Status: AC
  Administered 2017-03-14: 500 [IU] via INTRAVENOUS

## 2017-03-14 MED ORDER — SODIUM CHLORIDE 0.9% FLUSH
10.0000 mL | INTRAVENOUS | Status: DC | PRN
  Administered 2017-03-14: 10 mL via INTRAVENOUS
  Filled 2017-03-14: qty 10

## 2017-04-30 ENCOUNTER — Emergency Department: Payer: Medicare Other

## 2017-04-30 ENCOUNTER — Encounter: Payer: Self-pay | Admitting: Emergency Medicine

## 2017-04-30 ENCOUNTER — Observation Stay
Admission: EM | Admit: 2017-04-30 | Discharge: 2017-05-02 | Disposition: A | Discharge status: Home / Self Care | Payer: Medicare Other | Attending: Internal Medicine | Admitting: Internal Medicine

## 2017-04-30 DIAGNOSIS — K219 Gastro-esophageal reflux disease without esophagitis: Secondary | ICD-10-CM | POA: Insufficient documentation

## 2017-04-30 DIAGNOSIS — R52 Pain, unspecified: Secondary | ICD-10-CM | POA: Diagnosis present

## 2017-04-30 DIAGNOSIS — E785 Hyperlipidemia, unspecified: Secondary | ICD-10-CM | POA: Insufficient documentation

## 2017-04-30 DIAGNOSIS — K859 Acute pancreatitis without necrosis or infection, unspecified: Secondary | ICD-10-CM | POA: Insufficient documentation

## 2017-04-30 DIAGNOSIS — Z882 Allergy status to sulfonamides status: Secondary | ICD-10-CM | POA: Insufficient documentation

## 2017-04-30 DIAGNOSIS — F0280 Dementia in other diseases classified elsewhere without behavioral disturbance: Secondary | ICD-10-CM | POA: Insufficient documentation

## 2017-04-30 DIAGNOSIS — G4700 Insomnia, unspecified: Secondary | ICD-10-CM | POA: Insufficient documentation

## 2017-04-30 DIAGNOSIS — Z881 Allergy status to other antibiotic agents status: Secondary | ICD-10-CM | POA: Insufficient documentation

## 2017-04-30 DIAGNOSIS — G8929 Other chronic pain: Secondary | ICD-10-CM | POA: Diagnosis not present

## 2017-04-30 DIAGNOSIS — Z79899 Other long term (current) drug therapy: Secondary | ICD-10-CM | POA: Diagnosis not present

## 2017-04-30 DIAGNOSIS — Z794 Long term (current) use of insulin: Secondary | ICD-10-CM | POA: Insufficient documentation

## 2017-04-30 DIAGNOSIS — E119 Type 2 diabetes mellitus without complications: Secondary | ICD-10-CM | POA: Insufficient documentation

## 2017-04-30 DIAGNOSIS — M1990 Unspecified osteoarthritis, unspecified site: Secondary | ICD-10-CM | POA: Insufficient documentation

## 2017-04-30 DIAGNOSIS — I130 Hypertensive heart and chronic kidney disease with heart failure and stage 1 through stage 4 chronic kidney disease, or unspecified chronic kidney disease: Secondary | ICD-10-CM | POA: Insufficient documentation

## 2017-04-30 DIAGNOSIS — R6510 Systemic inflammatory response syndrome (SIRS) of non-infectious origin without acute organ dysfunction: Secondary | ICD-10-CM | POA: Diagnosis present

## 2017-04-30 DIAGNOSIS — E871 Hypo-osmolality and hyponatremia: Secondary | ICD-10-CM | POA: Diagnosis not present

## 2017-04-30 DIAGNOSIS — N390 Urinary tract infection, site not specified: Secondary | ICD-10-CM | POA: Diagnosis not present

## 2017-04-30 LAB — URINALYSIS, COMPLETE (UACMP) WITH MICROSCOPIC
Bilirubin Urine: NEGATIVE
Glucose, UA: NEGATIVE mg/dL
KETONES UR: NEGATIVE mg/dL
Nitrite: NEGATIVE
PH: 5 (ref 5.0–8.0)
Protein, ur: 100 mg/dL — AB
Specific Gravity, Urine: 1.01 (ref 1.005–1.030)

## 2017-04-30 LAB — HEPATIC FUNCTION PANEL
ALT: 9 U/L — AB (ref 14–54)
AST: 24 U/L (ref 15–41)
Albumin: 3.6 g/dL (ref 3.5–5.0)
Alkaline Phosphatase: 87 U/L (ref 38–126)
BILIRUBIN TOTAL: 0.5 mg/dL (ref 0.3–1.2)
Total Protein: 7.9 g/dL (ref 6.5–8.1)

## 2017-04-30 LAB — CBC WITH DIFFERENTIAL/PLATELET
Basophils Absolute: 0.1 10*3/uL (ref 0–0.1)
Basophils Relative: 1 %
EOS PCT: 1 %
Eosinophils Absolute: 0.2 10*3/uL (ref 0–0.7)
HCT: 39.1 % (ref 35.0–47.0)
Hemoglobin: 13 g/dL (ref 12.0–16.0)
LYMPHS ABS: 3.9 10*3/uL — AB (ref 1.0–3.6)
LYMPHS PCT: 27 %
MCH: 28.5 pg (ref 26.0–34.0)
MCHC: 33.3 g/dL (ref 32.0–36.0)
MCV: 85.7 fL (ref 80.0–100.0)
MONO ABS: 1.5 10*3/uL — AB (ref 0.2–0.9)
Monocytes Relative: 10 %
Neutro Abs: 9.1 10*3/uL — ABNORMAL HIGH (ref 1.4–6.5)
Neutrophils Relative %: 61 %
PLATELETS: 418 10*3/uL (ref 150–440)
RBC: 4.57 MIL/uL (ref 3.80–5.20)
RDW: 14.5 % (ref 11.5–14.5)
WBC: 14.8 10*3/uL — ABNORMAL HIGH (ref 3.6–11.0)

## 2017-04-30 LAB — BASIC METABOLIC PANEL
ANION GAP: 13 (ref 5–15)
BUN: 17 mg/dL (ref 6–20)
CO2: 21 mmol/L — AB (ref 22–32)
Calcium: 9.2 mg/dL (ref 8.9–10.3)
Chloride: 95 mmol/L — ABNORMAL LOW (ref 101–111)
Creatinine, Ser: 1.02 mg/dL — ABNORMAL HIGH (ref 0.44–1.00)
GFR calc Af Amer: 56 mL/min — ABNORMAL LOW (ref >60)
GFR calc non Af Amer: 48 mL/min — ABNORMAL LOW (ref >60)
GLUCOSE: 178 mg/dL — AB (ref 65–99)
POTASSIUM: 4.1 mmol/L (ref 3.5–5.1)
Sodium: 129 mmol/L — ABNORMAL LOW (ref 135–145)

## 2017-04-30 LAB — ACETAMINOPHEN LEVEL: Acetaminophen (Tylenol), Serum: 18 ug/mL (ref 10–30)

## 2017-04-30 LAB — LACTIC ACID, PLASMA: Lactic Acid, Venous: 2.2 mmol/L (ref 0.5–1.9)

## 2017-04-30 LAB — LIPASE, BLOOD: LIPASE: 17 U/L (ref 11–51)

## 2017-04-30 LAB — TROPONIN I: Troponin I: <0.03 ng/mL (ref <0.03)

## 2017-04-30 MED ORDER — IOPAMIDOL (ISOVUE-300) INJECTION 61%
80.0000 mL | Freq: Once | INTRAVENOUS | Status: AC | PRN
  Administered 2017-04-30: 80 mL via INTRAVENOUS

## 2017-04-30 MED ORDER — SODIUM CHLORIDE 0.9 % IV BOLUS (SEPSIS)
500.0000 mL | Freq: Once | INTRAVENOUS | Status: AC
  Administered 2017-04-30: 500 mL via INTRAVENOUS

## 2017-04-30 MED ORDER — PIPERACILLIN-TAZOBACTAM 3.375 G IVPB
3.3750 g | Freq: Once | INTRAVENOUS | Status: AC
  Administered 2017-04-30: 3.375 g via INTRAVENOUS
  Filled 2017-04-30: qty 50

## 2017-04-30 MED ORDER — FENTANYL CITRATE (PF) 100 MCG/2ML IJ SOLN
50.0000 ug | Freq: Once | INTRAMUSCULAR | Status: AC
  Administered 2017-04-30: 50 ug via INTRAVENOUS
  Filled 2017-04-30: qty 2

## 2017-04-30 NOTE — H&P (Signed)
History and Physical   [facility] - [place] @ [facility] Admission History and Physical [employer], D.O.    Patient Name: Cindy Sullivan MR#: [id number] Date of Birth: January 13, 1929 Date of Admission: 04/30/2017  Referring MD/NP/PA: Dr. Burlene Arnt Primary Care Physician: Glendon Axe, MD Patient coming from: Home  Chief Complaint:  Chief Complaint  Patient presents with  . Generalized Body Aches    HPI: Cindy Sullivan is a 83 y.o. female with a known history of Chronic pain, GERD, hypertension, hyperlipidemia, non--insulin dependent diabetes, CK D3, osteoarthritis, Alzheimer's dementia presents to the emergency department for evaluation of body aches.  Patient was in a usual state of health until about 1 day ago when she describes the onset of generalized body and muscle aches involving her head and neck back and abdomen And only moderately relieved with Tylenol. When asked to describe her symptoms she stated "I felt like I was dying."  Patient denies fevers/chills,  dizziness, chest pain, shortness of breath, N/V/C/D, dysuria/frequency, changes in mental status.    Otherwise there has been no change in status. Patient has been taking medication as prescribed and there has been no recent change in medication or diet.  No recent antibiotics.  There has been no recent illness, hospitalizations, travel or sick contacts.    EMS/ED Course: Patient received fentanyl, Zosyn, normal saline. Medical admission was requested for further management of SIRS secondary to urinary tract infection  Review of Systems:  CONSTITUTIONAL: Generalized body aches. No fever/chills, weight gain/loss, headache. EYES: No blurry or double vision. ENT: No tinnitus, postnasal drip, redness or soreness of the oropharynx. RESPIRATORY: No cough, dyspnea, wheeze.  No hemoptysis.  CARDIOVASCULAR: No chest pain, palpitations, syncope, orthopnea. No lower extremity edema.  GASTROINTESTINAL: Positive abdominal pain. No  nausea, vomiting, diarrhea, constipation.  No hematemesis, melena or hematochezia. GENITOURINARY: No dysuria, frequency, hematuria. ENDOCRINE: No polyuria or nocturia. No heat or cold intolerance. HEMATOLOGY: No anemia, bruising, bleeding. INTEGUMENTARY: No rashes, ulcers, lesions. MUSCULOSKELETAL: Positive neck and back pain No arthritis, gout, dyspnea. NEUROLOGIC: No numbness, tingling, ataxia, seizure-type activity, weakness. PSYCHIATRIC: No anxiety, depression, insomnia.   Past Medical History:  Diagnosis Date  . Chest pain syndrome   . Chronic back pain   . Chronic diarrhea   . GERD (gastroesophageal reflux disease)   . H/O: GI bleed   . Hyperlipidemia   . Hypertension   . IDA (iron deficiency anemia)   . Insomnia   . Non-insulin dependent type 2 diabetes mellitus ([place])   . Pancreatitis   Active Problems Reconcile with Patient's Chart  Problem Noted Date  Gastroesophageal reflux disease without esophagitis 08/02/2016  Controlled type 2 diabetes mellitus without complication, with long-term current use of insulin (CMS-HCC) 08/02/2016  Urinary incontinence in female 08/02/2016  Pseudomonas urinary tract infection 02/19/2016  Senile dementia of Alzheimer's type 01/18/2016  Controlled type 2 diabetes mellitus with stage 3 chronic kidney disease, with long-term current use of insulin (CMS-HCC) 01/18/2016  Essential hypertension 04/07/2015  Chronic hyponatremia 04/07/2015  Diabetes mellitus type 2, controlled (CMS-HCC) 04/07/2015  CKD (chronic kidney disease) stage 3, GFR 30-59 ml/min 04/07/2015  Primary osteoarthritis of right knee 02/24/2015  Chronic diarrhea 02/03/2015  Chronic insomnia 02/02/2015  SDAT (senile dementia of Alzheimer's type) 02/02/2015  Primary osteoarthritis of both knees 02/02/2015  Essential hypertension, benign 09/12/2014  Pure hypercholesterolemia 09/12/2014  Type 2 diabetes mellitus with peripheral neuropathy (CMS-HCC)      Past Surgical  History:  Procedure Laterality Date  . APPENDECTOMY    .  CHOLECYSTECTOMY    . EXPLORATORY LAPAROTOMY    . HERNIA REPAIR     x 2  . KNEE ARTHROSCOPY Left   . TOTAL ABDOMINAL HYSTERECTOMY W/ BILATERAL SALPINGOOPHORECTOMY     one ovary still left     reports that she has never smoked. She has never used smokeless tobacco. She reports that she does not drink alcohol or use drugs.  Allergies  Allergen Reactions  . Codeine Other (See Comments)    Other reaction(s): Headache Reaction:  Headaches   . Escitalopram Oxalate     Other reaction(s): Other (See Comments) Agitation  . Metronidazole Nausea Only  . Other     Other reaction(s): Unknown anti- Inflammatory and bee stings  . Pantoprazole Nausea Only  . Phenergan [Promethazine Hcl] Other (See Comments)    Reaction:  Unknown   . Sulfamethoxazole-Trimethoprim     Other reaction(s): Unknown    Family History   Medical History Relation Name Comments  Diabetes type II Brother    Heart failure Father    Lupus Mother    Stroke Mother       Prior to Admission medications   Medication Sig Start Date End Date Taking? Authorizing Provider  acetaminophen (TYLENOL) 500 MG tablet Take 500 mg by mouth 2 (two) times daily.    Yes [provider]  atorvastatin (LIPITOR) 20 MG tablet Take 20 mg by mouth at bedtime.    Yes [provider]  insulin NPH-regular Human (NOVOLIN 70/30) (70-30) 100 UNIT/ML injection Inject 3-25 Units into the skin 2 (two) times daily. Pt uses 20-25 units in the morning. and 3-5 units in the evening.   Yes [provider]  insulin regular (NOVOLIN R,HUMULIN R) 100 units/mL injection Inject 2-10 Units into the skin 2 (two) times daily before a meal. Pt uses as needed per sliding scale:    151-200:  2 units  201-250:  4 units 251-300:  6 units  301-350:  8 units 351-400:  10 units   Yes [provider]  irbesartan (AVAPRO) 150 MG tablet Take 150 mg by mouth daily.    Yes [provider]  memantine (NAMENDA) 5 MG tablet Take 5 mg by mouth 2 (two) times daily.    Yes [provider]  omeprazole (PRILOSEC) 40 MG capsule Take 40 mg by mouth daily.    Yes [provider]  oxybutynin (DITROPAN-XL) 5 MG 24 hr tablet Take 5 mg by mouth 2 (two) times daily.    Yes [provider]  traMADol (ULTRAM) 50 MG tablet Take 1 tablet (50 mg total) by mouth every 6 (six) hours as needed for moderate pain. 04/16/16  Yes Dustin Flock, MD    Physical Exam: Vitals:   04/30/17 1928 04/30/17 2117 04/30/17 2206  BP: 136/73 (!) 150/73 (!) 169/80  Pulse: 78 72 69  Resp: 17 18 20   Temp: 98.3 F (36.8 C)    TempSrc: Oral    SpO2: 94% 96% 94%  Weight: 62.1 kg (136 lb 14.5 oz)    Height: 4\' 9"  (1.448 m)      GENERAL: 83 y.o.-year-old Female patient, well-developed, well-nourished lying in the bed in no acute distress.  Pleasant and cooperative.   HEENT: Head atraumatic, normocephalic. Pupils equal, round, reactive to light and accommodation. No scleral icterus. Extraocular muscles intact Mucus membranes moist. NECK: Supple, full range of motion.  CHEST: Normal breath sounds bilaterally. No wheezing, rales, rhonchi or crackles CARDIOVASCULAR: S1, S2 normal. No murmurs, rubs, or gallops.  Cap refill <2 seconds. Pulses intact distally.  ABDOMEN: Soft, mild diffuse tenderness. No rebound, guarding, rigidity. Mild positive super pubic tenderness EXTREMITIES: No pedal edema, cyanosis, or clubbing. No calf tenderness or Homan's sign.  NEUROLOGIC: The patient is alert and oriented x 3. Cranial nerves II through XII are grossly intact with no focal sensorimotor deficit.  PSYCHIATRIC:  Normal affect, mood, thought content. SKIN: Warm, dry, and intact without obvious rash, lesion, or ulcer.    Labs on Admission:  CBC:  Recent Labs Lab 04/30/17 1951  WBC 14.8*  NEUTROABS 9.1*  HGB 13.0  HCT 39.1  MCV 85.7  PLT 660   Basic Metabolic  Panel:  Recent Labs Lab 04/30/17 1951  NA 129*  K 4.1  CL 95*  CO2 21*  GLUCOSE 178*  BUN 17  CREATININE 1.02*  CALCIUM 9.2   GFR: Estimated Creatinine Clearance: 30 mL/min (A) (by C-G formula based on SCr of 1.02 mg/dL (H)). Liver Function Tests:  Recent Labs Lab 04/30/17 1951  AST 24  ALT 9*  ALKPHOS 87  BILITOT 0.5  PROT 7.9  ALBUMIN 3.6    Recent Labs Lab 04/30/17 1951  LIPASE 17   No results for input(s): AMMONIA in the last 168 hours. Coagulation Profile: No results for input(s): INR, PROTIME in the last 168 hours. Cardiac Enzymes:  Recent Labs Lab 04/30/17 1951  TROPONINI <0.03   BNP (last 3 results) No results for input(s): PROBNP in the last 8760 hours. HbA1C: No results for input(s): HGBA1C in the last 72 hours. CBG: No results for input(s): GLUCAP in the last 168 hours. Lipid Profile: No results for input(s): CHOL, HDL, LDLCALC, TRIG, CHOLHDL, LDLDIRECT in the last 72 hours. Thyroid Function Tests: No results for input(s): TSH, T4TOTAL, FREET4, T3FREE, THYROIDAB in the last 72 hours. Anemia Panel: No results for input(s): VITAMINB12, FOLATE, FERRITIN, TIBC, IRON, RETICCTPCT in the last 72 hours. Urine analysis:    Component Value Date/Time   COLORURINE YELLOW (A) 04/30/2017 1951   APPEARANCEUR CLOUDY (A) 04/30/2017 1951   APPEARANCEUR Turbid 05/17/2014 1947   LABSPEC 1.010 04/30/2017 1951   LABSPEC 1.013 05/17/2014 1947   PHURINE 5.0 04/30/2017 1951   GLUCOSEU NEGATIVE 04/30/2017 1951   GLUCOSEU Negative 05/17/2014 1947   HGBUR SMALL (A) 04/30/2017 [place] NEGATIVE 04/30/2017 [place] Negative 05/17/2014 [place] NEGATIVE 04/30/2017 1951   PROTEINUR 100 (A) 04/30/2017 1951   NITRITE NEGATIVE 04/30/2017 1951   LEUKOCYTESUR LARGE (A) 04/30/2017 1951   LEUKOCYTESUR 3+ 05/17/2014 1947   Sepsis Labs: @LABRCNTIP (procalcitonin:4,lacticidven:4) )No results found for this or any previous visit (from the past  240 hour(s)).   Radiological Exams on Admission: Dg Chest 2 View  Result Date: 04/30/2017 CLINICAL DATA:  Chest pain. EXAM: CHEST  2 VIEW COMPARISON:  05/17/2014 FINDINGS: Midline trachea. Mild cardiomegaly. Right rotator cuff repair. Tortuous thoracic aorta. Atherosclerosis in the transverse aorta. Lateral view degraded by patient arm position. No pleural effusion or pneumothorax. Mild medial right lower lobe scarring. No lobar consolidation. IMPRESSION: No acute cardiopulmonary disease. Aortic Atherosclerosis (ICD10-I70.0). Electronically Signed   By: Abigail Miyamoto M.D.   On: 04/30/2017 19:55   Ct Abdomen Pelvis W Contrast  Result Date: 04/30/2017 CLINICAL DATA:  Initial evaluation for acute right-sided abdominal pain with nausea. EXAM: CT ABDOMEN AND PELVIS WITH CONTRAST TECHNIQUE: Multidetector CT imaging of the abdomen and pelvis was performed using the standard protocol following bolus administration of intravenous contrast. CONTRAST:  57mL ISOVUE-300 IOPAMIDOL (ISOVUE-300)  INJECTION 61% COMPARISON:  Prior CT from 04/13/2016. FINDINGS: Lower chest: 4 mm nodule present within the right lower lobe (series 4, image 3), indeterminate. Minimal subsegmental atelectasis present at the left lung base the. Visualized lung bases are otherwise clear. Hepatobiliary: Liver demonstrates a normal contrast enhanced appearance. Gallbladder surgically absent. Common bile duct dilated up to a 17 mm, relatively similar to previous. Associated intrahepatic biliary dilatation also similar. Pancreas: Pancreas atrophic without mass lesion or acute inflammatory changes. Spleen: Spleen within normal limits. Adrenals/Urinary Tract: Adrenal glands within normal limits. Kidneys equal in size with symmetric enhancement. Scattered cortical thinning and scarring noted bilaterally. 2.6 cm cyst present at the lower pole of the left kidney. Additional subcentimeter hypodensities within left kidney too small the characterize, but  statistically likely reflects small cysts as well. No nephrolithiasis, hydronephrosis, or focal enhancing renal mass. No hydroureter. Bladder largely decompressed. Mild circumferential wall thickening like related incomplete distension. Cast lucency within the bladder lumen may be related to recent catheterization. Acute infection/cystitis could also be considered. Stomach/Bowel: Small hiatal hernia noted. Stomach otherwise unremarkable. No evidence for bowel obstruction. Appendix is absent. Sigmoid diverticulosis without evidence for acute diverticulitis. No acute inflammatory changes seen about the bowels. Vascular/Lymphatic: Advanced aorto bi-iliac atherosclerotic disease. No aneurysm. Normal intravascular enhancement seen throughout the intra-abdominal aorta and its branch vessels. No adenopathy. Reproductive: Uterus is surgically absent. Ovaries not discretely identified. Other: No free air or fluid. Musculoskeletal: No acute osseous abnormality. No worrisome lytic or blastic osseous lesions. IMPRESSION: 1. Intraluminal gas within the urinary bladder, suspected to be related to recent instrumentation. Acute cystitis could also be considered in the correct clinical setting. Correlation with urinalysis recommended. 2. Status post cholecystectomy with prominent intra and extrahepatic biliary dilatation, similar to previous. 3. Sigmoid diverticulosis without evidence for acute diverticulitis. 4. Advanced aorto bi-iliac atherosclerotic disease without aneurysm. 5. Small hiatal hernia. Electronically Signed   By: Jeannine Boga M.D.   On: 04/30/2017 21:27    EKG: Normal sinus rhythm at 79 bpm with normal axis and nonspecific ST-T wave changes.   Assessment/Plan  This is a 83 y.o. female with a history of Chronic pain, GERD, hypertension, hyperlipidemia, non--insulin dependent diabetes, CK D3, osteoarthritis, Alzheimer's dementia now being admitted with:  #. SIRS 2/2 urinary tract infection -Admit to  observation -Continue IV antibiotics: Zosyn due to drug resistant Pseudomonas and Citrobacter -Follow-up cultures  #. Hyponatremia, mild -Continue IV fluid hydration  #. History of hypertension -Continue Avapro  #. History of history of hyperlipidemia - Continue Lipitor  #. History of dementia - Continue Namenda  #. History of GERD - Continue Prilosec  #. History of urinary incontinence - Continue oxybutynin  #. History of diabetes - Continue 70/30 -Accu-Cheks before meals and at bedtime with regular insulin sliding scale coverage  Admission status: Observation IV Fluids: Normal saline Diet/Nutrition: Heart healthy, carb controlled Consults called: None  DVT Px: Lovenox, SCDs and early ambulation. Code Status: Full Code  Disposition Plan: To home in 1-2 days  All the records are reviewed and case discussed with ED provider. Management plans discussed with the patient and/or family who express understanding and agree with plan of care.  Arn Mcomber D.O. on 04/30/2017 at 10:39 PM Between 7am to 6pm - Pager - [phone] After 6pm go to [email or web address] - [employer] [facility] [place] [facility] [phone] CC: Primary care physician; Glendon Axe, MD   04/30/2017, 10:39 PM

## 2017-04-30 NOTE — ED Notes (Signed)
Cindy Sullivan, [phone], going home will return

## 2017-04-30 NOTE — ED Notes (Signed)
Patient transported to X-ray 

## 2017-04-30 NOTE — ED Triage Notes (Signed)
Pt arrives via [facility] from home with c/o generalized pain throughout. Per [facility], VS WDL. Pt is in NAD at this time.

## 2017-04-30 NOTE — ED Provider Notes (Signed)
[facility] Emergency Department Provider Note  ____________________________________________   First MD Initiated Contact with Patient 04/30/17 1926     (approximate)  I have reviewed the triage vital signs and the nursing notes.   HISTORY  Chief Complaint Generalized Body Aches  HPI Cindy Sullivan is a 83 y.o. female whocomes to the emergency department with roughly 24 hours of worsening diffuse body pain. Her pain is moderate in severity and difficult to quantify. She feels aching and throbbing in her back abdomen neck and head. She denies fevers or chills. She denies nausea or vomiting. She denies dysuria frequency or hesitancy. She lives at home alone but her brother lives around the corner and checked on her today and noted that she was in worsening pain so she called 911. She has been taking Tylenol since yesterday which she says has helped somewhat.  Past Medical History:  Diagnosis Date  . Chest pain syndrome   . Chronic back pain   . Chronic diarrhea   . GERD (gastroesophageal reflux disease)   . H/O: GI bleed   . Hyperlipidemia   . Hypertension   . IDA (iron deficiency anemia)   . Insomnia   . Non-insulin dependent type 2 diabetes mellitus ([place])   . Pancreatitis     Patient Active Problem List   Diagnosis Date Noted  . Type 2 diabetes mellitus ([place]) 01/18/2016  . Alzheimer's dementia, late onset 01/18/2016  . Iron deficiency anemia due to chronic blood loss 12/30/2015  . Chronic hyponatremia 04/07/2015  . Chronic kidney disease (CKD), stage III (moderate) 04/07/2015  . Essential (primary) hypertension 04/07/2015  . Chronic diarrhea 02/03/2015  . Insomnia, persistent 02/02/2015  . Primary osteoarthritis of both knees 02/02/2015  . Pure hypercholesterolemia 09/12/2014    Past Surgical History:  Procedure Laterality Date  . APPENDECTOMY    . CHOLECYSTECTOMY    . EXPLORATORY LAPAROTOMY    . HERNIA REPAIR     x 2  . KNEE ARTHROSCOPY  Left   . TOTAL ABDOMINAL HYSTERECTOMY W/ BILATERAL SALPINGOOPHORECTOMY     one ovary still left    Prior to Admission medications   Medication Sig Start Date End Date Taking? Authorizing Provider  acetaminophen (TYLENOL) 500 MG tablet Take 500 mg by mouth 2 (two) times daily.     [provider]  atorvastatin (LIPITOR) 20 MG tablet Take 20 mg by mouth at bedtime.     [provider]  insulin NPH-regular Human (NOVOLIN 70/30) (70-30) 100 UNIT/ML injection Inject 3-10 Units into the skin 2 (two) times daily. Pt uses 15 units in the morning. and 3 units in the evening.    [provider]  insulin regular (NOVOLIN R,HUMULIN R) 100 units/mL injection Inject 2-10 Units into the skin 3 (three) times daily with meals as needed for high blood sugar. Pt uses as needed per sliding scale:    151-200:  2 units  201-250:  4 units 251-300:  6 units  301-350:  8 units 351-400:  10 units    [provider]  irbesartan (AVAPRO) 150 MG tablet Take 150 mg by mouth daily.    [provider]  lidocaine-prilocaine (EMLA) cream Apply 1 application topically once. Port a cath 03/23/16   [provider]  memantine (NAMENDA) 5 MG tablet Take 5 mg by mouth 2 (two) times daily.     [provider]  omeprazole (PRILOSEC) 40 MG capsule Take 40 mg by mouth daily.  [provider]  oxybutynin (DITROPAN-XL) 5 MG 24 hr tablet Take 5 mg by mouth 2 (two) times daily.     [provider]  traMADol (ULTRAM) 50 MG tablet Take 1 tablet (50 mg total) by mouth every 6 (six) hours as needed for moderate pain. 04/16/16   Dustin Flock, MD    Allergies Codeine; Escitalopram oxalate; Metronidazole; Other; Pantoprazole; Phenergan [promethazine hcl]; and Sulfamethoxazole-trimethoprim  No family history on file.  Social History Social History  Substance Use Topics  . Smoking status: Never Smoker  . Smokeless tobacco: Never Used  . Alcohol use No     Review of Systems Constitutional: No fever/chills Eyes: No visual changes. ENT: No sore throat. Cardiovascular: Denies chest pain. Respiratory: Denies shortness of breath. Gastrointestinal: Positive abdominal pain.  No nausea, no vomiting.  No diarrhea.  No constipation. Genitourinary: Negative for dysuria. Musculoskeletal: Negative for back pain. Skin: Negative for rash. Neurological: Positive for headache   ____________________________________________   PHYSICAL EXAM:  VITAL SIGNS: ED Triage Vitals  Enc Vitals Group     BP      Pulse      Resp      Temp      Temp src      SpO2      Weight      Height      Head Circumference      Peak Flow      Pain Score      Pain Loc      Pain Edu?      Excl. in [place]?     Constitutional: 11 cooperative no acute distress speaks in full clear sentences Eyes: PERRL EOMI. Head: Atraumatic. Nose: No congestion/rhinnorhea. Mouth/Throat: No trismus Neck: No stridor.   Cardiovascular: Normal rate, regular rhythm. Grossly normal heart sounds.  Good peripheral circulation. Respiratory: Normal respiratory effort.  No retractions. Lungs CTAB and moving good air Gastrointestinal: Soft mild diffuse tenderness without focality no rebound or guarding no peritonitis Musculoskeletal: No lower extremity edema   Neurologic:  Normal speech and language. No gross focal neurologic deficits are appreciated. Skin:  Skin is warm, dry and intact. No rash noted. Psychiatric: Mood and affect are normal. Speech and behavior are normal.    ____________________________________________   DIFFERENTIAL includes but not limited to  Pancreatitis, diverticulitis, appendicitis, metabolic drainage me, urinary tract infection, pyelonephritis ____________________________________________   LABS (all labs ordered are listed, but only abnormal results are displayed)  Labs Reviewed  BASIC METABOLIC PANEL - Abnormal; Notable for the following:       Result  Value   Sodium 129 (*)    Chloride 95 (*)    CO2 21 (*)    Glucose, Bld 178 (*)    Creatinine, Ser 1.02 (*)    GFR calc non Af Amer 48 (*)    GFR calc Af Amer 56 (*)    All other components within normal limits  HEPATIC FUNCTION PANEL - Abnormal; Notable for the following:    ALT 9 (*)    Bilirubin, Direct <0.1 (*)    All other components within normal limits  CBC WITH DIFFERENTIAL/PLATELET - Abnormal; Notable for the following:    WBC 14.8 (*)    Neutro Abs 9.1 (*)    Lymphs Abs 3.9 (*)    Monocytes Absolute 1.5 (*)    All other components within normal limits  TROPONIN I  ACETAMINOPHEN LEVEL  LACTIC ACID, PLASMA  LACTIC ACID, PLASMA  URINALYSIS, COMPLETE (UACMP) WITH MICROSCOPIC  LIPASE, BLOOD    Elevated white count nonspecific __________________________________________  EKG  ED ECG REPORT I, Darel Hong, the attending physician, personally viewed and interpreted this ECG.  Date: 04/30/2017 Rate: 79 Rhythm: normal sinus rhythm QRS Axis: normal Intervals: normal ST/T Wave abnormalities: normal Narrative Interpretation: unremarkable  ____________________________________________  RADIOLOGY  Chest x-ray with no acute disease ____________________________________________   PROCEDURES  Procedure(s) performed: no  Procedures  Critical Care performed: no  Observation: no ____________________________________________   INITIAL IMPRESSION / ASSESSMENT AND PLAN / ED COURSE  Pertinent labs & imaging results that were available during my care of the patient were reviewed by me and considered in my medical decision making (see chart for details).  The patient arrives hemodynamically stable and well appearing. Differential is broad although her abdominal discomfort and mild tenderness is concerning in an 83 year old woman. Broad labs including an in and out urinalysis as well as a CT abdomen and pelvis are pending.       ____________________________________________   FINAL CLINICAL IMPRESSION(S) / ED DIAGNOSES  Final diagnoses:  Pain      NEW MEDICATIONS STARTED DURING THIS VISIT:  New Prescriptions   No medications on file     Note:  This document was prepared using Dragon voice recognition software and may include unintentional dictation errors.     Darel Hong, MD 04/30/17 2043

## 2017-04-30 NOTE — ED Notes (Signed)
CRITICAL LAB: LACTIC is 2.2, [employer], Dr. Mable Paris notified, orders received

## 2017-05-01 DIAGNOSIS — N390 Urinary tract infection, site not specified: Secondary | ICD-10-CM | POA: Diagnosis not present

## 2017-05-01 LAB — GLUCOSE, CAPILLARY
GLUCOSE-CAPILLARY: 110 mg/dL — AB (ref 65–99)
GLUCOSE-CAPILLARY: 164 mg/dL — AB (ref 65–99)
Glucose-Capillary: 105 mg/dL — ABNORMAL HIGH (ref 65–99)
Glucose-Capillary: 140 mg/dL — ABNORMAL HIGH (ref 65–99)
Glucose-Capillary: 172 mg/dL — ABNORMAL HIGH (ref 65–99)

## 2017-05-01 LAB — BASIC METABOLIC PANEL
Anion gap: 6 (ref 5–15)
BUN: 14 mg/dL (ref 6–20)
CHLORIDE: 102 mmol/L (ref 101–111)
CO2: 25 mmol/L (ref 22–32)
CREATININE: 0.94 mg/dL (ref 0.44–1.00)
Calcium: 8.7 mg/dL — ABNORMAL LOW (ref 8.9–10.3)
GFR calc Af Amer: >60 mL/min (ref >60)
GFR calc non Af Amer: 53 mL/min — ABNORMAL LOW (ref >60)
GLUCOSE: 186 mg/dL — AB (ref 65–99)
Potassium: 4.1 mmol/L (ref 3.5–5.1)
Sodium: 133 mmol/L — ABNORMAL LOW (ref 135–145)

## 2017-05-01 LAB — CBC
HCT: 37.2 % (ref 35.0–47.0)
Hemoglobin: 12.5 g/dL (ref 12.0–16.0)
MCH: 28.7 pg (ref 26.0–34.0)
MCHC: 33.5 g/dL (ref 32.0–36.0)
MCV: 85.7 fL (ref 80.0–100.0)
PLATELETS: 353 10*3/uL (ref 150–440)
RBC: 4.35 MIL/uL (ref 3.80–5.20)
RDW: 14.1 % (ref 11.5–14.5)
WBC: 12.7 10*3/uL — ABNORMAL HIGH (ref 3.6–11.0)

## 2017-05-01 LAB — LACTIC ACID, PLASMA: LACTIC ACID, VENOUS: 1.7 mmol/L (ref 0.5–1.9)

## 2017-05-01 MED ORDER — IRBESARTAN 150 MG PO TABS
150.0000 mg | ORAL_TABLET | Freq: Every day | ORAL | Status: DC
  Administered 2017-05-01 – 2017-05-02 (×2): 150 mg via ORAL
  Filled 2017-05-01 (×2): qty 1

## 2017-05-01 MED ORDER — ATORVASTATIN CALCIUM 20 MG PO TABS
20.0000 mg | ORAL_TABLET | Freq: Every day | ORAL | Status: DC
  Administered 2017-05-01: 20 mg via ORAL
  Filled 2017-05-01: qty 1

## 2017-05-01 MED ORDER — PIPERACILLIN-TAZOBACTAM 3.375 G IVPB
3.3750 g | Freq: Three times a day (TID) | INTRAVENOUS | Status: DC
  Administered 2017-05-01 – 2017-05-02 (×2): 3.375 g via INTRAVENOUS
  Filled 2017-05-01 (×2): qty 50

## 2017-05-01 MED ORDER — ALBUTEROL SULFATE (2.5 MG/3ML) 0.083% IN NEBU: 2.5000 mg | INHALATION_SOLUTION | Freq: Four times a day (QID) | RESPIRATORY_TRACT | Status: DC | PRN

## 2017-05-01 MED ORDER — ACETAMINOPHEN 650 MG RE SUPP: 650.0000 mg | Freq: Four times a day (QID) | RECTAL | Status: DC | PRN

## 2017-05-01 MED ORDER — SENNOSIDES-DOCUSATE SODIUM 8.6-50 MG PO TABS: 1.0000 | ORAL_TABLET | Freq: Every evening | ORAL | Status: DC | PRN

## 2017-05-01 MED ORDER — INSULIN ASPART PROT & ASPART (70-30 MIX) 100 UNIT/ML ~~LOC~~ SUSP
3.0000 [IU] | Freq: Every evening | SUBCUTANEOUS | Status: DC
  Administered 2017-05-01: 3 [IU] via SUBCUTANEOUS
  Filled 2017-05-01: qty 10

## 2017-05-01 MED ORDER — SODIUM CHLORIDE 0.9 % IV SOLN
INTRAVENOUS | Status: DC
  Administered 2017-05-01 (×2): via INTRAVENOUS

## 2017-05-01 MED ORDER — INSULIN ASPART 100 UNIT/ML ~~LOC~~ SOLN
0.0000 [IU] | Freq: Three times a day (TID) | SUBCUTANEOUS | Status: DC
  Administered 2017-05-01: 2 [IU] via SUBCUTANEOUS
  Administered 2017-05-01: 1 [IU] via SUBCUTANEOUS
  Administered 2017-05-02: 2 [IU] via SUBCUTANEOUS
  Filled 2017-05-01 (×3): qty 1

## 2017-05-01 MED ORDER — MAGNESIUM CITRATE PO SOLN
1.0000 | Freq: Once | ORAL | Status: DC | PRN
  Filled 2017-05-01: qty 296

## 2017-05-01 MED ORDER — CIPROFLOXACIN IN D5W 400 MG/200ML IV SOLN
400.0000 mg | Freq: Two times a day (BID) | INTRAVENOUS | Status: DC
  Administered 2017-05-01: 400 mg via INTRAVENOUS
  Filled 2017-05-01 (×3): qty 200

## 2017-05-01 MED ORDER — ENOXAPARIN SODIUM 30 MG/0.3ML ~~LOC~~ SOLN: 30.0000 mg | SUBCUTANEOUS | Status: DC

## 2017-05-01 MED ORDER — ALPRAZOLAM 0.25 MG PO TABS
0.2500 mg | ORAL_TABLET | Freq: Three times a day (TID) | ORAL | Status: DC | PRN
  Administered 2017-05-01 – 2017-05-02 (×2): 0.25 mg via ORAL
  Filled 2017-05-01 (×2): qty 1

## 2017-05-01 MED ORDER — INSULIN ASPART 100 UNIT/ML ~~LOC~~ SOLN
0.0000 [IU] | Freq: Every day | SUBCUTANEOUS | Status: DC
  Administered 2017-05-01: 2 [IU] via SUBCUTANEOUS

## 2017-05-01 MED ORDER — ACETAMINOPHEN 325 MG PO TABS: 650.0000 mg | ORAL_TABLET | Freq: Four times a day (QID) | ORAL | Status: DC | PRN

## 2017-05-01 MED ORDER — BISACODYL 5 MG PO TBEC: 5.0000 mg | DELAYED_RELEASE_TABLET | Freq: Every day | ORAL | Status: DC | PRN

## 2017-05-01 MED ORDER — ONDANSETRON HCL 4 MG PO TABS: 4.0000 mg | ORAL_TABLET | Freq: Four times a day (QID) | ORAL | Status: DC | PRN

## 2017-05-01 MED ORDER — TRAMADOL HCL 50 MG PO TABS
50.0000 mg | ORAL_TABLET | Freq: Two times a day (BID) | ORAL | Status: DC | PRN
  Administered 2017-05-01: 50 mg via ORAL
  Filled 2017-05-01: qty 1

## 2017-05-01 MED ORDER — ENOXAPARIN SODIUM 40 MG/0.4ML ~~LOC~~ SOLN
40.0000 mg | SUBCUTANEOUS | Status: DC
  Administered 2017-05-01: 40 mg via SUBCUTANEOUS
  Filled 2017-05-01: qty 0.4

## 2017-05-01 MED ORDER — PIPERACILLIN-TAZOBACTAM 3.375 G IVPB
3.3750 g | Freq: Three times a day (TID) | INTRAVENOUS | Status: DC
  Administered 2017-05-01: 3.375 g via INTRAVENOUS
  Filled 2017-05-01: qty 50

## 2017-05-01 MED ORDER — INSULIN ASPART PROT & ASPART (70-30 MIX) 100 UNIT/ML ~~LOC~~ SUSP
20.0000 [IU] | Freq: Every morning | SUBCUTANEOUS | Status: DC
  Administered 2017-05-01: 20 [IU] via SUBCUTANEOUS
  Filled 2017-05-01: qty 10

## 2017-05-01 MED ORDER — IPRATROPIUM BROMIDE 0.02 % IN SOLN: 0.5000 mg | Freq: Four times a day (QID) | RESPIRATORY_TRACT | Status: DC | PRN

## 2017-05-01 MED ORDER — TRAMADOL HCL 50 MG PO TABS
50.0000 mg | ORAL_TABLET | Freq: Four times a day (QID) | ORAL | Status: DC | PRN
  Administered 2017-05-01 – 2017-05-02 (×2): 50 mg via ORAL
  Filled 2017-05-01 (×2): qty 1

## 2017-05-01 MED ORDER — KETOROLAC TROMETHAMINE 15 MG/ML IJ SOLN
15.0000 mg | Freq: Four times a day (QID) | INTRAMUSCULAR | Status: DC | PRN
Start: 2017-05-01 — End: 2017-05-02
  Administered 2017-05-01: 15 mg via INTRAVENOUS
  Filled 2017-05-01 (×2): qty 1

## 2017-05-01 MED ORDER — KETOROLAC TROMETHAMINE 15 MG/ML IJ SOLN
15.0000 mg | Freq: Once | INTRAMUSCULAR | Status: AC
  Administered 2017-05-01: 15 mg via INTRAVENOUS
  Filled 2017-05-01: qty 1

## 2017-05-01 MED ORDER — MEMANTINE HCL 5 MG PO TABS
5.0000 mg | ORAL_TABLET | Freq: Two times a day (BID) | ORAL | Status: DC
  Administered 2017-05-01 – 2017-05-02 (×3): 5 mg via ORAL
  Filled 2017-05-01 (×3): qty 1

## 2017-05-01 MED ORDER — INSULIN ASPART PROT & ASPART (70-30 MIX) 100 UNIT/ML ~~LOC~~ SUSP: 20.0000 [IU] | Freq: Every day | SUBCUTANEOUS | Status: DC

## 2017-05-01 MED ORDER — ONDANSETRON HCL 4 MG/2ML IJ SOLN: 4.0000 mg | Freq: Four times a day (QID) | INTRAMUSCULAR | Status: DC | PRN

## 2017-05-01 MED ORDER — ACETAMINOPHEN 500 MG PO TABS
500.0000 mg | ORAL_TABLET | Freq: Two times a day (BID) | ORAL | Status: DC
  Administered 2017-05-01 – 2017-05-02 (×3): 500 mg via ORAL
  Filled 2017-05-01 (×3): qty 1

## 2017-05-01 NOTE — Progress Notes (Signed)
[facility] [place] NAME: Cindy Sullivan    MR#:  [id number]  DATE OF BIRTH:  Jul 19, 1929  SUBJECTIVE:  CHIEF COMPLAINT:   Chief Complaint  Patient presents with  . Generalized Body Aches   Still has generalized body aching, back pain and leg pain, dysuria and incontinence. Chronic diarrhea. REVIEW OF SYSTEMS:  Review of Systems  Constitutional: Positive for malaise/fatigue. Negative for chills and fever.  HENT: Negative for sore throat.   Eyes: Negative for blurred vision and double vision.  Respiratory: Negative for cough, shortness of breath, wheezing and stridor.   Cardiovascular: Negative for chest pain and leg swelling.  Gastrointestinal: Positive for diarrhea. Negative for abdominal pain, blood in stool, melena, nausea and vomiting.  Genitourinary: Positive for dysuria and frequency. Negative for flank pain and hematuria.  Musculoskeletal: Positive for back pain and joint pain.  Skin: Negative for itching and rash.  Neurological: Positive for weakness. Negative for dizziness, focal weakness, loss of consciousness and headaches.  Psychiatric/Behavioral: Negative for depression. The patient is not nervous/anxious.     DRUG ALLERGIES:   Allergies  Allergen Reactions  . Codeine Other (See Comments)    Other reaction(s): Headache Reaction:  Headaches   . Escitalopram Oxalate     Other reaction(s): Other (See Comments) Agitation  . Metronidazole Nausea Only  . Other     Other reaction(s): Unknown anti- Inflammatory and bee stings  . Pantoprazole Nausea Only  . Phenergan [Promethazine Hcl] Other (See Comments)    Reaction:  Unknown   . Sulfamethoxazole-Trimethoprim     Other reaction(s): Unknown   VITALS:  Blood pressure (!) 165/70, pulse 71, temperature 97.6 F (36.4 C), temperature source Oral, resp. rate 18, height 4\' 7"  (1.397 m), weight 137 lb 11.2 oz (62.5 kg), SpO2 98 %. PHYSICAL EXAMINATION:  Physical Exam    Constitutional: She is oriented to person, place, and time and well-developed, well-nourished, and in no distress.  HENT:  Head: Normocephalic.  Mouth/Throat: Oropharynx is clear and moist.  Eyes: Conjunctivae and EOM are normal.  Neck: Normal range of motion. Neck supple. No JVD present. No thyromegaly present.  Cardiovascular: Normal rate, regular rhythm and normal heart sounds.  Exam reveals no gallop.   No murmur heard. Pulmonary/Chest: Effort normal and breath sounds normal. No respiratory distress. She has no wheezes. She has no rales.  Abdominal: Soft. Bowel sounds are normal. She exhibits no distension. There is no tenderness. There is no rebound and no guarding.  Musculoskeletal: Normal range of motion. She exhibits no edema or tenderness.  Neurological: She is alert and oriented to person, place, and time. No cranial nerve deficit.  Skin: No rash noted. No erythema.  Psychiatric: Affect normal.   LABORATORY PANEL:  Female CBC  Recent Labs Lab 05/01/17 0434  WBC 12.7*  HGB 12.5  HCT 37.2  PLT 353   ------------------------------------------------------------------------------------------------------------------ Chemistries   Recent Labs Lab 04/30/17 1951 05/01/17 0434  NA 129* 133*  K 4.1 4.1  CL 95* 102  CO2 21* 25  GLUCOSE 178* 186*  BUN 17 14  CREATININE 1.02* 0.94  CALCIUM 9.2 8.7*  AST 24  --   ALT 9*  --   ALKPHOS 87  --   BILITOT 0.5  --    RADIOLOGY:  Dg Chest 2 View  Result Date: 04/30/2017 CLINICAL DATA:  Chest pain. EXAM: CHEST  2 VIEW COMPARISON:  05/17/2014 FINDINGS: Midline trachea. Mild cardiomegaly. Right rotator cuff repair. Tortuous thoracic  aorta. Atherosclerosis in the transverse aorta. Lateral view degraded by patient arm position. No pleural effusion or pneumothorax. Mild medial right lower lobe scarring. No lobar consolidation. IMPRESSION: No acute cardiopulmonary disease. Aortic Atherosclerosis (ICD10-I70.0). Electronically Signed    By: Abigail Miyamoto M.D.   On: 04/30/2017 19:55   Ct Abdomen Pelvis W Contrast  Result Date: 04/30/2017 CLINICAL DATA:  Initial evaluation for acute right-sided abdominal pain with nausea. EXAM: CT ABDOMEN AND PELVIS WITH CONTRAST TECHNIQUE: Multidetector CT imaging of the abdomen and pelvis was performed using the standard protocol following bolus administration of intravenous contrast. CONTRAST:  54mL ISOVUE-300 IOPAMIDOL (ISOVUE-300) INJECTION 61% COMPARISON:  Prior CT from 04/13/2016. FINDINGS: Lower chest: 4 mm nodule present within the right lower lobe (series 4, image 3), indeterminate. Minimal subsegmental atelectasis present at the left lung base the. Visualized lung bases are otherwise clear. Hepatobiliary: Liver demonstrates a normal contrast enhanced appearance. Gallbladder surgically absent. Common bile duct dilated up to a 17 mm, relatively similar to previous. Associated intrahepatic biliary dilatation also similar. Pancreas: Pancreas atrophic without mass lesion or acute inflammatory changes. Spleen: Spleen within normal limits. Adrenals/Urinary Tract: Adrenal glands within normal limits. Kidneys equal in size with symmetric enhancement. Scattered cortical thinning and scarring noted bilaterally. 2.6 cm cyst present at the lower pole of the left kidney. Additional subcentimeter hypodensities within left kidney too small the characterize, but statistically likely reflects small cysts as well. No nephrolithiasis, hydronephrosis, or focal enhancing renal mass. No hydroureter. Bladder largely decompressed. Mild circumferential wall thickening like related incomplete distension. Cast lucency within the bladder lumen may be related to recent catheterization. Acute infection/cystitis could also be considered. Stomach/Bowel: Small hiatal hernia noted. Stomach otherwise unremarkable. No evidence for bowel obstruction. Appendix is absent. Sigmoid diverticulosis without evidence for acute diverticulitis. No  acute inflammatory changes seen about the bowels. Vascular/Lymphatic: Advanced aorto bi-iliac atherosclerotic disease. No aneurysm. Normal intravascular enhancement seen throughout the intra-abdominal aorta and its branch vessels. No adenopathy. Reproductive: Uterus is surgically absent. Ovaries not discretely identified. Other: No free air or fluid. Musculoskeletal: No acute osseous abnormality. No worrisome lytic or blastic osseous lesions. IMPRESSION: 1. Intraluminal gas within the urinary bladder, suspected to be related to recent instrumentation. Acute cystitis could also be considered in the correct clinical setting. Correlation with urinalysis recommended. 2. Status post cholecystectomy with prominent intra and extrahepatic biliary dilatation, similar to previous. 3. Sigmoid diverticulosis without evidence for acute diverticulitis. 4. Advanced aorto bi-iliac atherosclerotic disease without aneurysm. 5. Small hiatal hernia. Electronically Signed   By: Jeannine Boga M.D.   On: 04/30/2017 21:27   ASSESSMENT AND PLAN:   This is a 83 y.o. female with a history of Chronic pain, GERD, hypertension, hyperlipidemia, non--insulin dependent diabetes, CK D3, osteoarthritis, Alzheimer's dementia now being admitted with:  #. UTI with leukocytosis. discontinue Zosyn, change to cipro due to previous urine culture showing Pseudomonas and Citrobacter, both sensitive to Cipro. -Follow-up CBC and cultures.  #. Hyponatremia, mild -ContinueNS  IV.  #. History of hypertension -Continue Avapro  #. History of history of hyperlipidemia - Continue Lipitor  #. History of dementia - Continue Namenda  #. History of GERD - Continue Prilosec  #. History of urinary incontinence - Continue oxybutynin  #. History of diabetes - Continue 70/30 -Accu-Cheks before meals and at bedtime with regular insulin sliding scale coverage  Generalized weakness. PT evaluation. All the records are reviewed and  case discussed with Care Management/Social Worker. Management plans discussed with the patient, family and they are  in agreement.  CODE STATUS: Full Code  TOTAL TIME TAKING CARE OF THIS PATIENT: 35 minutes.   More than 50% of the time was spent in counseling/coordination of care: YES  POSSIBLE D/C IN 1-2 DAYS, DEPENDING ON CLINICAL CONDITION.   Demetrios Loll M.D on 05/01/2017 at 12:25 PM  Between 7am to 6pm - Pager - [phone]  After 6pm go to [email or web address] - Proofreader  [facility]  Office  [phone]  CC: Primary care physician; Glendon Axe, MD  Note: This dictation was prepared with Dragon dictation along with smaller phrase technology. Any transcriptional errors that result from this process are unintentional.

## 2017-05-01 NOTE — Progress Notes (Signed)
[facility] received an OR to come to room 150 to pray with PT. Upon arriving to the room [place] talked to nurse and found that she was concerned because the PT was crying thinking about her husband who had passed last year. [place] prayed with PT.    05/01/17 1725  Clinical Encounter Type  Visited With Patient and family together  Visit Type Initial  Referral From Nurse  Consult/Referral To Chaplain  Spiritual Encounters  Spiritual Needs Prayer

## 2017-05-01 NOTE — ED Notes (Signed)
Bed found ready att 

## 2017-05-01 NOTE — Progress Notes (Signed)
Pt called for RN. Pt frantic, stating that "her arm is itching and burning ever since starting the antibiotic". Stopped infusion. IV appears slightly red near insertion site. Notified MD. Orders to D/C cipro, resume zosyn. Notified AC Cheryl to assess for another IV site as this is the 2nd one we have placed today.

## 2017-05-01 NOTE — Progress Notes (Signed)
Anticoagulation monitoring(Lovenox):  83yo  F ordered Lovenox 30 mg Q24h  Filed Weights   04/30/17 1928 05/01/17 0124  Weight: 136 lb 14.5 oz (62.1 kg) 137 lb 11.2 oz (62.5 kg)   BMI 32.1   Lab Results  Component Value Date   CREATININE 0.94 05/01/2017   CREATININE 1.02 (H) 04/30/2017   CREATININE 1.06 (H) 12/17/2016   Estimated Creatinine Clearance: 30.8 mL/min (by C-G formula based on SCr of 0.94 mg/dL). Hemoglobin & Hematocrit     Component Value Date/Time   HGB 12.5 05/01/2017 0434   HGB 10.9 (L) 12/02/2014 1347   HCT 37.2 05/01/2017 0434   HCT 32.5 (L) 12/02/2014 1347     Per Protocol for Patient with estCrcl > 30 ml/min and BMI < 40, will transition to Lovenox 40 mg Q24h      Chinita Greenland PharmD Clinical Pharmacist 05/01/2017

## 2017-05-01 NOTE — Progress Notes (Signed)
Paged MD, pt requesting something for anxiety. Saying she is "at her wits end with being sick and in pain". Xanax 0.25mg  PRN TID ordered per Dr. Bridgett Larsson. Chaplain also requested to sit and talk with patient.

## 2017-05-01 NOTE — Evaluation (Signed)
Physical Therapy Evaluation Patient Details Name: Cindy Sullivan MRN: [id number] DOB: [birth year] Today's Date: 05/01/2017   History of Present Illness  Cindy Sullivan is a 83 y.o. female with a known history of Chronic pain, GERD, hypertension, hyperlipidemia, non--insulin dependent diabetes, CK D3, osteoarthritis, Alzheimer's dementia presents to the emergency department for evaluation of body aches. Patient was diagnosed with SIRS due to UTI;   Clinical Impression  83 yo Female presents with full body pain. Patient reports needing assistance for most ADLs prior to admittance. Her brother and niece would take turns caring for her. She also had a home health aide 2x a week who assisted with bathing. Patient currently requires min A for bed mobility with elevated head of bed. She requires min A for sit<>Stand transfer from various height surfaces with cues to improve push through LE and proper hand placement. She was able to walk 15 feet with RW, supervision with cues to stay close to RW and to increase step length for better gait safety.She exhibits increased weakness in BLE grossly 3+/5 and reports pain with most movement. She would benefit from skilled PT intervention to improve mobility and strength. Patient would benefit from home health PT to improve functional mobility and safety in the home.     Follow Up Recommendations Home health PT;Supervision/Assistance - 24 hour    Equipment Recommendations  None recommended by PT    Recommendations for Other Services       Precautions / Restrictions Precautions Precautions: Fall Restrictions Weight Bearing Restrictions: No      Mobility  Bed Mobility Overal bed mobility: Needs Assistance Bed Mobility: Supine to Sit     Supine to sit: Min assist;HOB elevated     General bed mobility comments: with bed rail; required cues for hand placement and to increase scoot forward; requires increased time;   Transfers Overall transfer level: Needs  assistance Equipment used: Rolling walker (2 wheeled) Transfers: Sit to/from Stand Sit to Stand: Min assist         General transfer comment: sit<>Stand from bed and commode with RW, min A with cues to increase push through LE;   Ambulation/Gait Ambulation/Gait assistance: Supervision Ambulation Distance (Feet): 15 Feet Assistive device: Rolling walker (2 wheeled) Gait Pattern/deviations: Step-through pattern;Decreased step length - right;Decreased step length - left;Decreased dorsiflexion - right;Decreased dorsiflexion - left;Wide base of support;Trunk flexed Gait velocity: decreased   General Gait Details: decreased speed, short shuffled steps; requires cues for positioning and to increase step length; Patient tends to put the walker out in front and requires cues to keep walker close to self;   Stairs            Wheelchair Mobility    Modified Rankin (Stroke Patients Only)       Balance Overall balance assessment: Needs assistance Sitting-balance support: Feet unsupported;Bilateral upper extremity supported Sitting balance-Leahy Scale: Fair Sitting balance - Comments: can be impulsive with increased pain; able to keep balance with supervision;    Standing balance support: Bilateral upper extremity supported Standing balance-Leahy Scale: Fair Standing balance comment: requires supervision during gait tasks;                              Pertinent Vitals/Pain Pain Assessment: 0-10 Pain Score: 8  Pain Location: full body ache; RN giving meds at start of evaluation;  Pain Descriptors / Indicators: Aching;Spasm;Sore;Sharp Pain Intervention(s): Limited activity within patient's tolerance;Monitored during session;Repositioned;RN gave pain meds during session  Home Living Family/patient expects to be discharged to:: Private residence Living Arrangements: Other relatives (brother and niece take turns staying with her; has 24/7 supervision; ) Available Help  at Discharge: Family Type of Home: House Home Access: Stairs to enter Entrance Stairs-Rails: Right;Left;Can reach both Entrance Stairs-Number of Steps: 3 Home Layout: One level Home Equipment: Environmental consultant - 2 wheels;Shower seat      Prior Function Level of Independence: Needs assistance   Gait / Transfers Assistance Needed: household distances only with RW;   ADL's / Homemaking Assistance Needed: has home health aide 2x a week help with bathing; brother and niece assist with cooking and cleaning;         Hand Dominance        Extremity/Trunk Assessment   Upper Extremity Assessment Upper Extremity Assessment: Overall WFL for tasks assessed    Lower Extremity Assessment Lower Extremity Assessment: RLE deficits/detail;LLE deficits/detail RLE Deficits / Details: grossly 3+/5 strength, ROM is WFL; intact light touch sensation;  LLE Deficits / Details: grossly 3+/5 strength, ROM is WFL; intact light touch sensation;        Communication   Communication: HOH  Cognition Arousal/Alertness: Awake/alert Behavior During Therapy: WFL for tasks assessed/performed Overall Cognitive Status: History of cognitive impairments - at baseline                                 General Comments: oriented to person, place; has dementia;       General Comments      Exercises     Assessment/Plan    PT Assessment Patient needs continued PT services  PT Problem List Decreased strength;Decreased mobility;Decreased safety awareness;Decreased activity tolerance;Decreased balance;Pain       PT Treatment Interventions Gait training;Stair training;Functional mobility training;Neuromuscular re-education;Balance training;Therapeutic exercise;Therapeutic activities;Patient/family education    PT Goals (Current goals can be found in the Care Plan section)  Acute Rehab PT Goals Patient Stated Goal: "I want to get better and go home."  PT Goal Formulation: With patient Time For Goal  Achievement: 05/15/17 Potential to Achieve Goals: Good    Frequency Min 2X/week   Barriers to discharge Inaccessible home environment has 3 steps to enter;     Co-evaluation               AM-PAC PT "6 Clicks" Daily Activity  Outcome Measure Difficulty turning over in bed (including adjusting bedclothes, sheets and blankets)?: A Lot Difficulty moving from lying on back to sitting on the side of the bed? : Total Difficulty sitting down on and standing up from a chair with arms (e.g., wheelchair, bedside commode, etc,.)?: Total Help needed moving to and from a bed to chair (including a wheelchair)?: A Little Help needed walking in hospital room?: A Little Help needed climbing 3-5 steps with a railing? : A Lot 6 Click Score: 12    End of Session Equipment Utilized During Treatment: Gait belt Activity Tolerance: Patient tolerated treatment well;Patient limited by pain Patient left: in chair;with call bell/phone within reach;with family/visitor present;with chair alarm set Nurse Communication: Mobility status PT Visit Diagnosis: Unsteadiness on feet (R26.81);Muscle weakness (generalized) (M62.81)    Time: 5701-7793 PT Time Calculation (min) (ACUTE ONLY): 34 min   Charges:   PT Evaluation $PT Eval Low Complexity: 1 Procedure     PT G Codes:   PT G-Codes **NOT FOR INPATIENT CLASS** Functional Assessment Tool Used: AM-PAC 6 Clicks Basic Mobility;Clinical judgement Functional Limitation: Mobility: Walking  and moving around Mobility: Walking and Moving Around Current Status [phone]): At least 60 percent but less than 80 percent impaired, limited or restricted Mobility: Walking and Moving Around Goal Status [phone]): At least 40 percent but less than 60 percent impaired, limited or restricted    Keelon Zurn PT, DPT 05/01/2017, 3:00 PM

## 2017-05-01 NOTE — Care Management Obs Status (Signed)
[facility] NOTIFICATION   Patient Details  Name: LEEONA MCCARDLE MRN: [id number] Date of Birth: 06/16/1929   Medicare Observation Status Notification Given:  Yes [facility] letter)    Mardene Speak, RN 05/01/2017, 12:39 PM

## 2017-05-01 NOTE — Progress Notes (Signed)
Pharmacy Antibiotic Note  Cindy Sullivan is a 83 y.o. female admitted on 04/30/2017 with UTI.  Pharmacy has been consulted for zosyn dosing.  Plan: Patient received zosyn 3.375g IV x 1 in ED  Will continue zosyn 3.375g IV q8h extended infusion dosing. Patient has a h/o pseudomonas resistant UTI -- follow up on cultures/sensitivities to tailor abx therapy.  Height: 4\' 7"  (139.7 cm) Weight: 137 lb 11.2 oz (62.5 kg) IBW/kg (Calculated) : 34  Temp (24hrs), Avg:98 F (36.7 C), Min:97.6 F (36.4 C), Max:98.3 F (36.8 C)   Recent Labs Lab 04/30/17 1951 04/30/17 2300  WBC 14.8*  --   CREATININE 1.02*  --   LATICACIDVEN 2.2* 1.7    Estimated Creatinine Clearance: 28.4 mL/min (A) (by C-G formula based on SCr of 1.02 mg/dL (H)).    Allergies  Allergen Reactions  . Codeine Other (See Comments)    Other reaction(s): Headache Reaction:  Headaches   . Escitalopram Oxalate     Other reaction(s): Other (See Comments) Agitation  . Metronidazole Nausea Only  . Other     Other reaction(s): Unknown anti- Inflammatory and bee stings  . Pantoprazole Nausea Only  . Phenergan [Promethazine Hcl] Other (See Comments)    Reaction:  Unknown   . Sulfamethoxazole-Trimethoprim     Other reaction(s): Unknown    Thank you for allowing pharmacy to be a part of this patient's care.  Tobie Lords, PharmD, BCPS Clinical Pharmacist 05/01/2017

## 2017-05-02 DIAGNOSIS — N390 Urinary tract infection, site not specified: Secondary | ICD-10-CM | POA: Diagnosis not present

## 2017-05-02 LAB — GLUCOSE, CAPILLARY: Glucose-Capillary: 151 mg/dL — ABNORMAL HIGH (ref 65–99)

## 2017-05-02 MED ORDER — AMOXICILLIN-POT CLAVULANATE 875-125 MG PO TABS: 1.0000 | ORAL_TABLET | Freq: Two times a day (BID) | ORAL | 0 refills | Status: AC

## 2017-05-02 NOTE — Discharge Summary (Signed)
[facility] NAME: Cindy Sullivan    MR#:  [id number]  DATE OF BIRTH:  23-Dec-1928  DATE OF ADMISSION:  04/30/2017   ADMITTING PHYSICIAN: Harvie Bridge, DO  DATE OF DISCHARGE: 05/02/2017 11:40 AM  PRIMARY CARE PHYSICIAN: Glendon Axe, MD   ADMISSION DIAGNOSIS:  Pain [R52] Urinary tract infection without hematuria, site unspecified [N39.0] DISCHARGE DIAGNOSIS:  Active Problems:   SIRS (systemic inflammatory response syndrome) (HCC) UTI with leukocytosis. SECONDARY DIAGNOSIS:   Past Medical History:  Diagnosis Date  . Chest pain syndrome   . Chronic back pain   . Chronic diarrhea   . GERD (gastroesophageal reflux disease)   . H/O: GI bleed   . Hyperlipidemia   . Hypertension   . IDA (iron deficiency anemia)   . Insomnia   . Non-insulin dependent type 2 diabetes mellitus ([place])   . Pancreatitis    HOSPITAL COURSE:   This is a 83 y.o.femalewith a history of Chronic pain, GERD, hypertension, hyperlipidemia, non--insulin dependent diabetes, CK D3, osteoarthritis, Alzheimer's dementianow being admitted with:  #. UTI with leukocytosis. discontinue Zosyn, change to cipro due to previous urine culture showing Pseudomonas and Citrobacter, both sensitive to Cipro. The patient was treated with 1 dose of Cipro but developed an allergy reaction. Cipro was discontinued and restarted Zosyn IV. Leukocytosis improved, urine culture shows Escherichia coli but sensitivities pending. Change to Keflex by mouth.  #. Hyponatremia, mild Improving with normal saline IV.  #. History of hypertension -Continue Avapro  #. History of history of hyperlipidemia - Continue Lipitor  #. History of dementia - Continue Namenda  #. History of GERD - Continue Prilosec  #. History of urinary incontinence - Continue oxybutynin  #. History of diabetes - Continue 70/30 -Accu-Cheks before meals and at bedtime with regular insulin  sliding scale coverage  Generalized weakness. PT evaluation: HHPT.  DISCHARGE CONDITIONS:  Stable discharged to home with home health and PT today. CONSULTS OBTAINED:   DRUG ALLERGIES:   Allergies  Allergen Reactions  . Ciprofloxacin Hives  . Codeine Other (See Comments)    Other reaction(s): Headache Reaction:  Headaches   . Escitalopram Oxalate     Other reaction(s): Other (See Comments) Agitation  . Metronidazole Nausea Only  . Other     Other reaction(s): Unknown anti- Inflammatory and bee stings  . Pantoprazole Nausea Only  . Phenergan [Promethazine Hcl] Other (See Comments)    Reaction:  Unknown   . Sulfamethoxazole-Trimethoprim     Other reaction(s): Unknown   DISCHARGE MEDICATIONS:   Allergies as of 05/02/2017      Reactions   Ciprofloxacin Hives   Codeine Other (See Comments)   Other reaction(s): Headache Reaction:  Headaches    Escitalopram Oxalate    Other reaction(s): Other (See Comments) Agitation   Metronidazole Nausea Only   Other    Other reaction(s): Unknown anti- Inflammatory and bee stings   Pantoprazole Nausea Only   Phenergan [promethazine Hcl] Other (See Comments)   Reaction:  Unknown    Sulfamethoxazole-trimethoprim    Other reaction(s): Unknown      Medication List    TAKE these medications   acetaminophen 500 MG tablet Commonly known as:  TYLENOL Take 500 mg by mouth 2 (two) times daily.   amoxicillin-clavulanate 875-125 MG tablet Commonly known as:  AUGMENTIN Take 1 tablet by mouth every 12 (twelve) hours.   atorvastatin 20 MG tablet Commonly known as:  LIPITOR Take 20 mg by mouth at  bedtime.   insulin regular 100 units/mL injection Commonly known as:  NOVOLIN R,HUMULIN R Inject 2-10 Units into the skin 2 (two) times daily before a meal. Pt uses as needed per sliding scale:    151-200:  2 units  201-250:  4 units 251-300:  6 units  301-350:  8 units 351-400:  10 units   irbesartan 150 MG tablet Commonly known as:   AVAPRO Take 150 mg by mouth daily.   memantine 5 MG tablet Commonly known as:  NAMENDA Take 5 mg by mouth 2 (two) times daily.   NOVOLIN 70/30 (70-30) 100 UNIT/ML injection Generic drug:  insulin NPH-regular Human Inject 3-25 Units into the skin 2 (two) times daily. Pt uses 20-25 units in the morning. and 3-5 units in the evening.   omeprazole 40 MG capsule Commonly known as:  PRILOSEC Take 40 mg by mouth daily.   oxybutynin 5 MG 24 hr tablet Commonly known as:  DITROPAN-XL Take 5 mg by mouth 2 (two) times daily.   traMADol 50 MG tablet Commonly known as:  ULTRAM Take 1 tablet (50 mg total) by mouth every 6 (six) hours as needed for moderate pain.        DISCHARGE INSTRUCTIONS:  See AVS.  If you experience worsening of your admission symptoms, develop shortness of breath, life threatening emergency, suicidal or homicidal thoughts you must seek medical attention immediately by calling 911 or calling your MD immediately  if symptoms less severe.  You Must read complete instructions/literature along with all the possible adverse reactions/side effects for all the Medicines you take and that have been prescribed to you. Take any new Medicines after you have completely understood and accpet all the possible adverse reactions/side effects.   Please note  You were cared for by a hospitalist during your hospital stay. If you have any questions about your discharge medications or the care you received while you were in the hospital after you are discharged, you can call the unit and asked to speak with the hospitalist on call if the hospitalist that took care of you is not available. Once you are discharged, your primary care physician will handle any further medical issues. Please note that NO REFILLS for any discharge medications will be authorized once you are discharged, as it is imperative that you return to your primary care physician (or establish a relationship with a primary care  physician if you do not have one) for your aftercare needs so that they can reassess your need for medications and monitor your lab values.    On the day of Discharge:  VITAL SIGNS:  Blood pressure 128/78, pulse 68, temperature 98.2 F (36.8 C), temperature source Axillary, resp. rate 20, height 4\' 7"  (1.397 m), weight 137 lb 11.2 oz (62.5 kg), SpO2 98 %. PHYSICAL EXAMINATION:  GENERAL:  82 y.o.-year-old patient lying in the bed with no acute distress.  EYES: Pupils equal, round, reactive to light and accommodation. No scleral icterus. Extraocular muscles intact.  HEENT: Head atraumatic, normocephalic. Oropharynx and nasopharynx clear.  NECK:  Supple, no jugular venous distention. No thyroid enlargement, no tenderness.  LUNGS: Normal breath sounds bilaterally, no wheezing, rales,rhonchi or crepitation. No use of accessory muscles of respiration.  CARDIOVASCULAR: S1, S2 normal. No murmurs, rubs, or gallops.  ABDOMEN: Soft, non-tender, non-distended. Bowel sounds present. No organomegaly or mass.  EXTREMITIES: No pedal edema, cyanosis, or clubbing.  NEUROLOGIC: Cranial nerves II through XII are intact. Muscle strength 5/5 in all extremities. Sensation intact.  Gait not checked.  PSYCHIATRIC: The patient is alert and oriented x 3.  SKIN: No obvious rash, lesion, or ulcer.  DATA REVIEW:   CBC  Recent Labs Lab 05/01/17 0434  WBC 12.7*  HGB 12.5  HCT 37.2  PLT 353    Chemistries   Recent Labs Lab 04/30/17 1951 05/01/17 0434  NA 129* 133*  K 4.1 4.1  CL 95* 102  CO2 21* 25  GLUCOSE 178* 186*  BUN 17 14  CREATININE 1.02* 0.94  CALCIUM 9.2 8.7*  AST 24  --   ALT 9*  --   ALKPHOS 87  --   BILITOT 0.5  --      Microbiology Results  Results for orders placed or performed during the hospital encounter of 04/30/17  Urine culture     Status: Abnormal (Preliminary result)   Collection Time: 04/30/17  7:51 PM  Result Value Ref Range Status   Specimen Description URINE, RANDOM   Final   Special Requests NONE  Final   Culture (A)  Final    >=100,000 COLONIES/mL ESCHERICHIA COLI SUSCEPTIBILITIES TO FOLLOW Performed at [facility], [place] [address] [place], [place] [address]    Report Status PENDING  Incomplete  Culture, blood (routine x 2)     Status: None (Preliminary result)   Collection Time: 04/30/17  9:27 PM  Result Value Ref Range Status   Specimen Description BLOOD LEFT HAND  Final   Special Requests   Final    BOTTLES DRAWN AEROBIC AND ANAEROBIC Blood Culture adequate volume   Culture NO GROWTH 2 DAYS  Final   Report Status PENDING  Incomplete  Culture, blood (routine x 2)     Status: None (Preliminary result)   Collection Time: 04/30/17  9:27 PM  Result Value Ref Range Status   Specimen Description BLOOD LEFT FOREARM  Final   Special Requests   Final    BOTTLES DRAWN AEROBIC AND ANAEROBIC Blood Culture adequate volume   Culture NO GROWTH 2 DAYS  Final   Report Status PENDING  Incomplete    RADIOLOGY:  No results found.   Management plans discussed with the patient, family and they are in agreement.  CODE STATUS: Full Code   TOTAL TIME TAKING CARE OF THIS PATIENT: 27 minutes.    Demetrios Loll M.D on 05/02/2017 at 2:42 PM  Between 7am to 6pm - Pager - [phone]  After 6pm go to [email or web address] - Proofreader  [facility]  Office  [phone]  CC: Primary care physician; Glendon Axe, MD   Note: This dictation was prepared with Dragon dictation along with smaller phrase technology. Any transcriptional errors that result from this process are unintentional.

## 2017-05-02 NOTE — Discharge Instructions (Signed)
Heart healthy and ADA diet. HHPT Fall precaution.

## 2017-05-02 NOTE — Care Management Note (Signed)
Case Management Note  Patient Details  Name: Cindy Sullivan MRN: [id number] Date of Birth: June 27, 1929  Subjective/Objective:   RNCM consult for discharge planning. No family in room. Patient has dementia. TC to brother, Sallyanne Havers [phone]). He states patient lives alone but has family in the home around the clock to care for her. She has a home health aide through [facility] 2 days a week, Tuesday and Friday that comes in and bathes patient. Family cares for patient and the house. Discussed PT recommendations. Eddie Dibbles is agreeable to home health and prefers [facility]. Referral to Sarah with [facility] for [facility] PT. Patient uses a walker. PCP is Dr. Neldon Mc. She has been seen in the past 6 months.                  Action/Plan: HHPT with [facility]. No DME needs. No home O2. Discharging today  Expected Discharge Date:  05/02/17               Expected Discharge Plan:  [place]  In-House Referral:     Discharge planning Services  CM Consult  Post Acute Care Choice:  Home Health Choice offered to:  Sibling  DME Arranged:    DME Agency:     HH Arranged:  PT HH Agency:  [facility]  Status of Service:  Completed, signed off  If discussed at [place] of Stay Meetings, dates discussed:    Additional Comments:  Jolly Mango, RN 05/02/2017, 8:44 AM

## 2017-05-02 NOTE — Progress Notes (Signed)
Discharge orders and medication details reviewed with patient and family member (brother). All questions answered.  Printed AVS and printed prescription for Augmentin given to patient. Patient escorted out via wheelchair.   Wynema Birch, RN

## 2017-05-04 LAB — URINE CULTURE: Culture: >=100000 — AB

## 2017-05-05 LAB — CULTURE, BLOOD (ROUTINE X 2)
CULTURE: NO GROWTH
CULTURE: NO GROWTH
SPECIAL REQUESTS: ADEQUATE
SPECIAL REQUESTS: ADEQUATE

## 2017-05-09 ENCOUNTER — Inpatient Hospital Stay: Payer: Medicare Other

## 2017-05-16 ENCOUNTER — Inpatient Hospital Stay: Payer: Medicare Other

## 2017-05-20 ENCOUNTER — Inpatient Hospital Stay: Payer: Medicare Other

## 2017-05-23 ENCOUNTER — Inpatient Hospital Stay: Payer: Medicare Other | Attending: Internal Medicine

## 2017-05-23 DIAGNOSIS — Z452 Encounter for adjustment and management of vascular access device: Secondary | ICD-10-CM | POA: Insufficient documentation

## 2017-05-23 DIAGNOSIS — D500 Iron deficiency anemia secondary to blood loss (chronic): Secondary | ICD-10-CM | POA: Insufficient documentation

## 2017-05-23 DIAGNOSIS — Z95828 Presence of other vascular implants and grafts: Secondary | ICD-10-CM

## 2017-05-23 MED ORDER — HEPARIN SOD (PORK) LOCK FLUSH 100 UNIT/ML IV SOLN
500.0000 [IU] | Freq: Once | INTRAVENOUS | Status: AC
  Administered 2017-05-23: 500 [IU] via INTRAVENOUS

## 2017-05-23 MED ORDER — SODIUM CHLORIDE 0.9% FLUSH
10.0000 mL | INTRAVENOUS | Status: DC | PRN
  Administered 2017-05-23: 10 mL via INTRAVENOUS
  Filled 2017-05-23: qty 10

## 2017-06-27 ENCOUNTER — Encounter: Payer: Self-pay | Admitting: Emergency Medicine

## 2017-06-27 ENCOUNTER — Inpatient Hospital Stay: Payer: Medicare Other

## 2017-06-27 ENCOUNTER — Inpatient Hospital Stay
Admission: EM | Admit: 2017-06-27 | Discharge: 2017-06-29 | DRG: 872 | Disposition: A | Discharge status: Home Health | Payer: Medicare Other | Attending: Internal Medicine | Admitting: Internal Medicine

## 2017-06-27 DIAGNOSIS — Z885 Allergy status to narcotic agent status: Secondary | ICD-10-CM

## 2017-06-27 DIAGNOSIS — Z90710 Acquired absence of both cervix and uterus: Secondary | ICD-10-CM | POA: Diagnosis not present

## 2017-06-27 DIAGNOSIS — Z79899 Other long term (current) drug therapy: Secondary | ICD-10-CM | POA: Diagnosis not present

## 2017-06-27 DIAGNOSIS — M549 Dorsalgia, unspecified: Secondary | ICD-10-CM

## 2017-06-27 DIAGNOSIS — E871 Hypo-osmolality and hyponatremia: Secondary | ICD-10-CM | POA: Diagnosis present

## 2017-06-27 DIAGNOSIS — Z888 Allergy status to other drugs, medicaments and biological substances status: Secondary | ICD-10-CM | POA: Diagnosis not present

## 2017-06-27 DIAGNOSIS — N12 Tubulo-interstitial nephritis, not specified as acute or chronic: Secondary | ICD-10-CM | POA: Diagnosis present

## 2017-06-27 DIAGNOSIS — G8929 Other chronic pain: Secondary | ICD-10-CM | POA: Diagnosis present

## 2017-06-27 DIAGNOSIS — M170 Bilateral primary osteoarthritis of knee: Secondary | ICD-10-CM | POA: Diagnosis present

## 2017-06-27 DIAGNOSIS — Z881 Allergy status to other antibiotic agents status: Secondary | ICD-10-CM | POA: Diagnosis not present

## 2017-06-27 DIAGNOSIS — Z882 Allergy status to sulfonamides status: Secondary | ICD-10-CM

## 2017-06-27 DIAGNOSIS — E119 Type 2 diabetes mellitus without complications: Secondary | ICD-10-CM | POA: Diagnosis present

## 2017-06-27 DIAGNOSIS — Z794 Long term (current) use of insulin: Secondary | ICD-10-CM

## 2017-06-27 DIAGNOSIS — K219 Gastro-esophageal reflux disease without esophagitis: Secondary | ICD-10-CM | POA: Diagnosis present

## 2017-06-27 DIAGNOSIS — E785 Hyperlipidemia, unspecified: Secondary | ICD-10-CM | POA: Diagnosis present

## 2017-06-27 DIAGNOSIS — G301 Alzheimer's disease with late onset: Secondary | ICD-10-CM | POA: Diagnosis present

## 2017-06-27 DIAGNOSIS — F0280 Dementia in other diseases classified elsewhere without behavioral disturbance: Secondary | ICD-10-CM | POA: Diagnosis present

## 2017-06-27 DIAGNOSIS — Z87440 Personal history of urinary (tract) infections: Secondary | ICD-10-CM

## 2017-06-27 DIAGNOSIS — I10 Essential (primary) hypertension: Secondary | ICD-10-CM | POA: Diagnosis present

## 2017-06-27 DIAGNOSIS — A419 Sepsis, unspecified organism: Secondary | ICD-10-CM | POA: Diagnosis present

## 2017-06-27 LAB — COMPREHENSIVE METABOLIC PANEL
ALBUMIN: 3.7 g/dL (ref 3.5–5.0)
ALK PHOS: 88 U/L (ref 38–126)
ALT: 9 U/L — ABNORMAL LOW (ref 14–54)
ANION GAP: 11 (ref 5–15)
AST: 26 U/L (ref 15–41)
BUN: 14 mg/dL (ref 6–20)
CALCIUM: 9.2 mg/dL (ref 8.9–10.3)
CO2: 26 mmol/L (ref 22–32)
Chloride: 92 mmol/L — ABNORMAL LOW (ref 101–111)
Creatinine, Ser: 0.92 mg/dL (ref 0.44–1.00)
GFR calc Af Amer: >60 mL/min (ref >60)
GFR calc non Af Amer: 55 mL/min — ABNORMAL LOW (ref >60)
GLUCOSE: 128 mg/dL — AB (ref 65–99)
POTASSIUM: 4.5 mmol/L (ref 3.5–5.1)
SODIUM: 129 mmol/L — AB (ref 135–145)
Total Bilirubin: 0.6 mg/dL (ref 0.3–1.2)
Total Protein: 7.9 g/dL (ref 6.5–8.1)

## 2017-06-27 LAB — URINALYSIS, COMPLETE (UACMP) WITH MICROSCOPIC
BILIRUBIN URINE: NEGATIVE
GLUCOSE, UA: NEGATIVE mg/dL
KETONES UR: NEGATIVE mg/dL
NITRITE: NEGATIVE
PH: 6 (ref 5.0–8.0)
Protein, ur: NEGATIVE mg/dL
Specific Gravity, Urine: 1.009 (ref 1.005–1.030)
Squamous Epithelial / LPF: NONE SEEN

## 2017-06-27 LAB — CBC WITH DIFFERENTIAL/PLATELET
Basophils Absolute: 0.1 10*3/uL (ref 0–0.1)
Basophils Relative: 1 %
Eosinophils Absolute: 0 10*3/uL (ref 0–0.7)
Eosinophils Relative: 0 %
HEMATOCRIT: 39.8 % (ref 35.0–47.0)
Hemoglobin: 13.1 g/dL (ref 12.0–16.0)
LYMPHS PCT: 6 %
Lymphs Abs: 1.3 10*3/uL (ref 1.0–3.6)
MCH: 27.8 pg (ref 26.0–34.0)
MCHC: 33 g/dL (ref 32.0–36.0)
MCV: 84.3 fL (ref 80.0–100.0)
MONOS PCT: 8 %
Monocytes Absolute: 1.8 10*3/uL — ABNORMAL HIGH (ref 0.2–0.9)
NEUTROS ABS: 18.8 10*3/uL — AB (ref 1.4–6.5)
Neutrophils Relative %: 85 %
Platelets: 453 10*3/uL — ABNORMAL HIGH (ref 150–440)
RBC: 4.72 MIL/uL (ref 3.80–5.20)
RDW: 14.6 % — ABNORMAL HIGH (ref 11.5–14.5)
WBC: 22 10*3/uL — ABNORMAL HIGH (ref 3.6–11.0)

## 2017-06-27 LAB — GLUCOSE, CAPILLARY: Glucose-Capillary: 148 mg/dL — ABNORMAL HIGH (ref 65–99)

## 2017-06-27 LAB — LACTIC ACID, PLASMA
Lactic Acid, Venous: 2.2 mmol/L (ref 0.5–1.9)
Lactic Acid, Venous: 2.7 mmol/L (ref 0.5–1.9)

## 2017-06-27 MED ORDER — ACETAMINOPHEN 650 MG RE SUPP: 650.0000 mg | Freq: Four times a day (QID) | RECTAL | Status: DC | PRN

## 2017-06-27 MED ORDER — ENOXAPARIN SODIUM 40 MG/0.4ML ~~LOC~~ SOLN
40.0000 mg | SUBCUTANEOUS | Status: DC
  Administered 2017-06-27 – 2017-06-28 (×2): 40 mg via SUBCUTANEOUS
  Filled 2017-06-27 (×2): qty 0.4

## 2017-06-27 MED ORDER — SODIUM CHLORIDE 0.9 % IV BOLUS (SEPSIS)
1000.0000 mL | Freq: Once | INTRAVENOUS | Status: AC
  Administered 2017-06-27: 1000 mL via INTRAVENOUS

## 2017-06-27 MED ORDER — ONDANSETRON HCL 4 MG/2ML IJ SOLN
4.0000 mg | Freq: Once | INTRAMUSCULAR | Status: AC
  Administered 2017-06-27: 4 mg via INTRAVENOUS
  Filled 2017-06-27: qty 2

## 2017-06-27 MED ORDER — TRAMADOL HCL 50 MG PO TABS
50.0000 mg | ORAL_TABLET | Freq: Four times a day (QID) | ORAL | Status: DC | PRN
  Administered 2017-06-27 – 2017-06-29 (×5): 50 mg via ORAL
  Filled 2017-06-27 (×4): qty 1

## 2017-06-27 MED ORDER — MEMANTINE HCL 5 MG PO TABS
5.0000 mg | ORAL_TABLET | Freq: Two times a day (BID) | ORAL | Status: DC
  Administered 2017-06-27 – 2017-06-29 (×4): 5 mg via ORAL
  Filled 2017-06-27 (×4): qty 1

## 2017-06-27 MED ORDER — BISACODYL 5 MG PO TBEC: 5.0000 mg | DELAYED_RELEASE_TABLET | Freq: Every day | ORAL | Status: DC | PRN

## 2017-06-27 MED ORDER — FENTANYL CITRATE (PF) 100 MCG/2ML IJ SOLN
50.0000 ug | Freq: Once | INTRAMUSCULAR | Status: AC
Start: 2017-06-27 — End: 2017-06-27
  Administered 2017-06-27: 50 ug via INTRAVENOUS
  Filled 2017-06-27: qty 2

## 2017-06-27 MED ORDER — DOCUSATE SODIUM 100 MG PO CAPS
100.0000 mg | ORAL_CAPSULE | Freq: Two times a day (BID) | ORAL | Status: DC
  Administered 2017-06-28 – 2017-06-29 (×3): 100 mg via ORAL
  Filled 2017-06-27 (×4): qty 1

## 2017-06-27 MED ORDER — SODIUM CHLORIDE 0.9 % IV SOLN
INTRAVENOUS | Status: DC
  Administered 2017-06-27 – 2017-06-28 (×2): via INTRAVENOUS

## 2017-06-27 MED ORDER — INSULIN ASPART 100 UNIT/ML ~~LOC~~ SOLN
0.0000 [IU] | Freq: Three times a day (TID) | SUBCUTANEOUS | Status: DC
  Administered 2017-06-28 (×2): 2 [IU] via SUBCUTANEOUS
  Administered 2017-06-28: 1 [IU] via SUBCUTANEOUS
  Administered 2017-06-29: 2 [IU] via SUBCUTANEOUS
  Filled 2017-06-27 (×4): qty 1

## 2017-06-27 MED ORDER — LORAZEPAM 2 MG/ML IJ SOLN
0.5000 mg | Freq: Once | INTRAMUSCULAR | Status: AC
  Administered 2017-06-27: 0.5 mg via INTRAVENOUS
  Filled 2017-06-27: qty 1

## 2017-06-27 MED ORDER — ACETAMINOPHEN 500 MG PO TABS
500.0000 mg | ORAL_TABLET | Freq: Two times a day (BID) | ORAL | Status: DC
  Administered 2017-06-27 – 2017-06-29 (×4): 500 mg via ORAL
  Filled 2017-06-27 (×4): qty 1

## 2017-06-27 MED ORDER — VANCOMYCIN HCL 500 MG IV SOLR
500.0000 mg | INTRAVENOUS | Status: DC
  Filled 2017-06-27: qty 500

## 2017-06-27 MED ORDER — ONDANSETRON HCL 4 MG/2ML IJ SOLN: 4.0000 mg | Freq: Four times a day (QID) | INTRAMUSCULAR | Status: DC | PRN

## 2017-06-27 MED ORDER — PIPERACILLIN-TAZOBACTAM 3.375 G IVPB
3.3750 g | Freq: Three times a day (TID) | INTRAVENOUS | Status: DC
  Administered 2017-06-28 (×2): 3.375 g via INTRAVENOUS
  Filled 2017-06-27 (×2): qty 50

## 2017-06-27 MED ORDER — ACETAMINOPHEN 325 MG PO TABS
650.0000 mg | ORAL_TABLET | Freq: Four times a day (QID) | ORAL | Status: DC | PRN
  Administered 2017-06-28 – 2017-06-29 (×2): 650 mg via ORAL
  Filled 2017-06-27 (×2): qty 2

## 2017-06-27 MED ORDER — OXYBUTYNIN CHLORIDE ER 5 MG PO TB24
5.0000 mg | ORAL_TABLET | Freq: Two times a day (BID) | ORAL | Status: DC
  Administered 2017-06-27 – 2017-06-29 (×4): 5 mg via ORAL
  Filled 2017-06-27 (×4): qty 1

## 2017-06-27 MED ORDER — ATORVASTATIN CALCIUM 20 MG PO TABS
20.0000 mg | ORAL_TABLET | Freq: Every day | ORAL | Status: DC
  Administered 2017-06-27 – 2017-06-28 (×2): 20 mg via ORAL
  Filled 2017-06-27 (×2): qty 1

## 2017-06-27 MED ORDER — ONDANSETRON HCL 4 MG PO TABS: 4.0000 mg | ORAL_TABLET | Freq: Four times a day (QID) | ORAL | Status: DC | PRN

## 2017-06-27 MED ORDER — VANCOMYCIN HCL IN DEXTROSE 1-5 GM/200ML-% IV SOLN
1000.0000 mg | Freq: Once | INTRAVENOUS | Status: AC
  Administered 2017-06-27: 1000 mg via INTRAVENOUS

## 2017-06-27 MED ORDER — PIPERACILLIN-TAZOBACTAM 3.375 G IVPB 30 MIN
INTRAVENOUS | Status: AC
  Administered 2017-06-27: 3.375 g via INTRAVENOUS
  Filled 2017-06-27: qty 50

## 2017-06-27 MED ORDER — TRAMADOL HCL 50 MG PO TABS
ORAL_TABLET | ORAL | Status: AC
  Filled 2017-06-27: qty 1

## 2017-06-27 MED ORDER — IRBESARTAN 150 MG PO TABS
150.0000 mg | ORAL_TABLET | Freq: Every day | ORAL | Status: DC
  Administered 2017-06-28 – 2017-06-29 (×2): 150 mg via ORAL
  Filled 2017-06-27 (×2): qty 1

## 2017-06-27 MED ORDER — PIPERACILLIN-TAZOBACTAM 3.375 G IVPB 30 MIN
3.3750 g | Freq: Once | INTRAVENOUS | Status: AC
  Administered 2017-06-27: 3.375 g via INTRAVENOUS

## 2017-06-27 MED ORDER — TRAZODONE HCL 50 MG PO TABS
25.0000 mg | ORAL_TABLET | Freq: Every evening | ORAL | Status: DC | PRN
  Administered 2017-06-27: 25 mg via ORAL
  Filled 2017-06-27: qty 1

## 2017-06-27 NOTE — ED Provider Notes (Signed)
[facility] Emergency Department Provider Note  ____________________________________________  Time seen: Approximately 5:55 PM  I have reviewed the triage vital signs and the nursing notes.   HISTORY  Chief Complaint Back Pain    HPI Cindy Sullivan is a 83 y.o. female complains of acute worsening of her chronic low back pain that started at 3:00 AM today. Constant, moderate to severe intensity, sharp. Hurts across the bilateral lumbar back, not in the midline at the spine. Nonradiating. Worse with movement, better with Tylenol and tramadol that she took at 3:00 PM today.     Past Medical History:  Diagnosis Date  . Chest pain syndrome   . Chronic back pain   . Chronic diarrhea   . GERD (gastroesophageal reflux disease)   . H/O: GI bleed   . Hyperlipidemia   . Hypertension   . IDA (iron deficiency anemia)   . Insomnia   . Non-insulin dependent type 2 diabetes mellitus ([place])   . Pancreatitis      Patient Active Problem List   Diagnosis Date Noted  . SIRS (systemic inflammatory response syndrome) ([place]) 04/30/2017  . Type 2 diabetes mellitus ([place]) 01/18/2016  . Alzheimer's dementia, late onset 01/18/2016  . Iron deficiency anemia due to chronic blood loss 12/30/2015  . Chronic hyponatremia 04/07/2015  . Chronic kidney disease (CKD), stage III (moderate) 04/07/2015  . Essential (primary) hypertension 04/07/2015  . Chronic diarrhea 02/03/2015  . Insomnia, persistent 02/02/2015  . Primary osteoarthritis of both knees 02/02/2015  . Pure hypercholesterolemia 09/12/2014     Past Surgical History:  Procedure Laterality Date  . APPENDECTOMY    . CHOLECYSTECTOMY    . EXPLORATORY LAPAROTOMY    . HERNIA REPAIR     x 2  . KNEE ARTHROSCOPY Left   . TOTAL ABDOMINAL HYSTERECTOMY W/ BILATERAL SALPINGOOPHORECTOMY     one ovary still left     Prior to Admission medications   Medication Sig Start Date End Date Taking? Authorizing Provider   acetaminophen (TYLENOL) 500 MG tablet Take 500 mg by mouth 2 (two) times daily.    Yes [provider]  atorvastatin (LIPITOR) 20 MG tablet Take 20 mg by mouth at bedtime.    Yes [provider]  insulin NPH-regular Human (NOVOLIN 70/30) (70-30) 100 UNIT/ML injection Inject 3-25 Units into the skin 2 (two) times daily. Pt uses 20-25 units in the morning. and 3-5 units in the evening.   Yes [provider]  insulin regular (NOVOLIN R,HUMULIN R) 100 units/mL injection Inject 2-10 Units into the skin 2 (two) times daily before a meal. Pt uses as needed per sliding scale:    151-200:  2 units  201-250:  4 units 251-300:  6 units  301-350:  8 units 351-400:  10 units   Yes [provider]  irbesartan (AVAPRO) 150 MG tablet Take 150 mg by mouth daily.   Yes [provider]  memantine (NAMENDA) 5 MG tablet Take 5 mg by mouth 2 (two) times daily.    Yes [provider]  omeprazole (PRILOSEC) 40 MG capsule Take 40 mg by mouth daily.    Yes [provider]  oxybutynin (DITROPAN-XL) 5 MG 24 hr tablet Take 5 mg by mouth 2 (two) times daily.    Yes [provider]  traMADol (ULTRAM) 50 MG tablet Take 1 tablet (50 mg total) by mouth every 6 (six) hours as needed for moderate pain. 04/16/16  Yes Dustin Flock, MD  Allergies Ciprofloxacin; Codeine; Escitalopram oxalate; Metronidazole; Other; Pantoprazole; Phenergan [promethazine hcl]; and Sulfamethoxazole-trimethoprim   History reviewed. No pertinent family history.  Social History Social History  Substance Use Topics  . Smoking status: Never Smoker  . Smokeless tobacco: Never Used  . Alcohol use No    Review of Systems  Constitutional:   No fever or chills.  ENT:   No sore throat. No rhinorrhea. Cardiovascular:   No chest pain or syncope. Respiratory:   No dyspnea or cough. Gastrointestinal:   Negative for abdominal pain, vomiting and diarrhea.  Musculoskeletal:    Chronic back pain All other systems reviewed and are negative except as documented above in ROS and HPI.  ____________________________________________   PHYSICAL EXAM:  VITAL SIGNS: ED Triage Vitals  Enc Vitals Group     BP 06/27/17 1641 138/66     Pulse Rate 06/27/17 1641 (!) 112     Resp --      Temp 06/27/17 1641 (!) 101.1 F (38.4 C)     Temp Source 06/27/17 1641 Oral     SpO2 06/27/17 1641 92 %     Weight 06/27/17 1646 132 lb 4.4 oz (60 kg)     Height 06/27/17 1646 4\' 9"  (1.448 m)     Head Circumference --      Peak Flow --      Pain Score 06/27/17 1635 7     Pain Loc --      Pain Edu? --      Excl. in [place]? --     Vital signs reviewed, nursing assessments reviewed.   Constitutional:   Alert and oriented. Not in distress. Eyes:   No scleral icterus.  EOMI. No nystagmus. No conjunctival pallor. PERRL. ENT   Head:   Normocephalic and atraumatic.   Nose:   No congestion/rhinnorhea.    Mouth/Throat:   Dry mucous membranes, no pharyngeal erythema. No peritonsillar mass.    Neck:   No meningismus. Full ROM Hematological/Lymphatic/Immunilogical:   No cervical lymphadenopathy. Cardiovascular:   Tachycardia heart rate 110. Symmetric bilateral radial and DP pulses.  No murmurs.  Respiratory:   Normal respiratory effort without tachypnea/retractions. Breath sounds are clear and equal bilaterally. No wheezes/rales/rhonchi. Gastrointestinal:   Soft and nontender. Non distended. There is no CVA tenderness.  No rebound, rigidity, or guarding. Genitourinary:   deferred Musculoskeletal:   Normal range of motion in all extremities. No joint effusions.  No lower extremity tenderness.  No edema. Neurologic:   Normal speech and language.  Motor grossly intact. No gross focal neurologic deficits are appreciated.  Skin:    Skin is warm, dry and intact. No rash noted.  No petechiae, purpura, or bullae.  ____________________________________________    LABS (pertinent  positives/negatives) (all labs ordered are listed, but only abnormal results are displayed) Labs Reviewed  LACTIC ACID, PLASMA - Abnormal; Notable for the following:       Result Value   Lactic Acid, Venous 2.2 (*)    All other components within normal limits  COMPREHENSIVE METABOLIC PANEL - Abnormal; Notable for the following:    Sodium 129 (*)    Chloride 92 (*)    Glucose, Bld 128 (*)    ALT 9 (*)    GFR calc non Af Amer 55 (*)    All other components within normal limits  CBC WITH DIFFERENTIAL/PLATELET - Abnormal; Notable for the following:    WBC 22.0 (*)    RDW 14.6 (*)    Platelets 453 (*)  Neutro Abs 18.8 (*)    Monocytes Absolute 1.8 (*)    All other components within normal limits  URINALYSIS, COMPLETE (UACMP) WITH MICROSCOPIC - Abnormal; Notable for the following:    Color, Urine YELLOW (*)    APPearance CLOUDY (*)    Hgb urine dipstick SMALL (*)    Leukocytes, UA LARGE (*)    Bacteria, UA RARE (*)    All other components within normal limits  CULTURE, BLOOD (ROUTINE X 2)  CULTURE, BLOOD (ROUTINE X 2)  URINE CULTURE  LACTIC ACID, PLASMA   ____________________________________________   EKG  Interpreted by me Sinus tachycardia rate 114, normal axis and intervals. Normal QRS ST segments and T waves.  ____________________________________________    RADIOLOGY  No results found.  ____________________________________________   PROCEDURES Procedures Peripheral IV insertion by physician Indication: Multiple failed attempts by nursing staff, need for IV access and/or blood samples for workup Performed under continuous real-time ultrasound visualization Area cleaned with chlorhexidine. 20-gauge IV successfully placed in the right antecubital fossa. 1 attempt, no complications, EBL 0.  CRITICAL CARE Performed by: Joni Fears, Gretel Cantu   Total critical care time: 35 minutes  Critical care time was exclusive of separately billable procedures and treating  other patients.  Critical care was necessary to treat or prevent imminent or life-threatening deterioration.  Critical care was time spent personally by me on the following activities: development of treatment plan with patient and/or surrogate as well as nursing, discussions with consultants, evaluation of patient's response to treatment, examination of patient, obtaining history from patient or surrogate, ordering and performing treatments and interventions, ordering and review of laboratory studies, ordering and review of radiographic studies, pulse oximetry and re-evaluation of patient's condition.  ____________________________________________   INITIAL IMPRESSION / ASSESSMENT AND PLAN / ED COURSE  Pertinent labs & imaging results that were available during my care of the patient were reviewed by me and considered in my medical decision making (see chart for details).  Patient presents with chronic low back pain as well as urinary urgency, has frequent UTIs. Exam is reassuring regarding the low back pain, low suspicion of epidural abscess or hematoma or discitis or osteomyelitis per no evidence of cauda equina syndrome. However, she is febrile tachycardia, concern for pyelonephritis and sepsis. She has normal mental status and normal blood pressure, does not meet criteria for a code sepsis protocol, but we will do a sepsis workup.  Clinical Course as of Jun 27 1957  Mon Jun 27, 2017  1736 Piv placed by me with pocus  [PS]  1905 Labs reveal urinary tract infection with significant leukocytosis and elevated lactate, consistent with pyelonephritis and early sepsis. Plan to hospitalize for further management given age and comorbidities.  [PS]    Clinical Course User Index [PS] Carrie Mew, MD     ____________________________________________   FINAL CLINICAL IMPRESSION(S) / ED DIAGNOSES  Final diagnoses:  Pyelonephritis  Sepsis, due to unspecified organism [facility])      New  Prescriptions   No medications on file     Portions of this note were generated with dragon dictation software. Dictation errors may occur despite best attempts at proofreading.    Carrie Mew, MD 06/27/17 [phone]

## 2017-06-27 NOTE — ED Notes (Signed)
Pt stuck unsuccessfully x2 by this RN and x1 by Ardyth Harps, RN.  EDP notified and IV team consult put in.  EDP to look with ultrasound at this time.   Urine sample obtained and sent to lab.

## 2017-06-27 NOTE — H&P (Signed)
[facility] NAME: Fronnie Urton    MR#:  [id number]  DATE OF BIRTH:  [birth year]  DATE OF ADMISSION:  06/27/2017  PRIMARY CARE PHYSICIAN: Glendon Axe, MD   REQUESTING/REFERRING PHYSICIAN: Dr. Brenton Grills  CHIEF COMPLAINT: Back pain    Chief Complaint  Patient presents with  . Back Pain    HISTORY OF PRESENT ILLNESS:  Marely Apgar  is a 83 y.o. female with a known history of Hypertension, diabetes came in because of acute on chronic low back pain started this afternoon. Complains of back pain across the back, not radiating to the legs. Patient found to have sepsis with UTI on lab analysis. Fever 101.1 Fahrenheit, she complains of mild dysuria. Also has generalized body aches. No chills. Had been admitted in July for UTI. Has been having recurrent urinary infections.  PAST MEDICAL HISTORY:   Past Medical History:  Diagnosis Date  . Chest pain syndrome   . Chronic back pain   . Chronic diarrhea   . GERD (gastroesophageal reflux disease)   . H/O: GI bleed   . Hyperlipidemia   . Hypertension   . IDA (iron deficiency anemia)   . Insomnia   . Non-insulin dependent type 2 diabetes mellitus ([place])   . Pancreatitis     PAST SURGICAL HISTOIRY:   Past Surgical History:  Procedure Laterality Date  . APPENDECTOMY    . CHOLECYSTECTOMY    . EXPLORATORY LAPAROTOMY    . HERNIA REPAIR     x 2  . KNEE ARTHROSCOPY Left   . TOTAL ABDOMINAL HYSTERECTOMY W/ BILATERAL SALPINGOOPHORECTOMY     one ovary still left    SOCIAL HISTORY:   Social History  Substance Use Topics  . Smoking status: Never Smoker  . Smokeless tobacco: Never Used  . Alcohol use No    FAMILY HISTORY:  History reviewed. No pertinent family history.  DRUG ALLERGIES:   Allergies  Allergen Reactions  . Ciprofloxacin Hives  . Codeine Other (See Comments)    Other reaction(s): Headache Reaction:  Headaches   . Escitalopram Oxalate     Other  reaction(s): Other (See Comments) Agitation  . Metronidazole Nausea Only  . Other     Other reaction(s): Unknown anti- Inflammatory and bee stings  . Pantoprazole Nausea Only  . Phenergan [Promethazine Hcl] Other (See Comments)    Reaction:  Unknown   . Sulfamethoxazole-Trimethoprim     Other reaction(s): Unknown    REVIEW OF SYSTEMS:  CONSTITUTIONAL:Fever, back pain, dysuria.  EYES: No blurred or double vision.  EARS, NOSE, AND THROAT: No tinnitus or ear pain.  RESPIRATORY: No cough, shortness of breath, wheezing or hemoptysis.  CARDIOVASCULAR: No chest pain, orthopnea, edema.  GASTROINTESTINAL: No nausea, vomiting, diarrhea or abdominal pain.  GENITOURINARY: No dysuria, hematuria.  ENDOCRINE: No polyuria, nocturia,  HEMATOLOGY: No anemia, easy bruising or bleeding SKIN: No rash or lesion. MUSCULOSKELETAL: No joint pain or arthritis.   NEUROLOGIC: No tingling, numbness, weakness.  PSYCHIATRY: No anxiety or depression.   MEDICATIONS AT HOME:   Prior to Admission medications   Medication Sig Start Date End Date Taking? Authorizing Provider  acetaminophen (TYLENOL) 500 MG tablet Take 500 mg by mouth 2 (two) times daily.    Yes [provider]  atorvastatin (LIPITOR) 20 MG tablet Take 20 mg by mouth at bedtime.    Yes [provider]  insulin NPH-regular Human (NOVOLIN 70/30) (70-30) 100 UNIT/ML injection Inject 3-25 Units into the  skin 2 (two) times daily. Pt uses 20-25 units in the morning. and 3-5 units in the evening.   Yes [provider]  insulin regular (NOVOLIN R,HUMULIN R) 100 units/mL injection Inject 2-10 Units into the skin 2 (two) times daily before a meal. Pt uses as needed per sliding scale:    151-200:  2 units  201-250:  4 units 251-300:  6 units  301-350:  8 units 351-400:  10 units   Yes [provider]  irbesartan (AVAPRO) 150 MG tablet Take 150 mg by mouth daily.   Yes [provider]  memantine (NAMENDA) 5 MG  tablet Take 5 mg by mouth 2 (two) times daily.    Yes [provider]  omeprazole (PRILOSEC) 40 MG capsule Take 40 mg by mouth daily.    Yes [provider]  oxybutynin (DITROPAN-XL) 5 MG 24 hr tablet Take 5 mg by mouth 2 (two) times daily.    Yes [provider]  traMADol (ULTRAM) 50 MG tablet Take 1 tablet (50 mg total) by mouth every 6 (six) hours as needed for moderate pain. 04/16/16  Yes Dustin Flock, MD      VITAL SIGNS:  Blood pressure (!) 113/50, pulse 97, temperature (!) 101.1 F (38.4 C), temperature source Oral, resp. rate (!) 24, height 4\' 9"  (1.448 m), weight 60 kg (132 lb 4.4 oz), SpO2 99 %.  PHYSICAL EXAMINATION:  GENERAL:  83 y.o.-year-old patient lying in the bed withNo distress.  EYES: Pupils equal, round, reactive to light . No scleral icterus. Extraocular muscles intact.  HEENT: Head atraumatic, normocephalic. Oropharynx and nasopharynx clear.  NECK:  Supple, no jugular venous distention. No thyroid enlargement, no tenderness.  LUNGS: Normal breath sounds bilaterally, no wheezing, rales,rhonchi or crepitation. No use of accessory muscles of respiration.  CARDIOVASCULAR: S1, S2 normal. No murmurs, rubs, or gallops.  ABDOMEN: Soft, nontender, nondistended. Bowel sounds present. No organomegaly or mass. No CVA tenderness complaints of severe back pain even with  slight movement in the bed.  EXTREMITIES: No pedal edema, cyanosis, or clubbing. No CVA tenderness present NEUROLOGIC: Cranial nerves II through XII are intact. Muscle strength 5/5 in all extremities. Sensation intact. Gait not checked.  PSYCHIATRIC: The patient is alert and oriented x 3.  SKIN: No obvious rash, lesion, or ulcer.   LABORATORY PANEL:   CBC  Recent Labs Lab 06/27/17 1658  WBC 22.0*  HGB 13.1  HCT 39.8  PLT 453*   ------------------------------------------------------------------------------------------------------------------  Chemistries   Recent Labs Lab  06/27/17 1658  NA 129*  K 4.5  CL 92*  CO2 26  GLUCOSE 128*  BUN 14  CREATININE 0.92  CALCIUM 9.2  AST 26  ALT 9*  ALKPHOS 88  BILITOT 0.6   ------------------------------------------------------------------------------------------------------------------  Cardiac Enzymes No results for input(s): TROPONINI in the last 168 hours. ------------------------------------------------------------------------------------------------------------------  RADIOLOGY:  No results found.  EKG:   Orders placed or performed during the hospital encounter of 04/30/17  . ED EKG  . ED EKG  . EKG 12-Lead  . EKG 12-Lead  Sinus tachycardia with 1 14 bpm, no ST T changes.  IMPRESSION AND PLAN:   #5. 83 year old female patient with sepsis secondary to UTI with evidence of tachycardia, fever, leukocytosis:, Elevated lactic acid Admitted to hospitalist service, start aggressive hydration, IV antibiotics, follow blood cultures, urine cultures, #2 severe low back pain likely secondary to UTI and pyelonephritis. X-ray of the lumbosacral spine ordered. #3 .history of iron deficiency anemia #4 .history of recurrent UTIs:  Follow urine cultures and may need prolonged duration of antibiotics this time/ 5.diabetes mellitus type 2: Continue sliding scale with coverage, once her by mouth intake is good start NPH 70/30. #6 diabetes mellitus type 2 #7 .GERD #8 essential hypertension  All the records are reviewed and case discussed with ED provider. Management plans discussed with the patient, family and they are in agreement.  CODE STATUS: full  TOTAL TIME TAKING CARE OF THIS PATIENT: 57minutes.    Epifanio Lesches M.D on 06/27/2017 at 8:22 PM  Between 7am to 6pm - Pager - [phone]  After 6pm go to [email or web address] - password [email or web address] [facility]  Office  [phone]  CC: Primary care physician; Glendon Axe, MD  Note: This dictation was prepared with Dragon dictation  along with smaller phrase technology. Any transcriptional errors that result from this process are unintentional.

## 2017-06-27 NOTE — Progress Notes (Signed)
Pharmacy Antibiotic Note  Cindy Sullivan is a 83 y.o. female admitted on 06/27/2017 with sepsis and UTI.  Pharmacy has been consulted for vancomycin and Zosyn dosing.  Plan: DW 57kg  Vd 33L kei 0.032 hr-1  T1/2 22 hours Vancomycin 500 mg q 24 hours ordered with stacked dosing. Level before 5th dose. Goal trough 15-20.  Zosyn 3.375 grams q 8 hours ordered.  Height: 4\' 9"  (144.8 cm) Weight: 132 lb 4.4 oz (60 kg) IBW/kg (Calculated) : 38.6  Temp (24hrs), Avg:100.2 F (37.9 C), Min:99.3 F (37.4 C), Max:101.1 F (38.4 C)   Recent Labs Lab 06/27/17 1658  WBC 22.0*  CREATININE 0.92  LATICACIDVEN 2.2*    Estimated Creatinine Clearance: 32.7 mL/min (by C-G formula based on SCr of 0.92 mg/dL).    Allergies  Allergen Reactions  . Ciprofloxacin Hives  . Codeine Other (See Comments)    Other reaction(s): Headache Reaction:  Headaches   . Escitalopram Oxalate     Other reaction(s): Other (See Comments) Agitation  . Metronidazole Nausea Only  . Other     Other reaction(s): Unknown anti- Inflammatory and bee stings  . Pantoprazole Nausea Only  . Phenergan [Promethazine Hcl] Other (See Comments)    Reaction:  Unknown   . Sulfamethoxazole-Trimethoprim     Other reaction(s): Unknown    Antimicrobials this admission: Vancomycin, Zosyn  >>    >>   Dose adjustments this admission:   Microbiology results: 9/3 BCx: pending 9/3 UCx: pending       9/3 UA: LE(+) NO2(-) WBC TNTC  Thank you for allowing pharmacy to be a part of this patient's care.  Edword Cu S 06/27/2017 10:46 PM

## 2017-06-27 NOTE — Progress Notes (Signed)
Prime doc notified awaiting response critical lab value  lactic acid 2.7

## 2017-06-27 NOTE — ED Triage Notes (Signed)
Pt ems from home for acute on chronic back pain that started at 0300. Pt took tylenol/ tramadol at approx. 1500.

## 2017-06-27 NOTE — Progress Notes (Signed)
Dr.Hugelmeyar aware no new orders given. NSS infusing

## 2017-06-27 NOTE — ED Notes (Signed)
Pt O2 sat dropped after administration of fentanyl.  Pt placed 2LNC and up to 99%.  Will continue to monitor.

## 2017-06-28 LAB — GLUCOSE, CAPILLARY
GLUCOSE-CAPILLARY: 161 mg/dL — AB (ref 65–99)
GLUCOSE-CAPILLARY: 142 mg/dL — AB (ref 65–99)
Glucose-Capillary: 148 mg/dL — ABNORMAL HIGH (ref 65–99)
Glucose-Capillary: 158 mg/dL — ABNORMAL HIGH (ref 65–99)

## 2017-06-28 LAB — BASIC METABOLIC PANEL
Anion gap: 7 (ref 5–15)
BUN: 15 mg/dL (ref 6–20)
CHLORIDE: 94 mmol/L — AB (ref 101–111)
CO2: 26 mmol/L (ref 22–32)
CREATININE: 0.82 mg/dL (ref 0.44–1.00)
Calcium: 8.5 mg/dL — ABNORMAL LOW (ref 8.9–10.3)
GFR calc Af Amer: >60 mL/min (ref >60)
GFR calc non Af Amer: >60 mL/min (ref >60)
GLUCOSE: 152 mg/dL — AB (ref 65–99)
Potassium: 4.8 mmol/L (ref 3.5–5.1)
SODIUM: 127 mmol/L — AB (ref 135–145)

## 2017-06-28 LAB — CBC
HEMATOCRIT: 34.1 % — AB (ref 35.0–47.0)
Hemoglobin: 11.3 g/dL — ABNORMAL LOW (ref 12.0–16.0)
MCH: 27.9 pg (ref 26.0–34.0)
MCHC: 33.3 g/dL (ref 32.0–36.0)
MCV: 83.9 fL (ref 80.0–100.0)
PLATELETS: 349 10*3/uL (ref 150–440)
RBC: 4.07 MIL/uL (ref 3.80–5.20)
RDW: 14.4 % (ref 11.5–14.5)
WBC: 22.1 10*3/uL — ABNORMAL HIGH (ref 3.6–11.0)

## 2017-06-28 MED ORDER — DEXTROSE 5 % IV SOLN
1.0000 g | INTRAVENOUS | Status: DC
  Administered 2017-06-28 – 2017-06-29 (×2): 1 g via INTRAVENOUS
  Filled 2017-06-28 (×3): qty 10

## 2017-06-28 NOTE — Care Management Note (Signed)
Case Management Note  Patient Details  Name: Cindy Sullivan MRN: [id number] Date of Birth: October 08, 1929  Subjective/Objective: RNCM consult for discharge planning. No family in room. Patient has dementia. Per last admission patient lives alone but has family in the home around the clock to care for her. She has a home health aide through [facility] 2 days a week, Tuesday and Friday that comes in and bathes patient. Family cares for patient and the house. Patient uses a walker. PCP is Dr. Neldon Mc. She has been seen in the past 6 months (07/18). Patient active with [facility] for RN and PT.  Admitted with sepsis due to UTI.                                  Action/Plan: Following disposition  Expected Discharge Date:                  Expected Discharge Plan:     In-House Referral:     Discharge planning Services  CM Consult  Post Acute Care Choice:    Choice offered to:     DME Arranged:    DME Agency:     HH Arranged:    HH Agency:     Status of Service:  In process, will continue to follow  If discussed at Long Length of Stay Meetings, dates discussed:    Additional Comments:  Jolly Mango, RN 06/28/2017, 9:20 AM

## 2017-06-28 NOTE — Progress Notes (Signed)
ANTIBIOTIC CONSULT NOTE - INITIAL  Pharmacy Consult for Rocephin Indication: UTI  Allergies  Allergen Reactions  . Ciprofloxacin Hives  . Codeine Other (See Comments)    Other reaction(s): Headache Reaction:  Headaches   . Escitalopram Oxalate     Other reaction(s): Other (See Comments) Agitation  . Metronidazole Nausea Only  . Other     Other reaction(s): Unknown anti- Inflammatory and bee stings  . Pantoprazole Nausea Only  . Phenergan [Promethazine Hcl] Other (See Comments)    Reaction:  Unknown   . Sulfamethoxazole-Trimethoprim     Other reaction(s): Unknown    Patient Measurements: Height: 4\' 9"  (144.8 cm) Weight: 132 lb 4.4 oz (60 kg) IBW/kg (Calculated) : 38.6 Adjusted Body Weight:   Vital Signs: Temp: 97.9 F (36.6 C) (09/04 0800) Temp Source: Oral (09/04 0800) BP: 121/53 (09/04 0800) Pulse Rate: 71 (09/04 0800) Intake/Output from previous day: 09/03 0701 - 09/04 0700 In: 300 [IV Piggyback:300] Out: -  Intake/Output from this shift: Total I/O In: 240 [P.O.:240] Out: -   Labs:  Recent Labs  06/27/17 1658 06/28/17 0450  WBC 22.0* 22.1*  HGB 13.1 11.3*  PLT 453* 349  CREATININE 0.92 0.82   Estimated Creatinine Clearance: 36.7 mL/min (by C-G formula based on SCr of 0.82 mg/dL). No results for input(s): VANCOTROUGH, VANCOPEAK, VANCORANDOM, GENTTROUGH, GENTPEAK, GENTRANDOM, TOBRATROUGH, TOBRAPEAK, TOBRARND, AMIKACINPEAK, AMIKACINTROU, AMIKACIN in the last 72 hours.   Microbiology: Recent Results (from the past 720 hour(s))  Blood Culture (routine x 2)     Status: None (Preliminary result)   Collection Time: 06/27/17  4:58 PM  Result Value Ref Range Status   Specimen Description BLOOD LEFT ANTECUBITAL  Final   Special Requests   Final    BOTTLES DRAWN AEROBIC AND ANAEROBIC Blood Culture adequate volume   Culture NO GROWTH < 24 HOURS  Final   Report Status PENDING  Incomplete  Blood Culture (routine x 2)     Status: None (Preliminary result)   Collection Time: 06/27/17  4:59 PM  Result Value Ref Range Status   Specimen Description BLOOD RIGHT ANTECUBITAL  Final   Special Requests   Final    BOTTLES DRAWN AEROBIC AND ANAEROBIC Blood Culture adequate volume   Culture NO GROWTH < 24 HOURS  Final   Report Status PENDING  Incomplete    Medical History: Past Medical History:  Diagnosis Date  . Chest pain syndrome   . Chronic back pain   . Chronic diarrhea   . GERD (gastroesophageal reflux disease)   . H/O: GI bleed   . Hyperlipidemia   . Hypertension   . IDA (iron deficiency anemia)   . Insomnia   . Non-insulin dependent type 2 diabetes mellitus ([place])   . Pancreatitis     Medications:  Prescriptions Prior to Admission  Medication Sig Dispense Refill Last Dose  . acetaminophen (TYLENOL) 500 MG tablet Take 500 mg by mouth 2 (two) times daily.    06/27/2017 at 1200  . atorvastatin (LIPITOR) 20 MG tablet Take 20 mg by mouth at bedtime.    06/26/2017 at 2000  . insulin NPH-regular Human (NOVOLIN 70/30) (70-30) 100 UNIT/ML injection Inject 3-25 Units into the skin 2 (two) times daily. Pt uses 20-25 units in the morning. and 3-5 units in the evening.   06/27/2017 at 0800  . insulin regular (NOVOLIN R,HUMULIN R) 100 units/mL injection Inject 2-10 Units into the skin 2 (two) times daily before a meal. Pt uses as needed per sliding scale:  151-200:  2 units  201-250:  4 units 251-300:  6 units  301-350:  8 units 351-400:  10 units   PRN at PRN  . irbesartan (AVAPRO) 150 MG tablet Take 150 mg by mouth daily.   06/27/2017 at 0800  . memantine (NAMENDA) 5 MG tablet Take 5 mg by mouth 2 (two) times daily.    06/27/2017 at 0800  . omeprazole (PRILOSEC) 40 MG capsule Take 40 mg by mouth daily.    06/27/2017 at 0800  . oxybutynin (DITROPAN-XL) 5 MG 24 hr tablet Take 5 mg by mouth 2 (two) times daily.    06/27/2017 at 0800  . traMADol (ULTRAM) 50 MG tablet Take 1 tablet (50 mg total) by mouth every 6 (six) hours as needed for moderate pain. 30  tablet 0 06/27/2017 at 1200   Scheduled:  . acetaminophen  500 mg Oral BID  . atorvastatin  20 mg Oral QHS  . docusate sodium  100 mg Oral BID  . enoxaparin (LOVENOX) injection  40 mg Subcutaneous Q24H  . insulin aspart  0-9 Units Subcutaneous TID WC  . irbesartan  150 mg Oral Daily  . memantine  5 mg Oral BID  . oxybutynin  5 mg Oral BID   Assessment: Pharmacy consulted to dose and monitor rocephin in this 83 year old woman being treated for urinary tract infection.   Goal of Therapy:    Plan:  Will start ceftriaxone 1 g IV q24 hours.   Amario Longmore D 06/28/2017,9:39 AM

## 2017-06-28 NOTE — Progress Notes (Signed)
[facility] NAME: Cindy Sullivan    MR#:  [id number]  DATE OF BIRTH:  07-14-1929  SUBJECTIVE:   Patient complaining of back pain. She is very jumpy.  REVIEW OF SYSTEMS:    Review of Systems  Constitutional: Negative for fever, chills weight loss HENT: Negative for ear pain, nosebleeds, congestion, facial swelling, rhinorrhea, neck pain, neck stiffness and ear discharge.   Respiratory: Negative for cough, shortness of breath, wheezing  Cardiovascular: Negative for chest pain, palpitations and leg swelling.  Gastrointestinal: Negative for heartburn, abdominal pain, vomiting, diarrhea or consitpation Genitourinary: has visited dysuria no hematuria urgency or frequency Musculoskeletal: positive for back pain or joint pain Neurological: Negative for dizziness, seizures, syncope, focal weakness,  numbness and headaches.  Hematological: Does not bruise/bleed easily.  Psychiatric/Behavioral: Negative for hallucinations, confusion, dysphoric mood    Tolerating Diet: yes      DRUG ALLERGIES:   Allergies  Allergen Reactions  . Ciprofloxacin Hives  . Codeine Other (See Comments)    Other reaction(s): Headache Reaction:  Headaches   . Escitalopram Oxalate     Other reaction(s): Other (See Comments) Agitation  . Metronidazole Nausea Only  . Other     Other reaction(s): Unknown anti- Inflammatory and bee stings  . Pantoprazole Nausea Only  . Phenergan [Promethazine Hcl] Other (See Comments)    Reaction:  Unknown   . Sulfamethoxazole-Trimethoprim     Other reaction(s): Unknown    VITALS:  Blood pressure (!) 121/53, pulse 71, temperature 97.9 F (36.6 C), temperature source Oral, resp. rate 20, height 4\' 9"  (1.448 m), weight 60 kg (132 lb 4.4 oz), SpO2 97 %.  PHYSICAL EXAMINATION:  Constitutional: Appears well-developed and well-nourished. No distress. HENT: Normocephalic. Marland Kitchen Oropharynx is clear and moist.  Eyes: Conjunctivae  and EOM are normal. PERRLA, no scleral icterus.  Neck: Normal ROM. Neck supple. No JVD. No tracheal deviation. CVS: RRR, S1/S2 +, no murmurs, no gallops, no carotid bruit.  Pulmonary: Effort and breath sounds normal, no stridor, rhonchi, wheezes, rales.  Abdominal: Soft. BS +,  no distension, tenderness, rebound or guarding.  Musculoskeletal: Normal range of motion. No edema  When I distract patient she does not  Complain of pain, tenderness is somewhat localized to left lower back  Neuro: Alert. CN 2-12 grossly intact. No focal deficits. Skin: Skin is warm and dry. No rash noted. Psychiatric: anxious     LABORATORY PANEL:   CBC  Recent Labs Lab 06/28/17 0450  WBC 22.1*  HGB 11.3*  HCT 34.1*  PLT 349   ------------------------------------------------------------------------------------------------------------------  Chemistries   Recent Labs Lab 06/27/17 1658 06/28/17 0450  NA 129* 127*  K 4.5 4.8  CL 92* 94*  CO2 26 26  GLUCOSE 128* 152*  BUN 14 15  CREATININE 0.92 0.82  CALCIUM 9.2 8.5*  AST 26  --   ALT 9*  --   ALKPHOS 88  --   BILITOT 0.6  --    ------------------------------------------------------------------------------------------------------------------  Cardiac Enzymes No results for input(s): TROPONINI in the last 168 hours. ------------------------------------------------------------------------------------------------------------------  RADIOLOGY:  Dg Lumbar Spine 2-3 Views  Result Date: 06/27/2017 CLINICAL DATA:  Lower back pain for 2 years. EXAM: LUMBAR SPINE - 2-3 VIEW COMPARISON:  None. FINDINGS: Mild biconcave deformity at L1, L2 and L3, likely chronic. L4 and L5 are normal in height. No evidence of acute fracture. No bone lesion or bony destruction. Good preservation of intervertebral disc spaces. Sacroiliac joints are unremarkable. IMPRESSION: Mild chronic-appearing  biconcave deformities in the upper to mid lumbar spine. No significant  arthritic changes. Electronically Signed   By: Andreas Newport M.D.   On: 06/27/2017 21:32   Dg Sacrum/coccyx  Result Date: 06/27/2017 CLINICAL DATA:  Chronic back pain, 2 years duration. EXAM: SACRUM AND COCCYX - 2+ VIEW COMPARISON:  None. FINDINGS: No fracture or other acute bone abnormality is evident. Sacroiliac joints are intact. Good preservation of lower lumbar intervertebral disc spaces. No bone lesion or bony destruction. IMPRESSION: Negative. Electronically Signed   By: Andreas Newport M.D.   On: 06/27/2017 21:33     ASSESSMENT AND PLAN:   83 year old female with a history of iron deficiency anemia presents with recurrent UTI and pyelonephritis.  1. Sepsis: Patient presented tachycardia, fever and leukocytosis due to urinary tract infection Continue IV fluids Follow-up on final blood and urine culture Continue Rocephin Trend  lactic acid level Watch CBC and feer curve  2. Back pain: This is due to pyelonephritis. X-ray of lumbosacral spine shows no acute pathology  3. Diabetes: Continue sliding scale  4. Hyponatremia from sepsis and poor by mouth intake: Continue IV fluids and repeat BMP in a.m.  5. Hyperlipidemia: Continue statin 6. Essential hypertension: Continue Avapro  7. Dementia: Continue Namenda   Management plans discussed with the patient and son she is in agreement.  CODE STATUS: FULL  TOTAL TIME TAKING CARE OF THIS PATIENT: 30 minutes.     POSSIBLE D/C 1-2 days, DEPENDING ON CLINICAL CONDITION.   Zeno Hickel M.D on 06/28/2017 at 10:01 AM  Between 7am to 6pm - Pager - [phone] After 6pm go to [email or web address] - password [email or web address] [facility]  Office  [phone]  CC: Primary care physician; Glendon Axe, MD  Note: This dictation was prepared with Dragon dictation along with smaller phrase technology. Any transcriptional errors that result from this process are unintentional.

## 2017-06-29 LAB — BASIC METABOLIC PANEL
ANION GAP: 6 (ref 5–15)
BUN: 12 mg/dL (ref 6–20)
CHLORIDE: 96 mmol/L — AB (ref 101–111)
CO2: 27 mmol/L (ref 22–32)
CREATININE: 0.7 mg/dL (ref 0.44–1.00)
Calcium: 8.5 mg/dL — ABNORMAL LOW (ref 8.9–10.3)
GFR calc non Af Amer: >60 mL/min (ref >60)
GLUCOSE: 146 mg/dL — AB (ref 65–99)
POTASSIUM: 3.9 mmol/L (ref 3.5–5.1)
Sodium: 129 mmol/L — ABNORMAL LOW (ref 135–145)

## 2017-06-29 LAB — CBC
HCT: 36 % (ref 35.0–47.0)
Hemoglobin: 12 g/dL (ref 12.0–16.0)
MCH: 28.2 pg (ref 26.0–34.0)
MCHC: 33.5 g/dL (ref 32.0–36.0)
MCV: 84.2 fL (ref 80.0–100.0)
PLATELETS: 362 10*3/uL (ref 150–440)
RBC: 4.27 MIL/uL (ref 3.80–5.20)
RDW: 14.5 % (ref 11.5–14.5)
WBC: 15.9 10*3/uL — ABNORMAL HIGH (ref 3.6–11.0)

## 2017-06-29 LAB — GLUCOSE, CAPILLARY
GLUCOSE-CAPILLARY: 154 mg/dL — AB (ref 65–99)
GLUCOSE-CAPILLARY: 113 mg/dL — AB (ref 65–99)

## 2017-06-29 MED ORDER — CEPHALEXIN 500 MG PO CAPS: 500.0000 mg | ORAL_CAPSULE | Freq: Three times a day (TID) | ORAL | 0 refills | Status: DC

## 2017-06-29 MED ORDER — CEPHALEXIN 500 MG PO CAPS: 500.0000 mg | ORAL_CAPSULE | Freq: Two times a day (BID) | ORAL | 0 refills | Status: AC

## 2017-06-29 NOTE — Progress Notes (Signed)
Discharge instructions and medication details reviewed with patient and daughter. All questions addressed. Printed prescription for Kleflex given to patient along with printed AVS. IV removed. Pt escorted out via wheelchair.  Wynema Birch, RN

## 2017-06-29 NOTE — Care Management Note (Signed)
Case Management Note  Patient Details  Name: Cindy Sullivan MRN: [id number] Date of Birth: 07-26-1929  Subjective/Objective: Discharging today. Unable to reach [place]. Daughter, Cindy Sullivan in the room. Updated her on POC and resumption of home health. [facility] notified of discharge. Orders in for home health. Daughter will transport by car                    Action/Plan: Resumption of care with [facility] for nursing and PT.   Expected Discharge Date:  06/29/17               Expected Discharge Plan:  [place]  In-House Referral:     Discharge planning Services  CM Consult  Post Acute Care Choice:  Home Health Choice offered to:     DME Arranged:    DME Agency:     HH Arranged:  RN, PT HH Agency:  [facility]  Status of Service:  Completed, signed off  If discussed at [place] of Stay Meetings, dates discussed:    Additional Comments:  Jolly Mango, RN 06/29/2017, 12:25 PM

## 2017-06-29 NOTE — Evaluation (Signed)
Physical Therapy Evaluation Patient Details Name: Cindy Sullivan MRN: [id number] DOB: Oct 23, 1929 Today's Date: 06/29/2017   History of Present Illness  Cindy Sullivan is a 83 y.o. female with a known history of hypertension, diabetes came in because of acute on chronic low back pain started this afternoon. Complains of back pain across the back, not radiating to the legs. Patient found to have sepsis with UTI on lab analysis. Fever 101.1 Fahrenheit, she complains of mild dysuria. Also has generalized body aches. No chills. Had been admitted in July for UTI. Has been having recurrent urinary infections. PT is currently admitted for sepsis secondary to UTI and back pain due to pyelonephritis.   Clinical Impression  Pt admitted with above diagnosis. Pt currently with functional limitations due to the deficits listed below (see PT Problem List).  Pt and daughter report that patient's mobility is close to her baseline. She requires minA+1 to help her legs when getting onto and off of the bed but this is normal for patient to require assist. She requires CGA only for transfers and ambulation. She ambulates with slow but steady gait speed partially around RN station. Good stability and safety awareness with rolling walker. Denies fatigue with ambulation and VSS throughout with SaO2>90% on room air. Pt has to be reminded to continue with walker all the way until she arrives at the bed after returning to the room. Pt is safe to return home with her family and resume [place] PT. Encouraged to continue using walker for safety. Pt will benefit from PT services to address deficits in strength, balance, and mobility in order to return to full function at home.     Follow Up Recommendations Home health PT (Resume HH PT at discharge)    Equipment Recommendations       Recommendations for Other Services       Precautions / Restrictions Precautions Precautions: Fall Restrictions Weight Bearing Restrictions: No       Mobility  Bed Mobility Overal bed mobility: Needs Assistance Bed Mobility: Supine to Sit;Sit to Supine     Supine to sit: Min assist Sit to supine: Min assist   General bed mobility comments: Pt requires assist for LE management during bed mobility  Transfers Overall transfer level: Needs assistance Equipment used: Rolling walker (2 wheeled) Transfers: Sit to/from Stand Sit to Stand: Min guard         General transfer comment: Pt requiring increased time to perform transfers but is steady and stable with UE support once standing. Cues for safe hand placement provided by therapist  Ambulation/Gait Ambulation/Gait assistance: Min guard Ambulation Distance (Feet): 120 Feet Assistive device: Rolling walker (2 wheeled) Gait Pattern/deviations: Decreased step length - right;Decreased step length - left Gait velocity: Decreased Gait velocity interpretation: <1.8 ft/sec, indicative of risk for recurrent falls General Gait Details: Pt ambulates with slow but steady gait speed partially around RN station. Good stability and safety awareness with rolling walker. Denies fatigue with ambulation and VSS throughout with SaO2>90% on room air. Pt has to be reminded to continue with walker all the way until she arrives at the bed after returning to the room  Stairs            Wheelchair Mobility    Modified Rankin (Stroke Patients Only)       Balance Overall balance assessment: Needs assistance Sitting-balance support: No upper extremity supported Sitting balance-Leahy Scale: Good     Standing balance support: No upper extremity supported Standing balance-Leahy Scale: [place] Standing  balance comment: Able to maintain wide stance without UE support. Requires support to place feet together. Positive Rhomberg and single leg balance is <1second bilaterally                             Pertinent Vitals/Pain Pain Assessment: No/denies pain    Home Living  Family/patient expects to be discharged to:: Private residence Living Arrangements: Alone;Other (Comment) (24/7 care by family, brother is primary caregiver) Available Help at Discharge: Family;Personal care attendant;Other (Comment) (Care attendant 2x/wk) Type of Home: House Home Access: Stairs to enter Entrance Stairs-Rails: Right;Left;Can reach both Entrance Stairs-Number of Steps: 3 Home Layout: One level Home Equipment: Walker - 2 wheels;Shower seat;Cane - single point      Prior Function Level of Independence: Needs assistance   Gait / Transfers Assistance Needed: household distances only with RW;   ADL's / Homemaking Assistance Needed: has home health aide 2x a week help with bathing; brother and niece assist with cooking and cleaning;   Comments: Brother and niece assist with groceries, some meals, and transportation.      Hand Dominance   Dominant Hand: Right    Extremity/Trunk Assessment   Upper Extremity Assessment Upper Extremity Assessment: Overall WFL for tasks assessed    Lower Extremity Assessment Lower Extremity Assessment: Generalized weakness (Bilateral LE deconditioning)       Communication   Communication: HOH  Cognition Arousal/Alertness: Awake/alert Behavior During Therapy: WFL for tasks assessed/performed Overall Cognitive Status: Within Functional Limits for tasks assessed                                        General Comments      Exercises     Assessment/Plan    PT Assessment Patient needs continued PT services  PT Problem List Decreased strength;Decreased balance       PT Treatment Interventions DME instruction;Gait training;Stair training;Therapeutic activities;Functional mobility training;Therapeutic exercise;Balance training;Neuromuscular re-education;Patient/family education    PT Goals (Current goals can be found in the Care Plan section)  Acute Rehab PT Goals Patient Stated Goal: Return to prior level of  function at home PT Goal Formulation: With patient/family Time For Goal Achievement: 07/13/17 Potential to Achieve Goals: Good    Frequency Min 2X/week   Barriers to discharge        Co-evaluation               AM-PAC PT "6 Clicks" Daily Activity  Outcome Measure Difficulty turning over in bed (including adjusting bedclothes, sheets and blankets)?: Unable Difficulty moving from lying on back to sitting on the side of the bed? : Unable Difficulty sitting down on and standing up from a chair with arms (e.g., wheelchair, bedside commode, etc,.)?: A Little Help needed moving to and from a bed to chair (including a wheelchair)?: A Little Help needed walking in hospital room?: A Little Help needed climbing 3-5 steps with a railing? : A Lot 6 Click Score: 13    End of Session Equipment Utilized During Treatment: Gait belt Activity Tolerance: Patient tolerated treatment well Patient left: in bed;with call bell/phone within reach;with bed alarm set;with family/visitor present   PT Visit Diagnosis: Unsteadiness on feet (R26.81);Muscle weakness (generalized) (M62.81)    Time: 6063-0160 PT Time Calculation (min) (ACUTE ONLY): 26 min   Charges:   PT Evaluation $PT Eval Low Complexity: 1 Low PT Treatments $  Gait Training: 8-22 mins   PT G Codes:   PT G-Codes **NOT FOR INPATIENT CLASS** Functional Assessment Tool Used: AM-PAC 6 Clicks Basic Mobility Functional Limitation: Mobility: Walking and moving around Mobility: Walking and Moving Around Current Status (B1660): At least 40 percent but less than 60 percent impaired, limited or restricted Mobility: Walking and Moving Around Goal Status [phone]): At least 20 percent but less than 40 percent impaired, limited or restricted   Phillips Grout PT, DPT   Huprich,Jason 06/29/2017, 12:39 PM

## 2017-06-29 NOTE — Progress Notes (Signed)
[facility] at [place] NAME: Cindy Sullivan    MR#:  [id number]  DATE OF BIRTH:  [birth year]  SUBJECTIVE:   Patient sitting in chair eating breakfast pain seems to be controlled.  REVIEW OF SYSTEMS:    Review of Systems  Constitutional: Negative for fever, chills weight loss HENT: Negative for ear pain, nosebleeds, congestion, facial swelling, rhinorrhea, neck pain, neck stiffness and ear discharge.   Respiratory: Negative for cough, shortness of breath, wheezing  Cardiovascular: Negative for chest pain, palpitations and leg swelling.  Gastrointestinal: Negative for heartburn, abdominal pain, vomiting, diarrhea or consitpation Genitourinary: has visited dysuria no hematuria urgency or frequency Musculoskeletal: negative for back pain or joint pain Neurological: Negative for dizziness, seizures, syncope, focal weakness,  numbness and headaches.  Hematological: Does not bruise/bleed easily.  Psychiatric/Behavioral: Negative for hallucinations, confusion, dysphoric mood    Tolerating Diet: yes      DRUG ALLERGIES:   Allergies  Allergen Reactions  . Ciprofloxacin Hives  . Codeine Other (See Comments)    Other reaction(s): Headache Reaction:  Headaches   . Escitalopram Oxalate     Other reaction(s): Other (See Comments) Agitation  . Metronidazole Nausea Only  . Other     Other reaction(s): Unknown anti- Inflammatory and bee stings  . Pantoprazole Nausea Only  . Phenergan [Promethazine Hcl] Other (See Comments)    Reaction:  Unknown   . Sulfamethoxazole-Trimethoprim     Other reaction(s): Unknown    VITALS:  Blood pressure (!) 150/59, pulse 93, temperature 97.8 F (36.6 C), temperature source Oral, resp. rate 18, height 4\' 9"  (1.448 m), weight 60.7 kg (133 lb 12.8 oz), SpO2 97 %.  PHYSICAL EXAMINATION:  Constitutional: Appears well-developed and well-nourished. No distress. HENT: Normocephalic. Marland Kitchen Oropharynx is clear and moist.   Eyes: Conjunctivae and EOM are normal. PERRLA, no scleral icterus.  Neck: Normal ROM. Neck supple. No JVD. No tracheal deviation. CVS: RRR, S1/S2 +, no murmurs, no gallops, no carotid bruit.  Pulmonary: Effort and breath sounds normal, no stridor, rhonchi, wheezes, rales.  Abdominal: Soft. BS +,  no distension, tenderness, rebound or guarding.  Musculoskeletal: Normal range of motion. No edema   Neuro: Alert. CN 2-12 grossly intact. No focal deficits. Skin: Skin is warm and dry. No rash noted. Psychiatric: normal affect    LABORATORY PANEL:   CBC  Recent Labs Lab 06/29/17 0421  WBC 15.9*  HGB 12.0  HCT 36.0  PLT 362   ------------------------------------------------------------------------------------------------------------------  Chemistries   Recent Labs Lab 06/27/17 1658  06/29/17 0421  NA 129*  < > 129*  K 4.5  < > 3.9  CL 92*  < > 96*  CO2 26  < > 27  GLUCOSE 128*  < > 146*  BUN 14  < > 12  CREATININE 0.92  < > 0.70  CALCIUM 9.2  < > 8.5*  AST 26  --   --   ALT 9*  --   --   ALKPHOS 88  --   --   BILITOT 0.6  --   --   < > = values in this interval not displayed. ------------------------------------------------------------------------------------------------------------------  Cardiac Enzymes No results for input(s): TROPONINI in the last 168 hours. ------------------------------------------------------------------------------------------------------------------  RADIOLOGY:  Dg Lumbar Spine 2-3 Views  Result Date: 06/27/2017 CLINICAL DATA:  Lower back pain for 2 years. EXAM: LUMBAR SPINE - 2-3 VIEW COMPARISON:  None. FINDINGS: Mild biconcave deformity at L1, L2 and L3, likely chronic. L4 and L5  are normal in height. No evidence of acute fracture. No bone lesion or bony destruction. Good preservation of intervertebral disc spaces. Sacroiliac joints are unremarkable. IMPRESSION: Mild chronic-appearing biconcave deformities in the upper to mid lumbar spine.  No significant arthritic changes. Electronically Signed   By: Andreas Newport M.D.   On: 06/27/2017 21:32   Dg Sacrum/coccyx  Result Date: 06/27/2017 CLINICAL DATA:  Chronic back pain, 2 years duration. EXAM: SACRUM AND COCCYX - 2+ VIEW COMPARISON:  None. FINDINGS: No fracture or other acute bone abnormality is evident. Sacroiliac joints are intact. Good preservation of lower lumbar intervertebral disc spaces. No bone lesion or bony destruction. IMPRESSION: Negative. Electronically Signed   By: Andreas Newport M.D.   On: 06/27/2017 21:33     ASSESSMENT AND PLAN:   83 year old female with a history of iron deficiency anemia presents with recurrent UTI and pyelonephritis.  1. Sepsis: Patient presented tachycardia, fever and leukocytosis due to urinary tract infection CBC has improved Urine culture greater than 100,000 gram-negative rod Continue Rocephin  2. Back pain: This is due to pyelonephritis. X-ray of lumbosacral spine shows no acute pathology  3. Diabetes: Continue sliding scale  4. Hyponatremia from sepsis and poor by mouth intake: Continue IV fluids and repeat BMP in a.m.  5. Hyperlipidemia: Continue statin 6. Essential hypertension: Continue Avapro  7. Dementia: Continue Namenda   Management plans discussed with the patient and daughter she is in agreement.  CODE STATUS: FULL  TOTAL TIME TAKING CARE OF THIS PATIENT: 24 minutes.   PT consultation for disposition.  POSSIBLE D/C today , DEPENDING ON CLINICAL CONDITION.   Deadrick Stidd M.D on 06/29/2017 at 10:53 AM  Between 7am to 6pm - Pager - [phone] After 6pm go to [email or web address] - password [email or web address] [facility]  Office  [phone]  CC: Primary care physician; Glendon Axe, MD  Note: This dictation was prepared with Dragon dictation along with smaller phrase technology. Any transcriptional errors that result from this process are unintentional.

## 2017-06-29 NOTE — Care Management (Signed)
TC received from son, Eddie Dibbles. Updated him on POC.

## 2017-06-29 NOTE — Care Management Important Message (Signed)
Important Message  Patient Details  Name: TURNER KUNZMAN MRN: [id number] Date of Birth: [birth year]   Medicare Important Message Given:  N/A - LOS <3 / Initial given by admissions    Jolly Mango, RN 06/29/2017, 1:40 PM

## 2017-06-29 NOTE — Progress Notes (Signed)
Pt has minimal bleeding in front lower gums. It appears to have clotted off. Wet washcloth given to patient to apply to site. MD notified. Will continue to monitor and check H&H.

## 2017-06-29 NOTE — Discharge Summary (Signed)
[facility] at [place] NAME: Cindy Sullivan    MR#:  [id number]  DATE OF BIRTH:  02-11-1929  DATE OF ADMISSION:  06/27/2017 ADMITTING PHYSICIAN: Epifanio Lesches, MD  DATE OF DISCHARGE: 06/29/2017  PRIMARY CARE PHYSICIAN: Glendon Axe, MD    ADMISSION DIAGNOSIS:  Back pain [M54.9] Pyelonephritis [N12] Sepsis, due to unspecified organism ([place]) [A41.9]  DISCHARGE DIAGNOSIS:  Active Problems:   Sepsis ([place])  Pyelonephritis SECONDARY DIAGNOSIS:   Past Medical History:  Diagnosis Date  . Chest pain syndrome   . Chronic back pain   . Chronic diarrhea   . GERD (gastroesophageal reflux disease)   . H/O: GI bleed   . Hyperlipidemia   . Hypertension   . IDA (iron deficiency anemia)   . Insomnia   . Non-insulin dependent type 2 diabetes mellitus ([place])   . Pancreatitis     HOSPITAL COURSE:   83 year old female with a history of iron deficiency anemia presents with recurrent UTI and pyelonephritis.  1. Sepsis: Patient presented tachycardia, fever and leukocytosis due to urinary tract infection CBC has improved Urine culture greater than 100,000 gram-negative rod She did well with Rocephin and will be transitioned to oral Keflex  2. Back pain: This is due to pyelonephritis. X-ray of lumbosacral spine shows no acute pathology  3. Diabetes: Continue outpatient regimen 4. Hyponatremia from sepsis and poor by mouth intake: 5. Hyperlipidemia: Continue statin 6. Essential hypertension: Continue Avapro  7. Dementia: Continue Namenda   DISCHARGE CONDITIONS AND DIET:   Stable for discharge on ADA diet  CONSULTS OBTAINED:    DRUG ALLERGIES:   Allergies  Allergen Reactions  . Ciprofloxacin Hives  . Codeine Other (See Comments)    Other reaction(s): Headache Reaction:  Headaches   . Escitalopram Oxalate     Other reaction(s): Other (See Comments) Agitation  . Metronidazole Nausea Only  . Other     Other reaction(s):  Unknown anti- Inflammatory and bee stings  . Pantoprazole Nausea Only  . Phenergan [Promethazine Hcl] Other (See Comments)    Reaction:  Unknown   . Sulfamethoxazole-Trimethoprim     Other reaction(s): Unknown    DISCHARGE MEDICATIONS:   Current Discharge Medication List    START taking these medications   Details  cephALEXin (KEFLEX) 500 MG capsule Take 1 capsule (500 mg total) by mouth 2 (two) times daily. Qty: 20 capsule, Refills: 0      CONTINUE these medications which have NOT CHANGED   Details  acetaminophen (TYLENOL) 500 MG tablet Take 500 mg by mouth 2 (two) times daily.     atorvastatin (LIPITOR) 20 MG tablet Take 20 mg by mouth at bedtime.     insulin NPH-regular Human (NOVOLIN 70/30) (70-30) 100 UNIT/ML injection Inject 3-25 Units into the skin 2 (two) times daily. Pt uses 20-25 units in the morning. and 3-5 units in the evening.    insulin regular (NOVOLIN R,HUMULIN R) 100 units/mL injection Inject 2-10 Units into the skin 2 (two) times daily before a meal. Pt uses as needed per sliding scale:    151-200:  2 units  201-250:  4 units 251-300:  6 units  301-350:  8 units 351-400:  10 units    irbesartan (AVAPRO) 150 MG tablet Take 150 mg by mouth daily.    memantine (NAMENDA) 5 MG tablet Take 5 mg by mouth 2 (two) times daily.     omeprazole (PRILOSEC) 40 MG capsule Take 40 mg by mouth daily.  oxybutynin (DITROPAN-XL) 5 MG 24 hr tablet Take 5 mg by mouth 2 (two) times daily.     traMADol (ULTRAM) 50 MG tablet Take 1 tablet (50 mg total) by mouth every 6 (six) hours as needed for moderate pain. Qty: 30 tablet, Refills: 0          Today   CHIEF COMPLAINT:   No back pains morning. Sitting comfortably   VITAL SIGNS:  Blood pressure (!) 150/59, pulse 93, temperature 97.8 F (36.6 C), temperature source Oral, resp. rate 18, height 4\' 9"  (1.448 m), weight 60.7 kg (133 lb 12.8 oz), SpO2 97 %.   REVIEW OF SYSTEMS:  Review of Systems   Constitutional: Negative.  Negative for chills, fever and malaise/fatigue.  HENT: Negative.  Negative for ear discharge, ear pain, hearing loss, nosebleeds and sore throat.   Eyes: Negative.  Negative for blurred vision and pain.  Respiratory: Negative.  Negative for cough, hemoptysis, shortness of breath and wheezing.   Cardiovascular: Negative.  Negative for chest pain, palpitations and leg swelling.  Gastrointestinal: Negative.  Negative for abdominal pain, blood in stool, diarrhea, nausea and vomiting.  Genitourinary: Negative.  Negative for dysuria.  Musculoskeletal: Negative.  Negative for back pain.  Skin: Negative.   Neurological: Negative for dizziness, tremors, speech change, focal weakness, seizures and headaches.  Endo/Heme/Allergies: Negative.  Does not bruise/bleed easily.  Psychiatric/Behavioral: Negative.  Negative for depression, hallucinations and suicidal ideas.     PHYSICAL EXAMINATION:  GENERAL:  83 y.o.-year-old patient lying in the bed with no acute distress.  NECK:  Supple, no jugular venous distention. No thyroid enlargement, no tenderness.  LUNGS: Normal breath sounds bilaterally, no wheezing, rales,rhonchi  No use of accessory muscles of respiration.  CARDIOVASCULAR: S1, S2 normal. No murmurs, rubs, or gallops.  ABDOMEN: Soft, non-tender, non-distended. Bowel sounds present. No organomegaly or mass.  EXTREMITIES: No pedal edema, cyanosis, or clubbing.  PSYCHIATRIC: The patient is alert and oriented x 3.  SKIN: No obvious rash, lesion, or ulcer.   DATA REVIEW:   CBC  Recent Labs Lab 06/29/17 0421  WBC 15.9*  HGB 12.0  HCT 36.0  PLT 362    Chemistries   Recent Labs Lab 06/27/17 1658  06/29/17 0421  NA 129*  < > 129*  K 4.5  < > 3.9  CL 92*  < > 96*  CO2 26  < > 27  GLUCOSE 128*  < > 146*  BUN 14  < > 12  CREATININE 0.92  < > 0.70  CALCIUM 9.2  < > 8.5*  AST 26  --   --   ALT 9*  --   --   ALKPHOS 88  --   --   BILITOT 0.6  --   --   <  > = values in this interval not displayed.  Cardiac Enzymes No results for input(s): TROPONINI in the last 168 hours.  Microbiology Results  @[id number] @  RADIOLOGY:  Dg Lumbar Spine 2-3 Views  Result Date: 06/27/2017 CLINICAL DATA:  Lower back pain for 2 years. EXAM: LUMBAR SPINE - 2-3 VIEW COMPARISON:  None. FINDINGS: Mild biconcave deformity at L1, L2 and L3, likely chronic. L4 and L5 are normal in height. No evidence of acute fracture. No bone lesion or bony destruction. Good preservation of intervertebral disc spaces. Sacroiliac joints are unremarkable. IMPRESSION: Mild chronic-appearing biconcave deformities in the upper to mid lumbar spine. No significant arthritic changes. Electronically Signed   By: Andreas Newport M.D.   On:  06/27/2017 21:32   Dg Sacrum/coccyx  Result Date: 06/27/2017 CLINICAL DATA:  Chronic back pain, 2 years duration. EXAM: SACRUM AND COCCYX - 2+ VIEW COMPARISON:  None. FINDINGS: No fracture or other acute bone abnormality is evident. Sacroiliac joints are intact. Good preservation of lower lumbar intervertebral disc spaces. No bone lesion or bony destruction. IMPRESSION: Negative. Electronically Signed   By: Andreas Newport M.D.   On: 06/27/2017 21:33      Current Discharge Medication List    START taking these medications   Details  cephALEXin (KEFLEX) 500 MG capsule Take 1 capsule (500 mg total) by mouth 2 (two) times daily. Qty: 20 capsule, Refills: 0      CONTINUE these medications which have NOT CHANGED   Details  acetaminophen (TYLENOL) 500 MG tablet Take 500 mg by mouth 2 (two) times daily.     atorvastatin (LIPITOR) 20 MG tablet Take 20 mg by mouth at bedtime.     insulin NPH-regular Human (NOVOLIN 70/30) (70-30) 100 UNIT/ML injection Inject 3-25 Units into the skin 2 (two) times daily. Pt uses 20-25 units in the morning. and 3-5 units in the evening.    insulin regular (NOVOLIN R,HUMULIN R) 100 units/mL injection Inject 2-10 Units into the  skin 2 (two) times daily before a meal. Pt uses as needed per sliding scale:    151-200:  2 units  201-250:  4 units 251-300:  6 units  301-350:  8 units 351-400:  10 units    irbesartan (AVAPRO) 150 MG tablet Take 150 mg by mouth daily.    memantine (NAMENDA) 5 MG tablet Take 5 mg by mouth 2 (two) times daily.     omeprazole (PRILOSEC) 40 MG capsule Take 40 mg by mouth daily.     oxybutynin (DITROPAN-XL) 5 MG 24 hr tablet Take 5 mg by mouth 2 (two) times daily.     traMADol (ULTRAM) 50 MG tablet Take 1 tablet (50 mg total) by mouth every 6 (six) hours as needed for moderate pain. Qty: 30 tablet, Refills: 0           Management plans discussed with the patient and daughter and she is in agreement. Stable for discharge   Patient should follow up with PCP  CODE STATUS:     Code Status Orders        Start     Ordered   06/27/17 2009  Full code  Continuous     06/27/17 2009    Code Status History    Date Active Date Inactive Code Status Order ID Comments User Context   05/01/2017  1:20 AM 05/02/2017  2:46 PM Full Code [id number]  Harvie Bridge, DO Inpatient   04/13/2016  9:15 PM 04/17/2016  6:30 PM Full Code [id number]  Henreitta Leber, MD Inpatient    Advance Directive Documentation     Most Recent Value  Type of Advance Directive  Healthcare Power of Attorney  Pre-existing out of facility DNR order (yellow form or pink MOST form)  -  "MOST" Form in Place?  -      TOTAL TIME TAKING CARE OF THIS PATIENT: 37 minutes.    Note: This dictation was prepared with Dragon dictation along with smaller phrase technology. Any transcriptional errors that result from this process are unintentional.  Tkeyah Burkman M.D on 06/29/2017 at 10:58 AM  Between 7am to 6pm - Pager - [phone] After 6pm go to [email or web address] - Proofreader  [employer]  [phone]  CC: Primary care physician; Glendon Axe, MD

## 2017-06-30 LAB — URINE CULTURE: Special Requests: NORMAL

## 2017-06-30 NOTE — Progress Notes (Signed)
U Cx shows ESBL E coli.  I contacted [facility] for Augmentin BID for 10 days and patient's family with update.

## 2017-07-02 LAB — CULTURE, BLOOD (ROUTINE X 2)
CULTURE: NO GROWTH
Culture: NO GROWTH
SPECIAL REQUESTS: ADEQUATE
Special Requests: ADEQUATE

## 2017-07-04 ENCOUNTER — Inpatient Hospital Stay: Payer: Medicare Other | Attending: Internal Medicine

## 2017-07-04 DIAGNOSIS — Z452 Encounter for adjustment and management of vascular access device: Secondary | ICD-10-CM | POA: Insufficient documentation

## 2017-07-04 DIAGNOSIS — D500 Iron deficiency anemia secondary to blood loss (chronic): Secondary | ICD-10-CM | POA: Insufficient documentation

## 2017-07-18 ENCOUNTER — Inpatient Hospital Stay: Payer: Medicare Other

## 2017-07-19 ENCOUNTER — Ambulatory Visit: Payer: Self-pay | Admitting: Urology

## 2017-07-20 ENCOUNTER — Inpatient Hospital Stay: Payer: Medicare Other

## 2017-07-20 DIAGNOSIS — D500 Iron deficiency anemia secondary to blood loss (chronic): Secondary | ICD-10-CM | POA: Diagnosis present

## 2017-07-20 DIAGNOSIS — Z452 Encounter for adjustment and management of vascular access device: Secondary | ICD-10-CM | POA: Diagnosis not present

## 2017-07-20 DIAGNOSIS — Z95828 Presence of other vascular implants and grafts: Secondary | ICD-10-CM

## 2017-07-20 MED ORDER — SODIUM CHLORIDE 0.9% FLUSH
10.0000 mL | INTRAVENOUS | Status: AC | PRN
  Administered 2017-07-20: 10 mL
  Filled 2017-07-20: qty 10

## 2017-07-20 MED ORDER — HEPARIN SOD (PORK) LOCK FLUSH 100 UNIT/ML IV SOLN
500.0000 [IU] | INTRAVENOUS | Status: AC | PRN
  Administered 2017-07-20: 500 [IU]

## 2017-08-24 ENCOUNTER — Encounter: Payer: Self-pay | Admitting: Urology

## 2017-08-24 ENCOUNTER — Ambulatory Visit (INDEPENDENT_AMBULATORY_CARE_PROVIDER_SITE_OTHER): Payer: Medicare Other | Admitting: Urology

## 2017-08-24 VITALS — BP 152/77 | HR 101 | Ht <= 58 in | Wt 130.0 lb

## 2017-08-24 DIAGNOSIS — N390 Urinary tract infection, site not specified: Secondary | ICD-10-CM | POA: Diagnosis not present

## 2017-08-24 DIAGNOSIS — N12 Tubulo-interstitial nephritis, not specified as acute or chronic: Secondary | ICD-10-CM | POA: Diagnosis not present

## 2017-08-24 MED ORDER — ESTROGENS, CONJUGATED 0.625 MG/GM VA CREA: 1.0000 | TOPICAL_CREAM | Freq: Every day | VAGINAL | 12 refills | Status: DC

## 2017-08-24 NOTE — Progress Notes (Signed)
08/24/2017 2:21 PM   Cindy Sullivan [birth year] [id number]  Referring provider: Glendon Axe, MD [facility] [place], [place] [address]  Chief Complaint  Patient presents with  . Pyelonephritis    New patient  . Recurrent UTI    HPI: 83 year old female referred for further evaluation of recurrent urinary tract infections.  She had 2 admissions over the past year for urinary tract infection.  Urine culture from 06/2017 grew E. Coli (ESBL) and urine culture from 04/2017 grew multiple specimens including pansensitive E. coli and Klebsiella.  She was treated with Augmentin for her last infection which was only intermediately sensitive.  Cross-sectional imaging including CT abdomen pelvis with contrast on 04/30/2017 shows normal kidneys bilaterally although scattered cortical thinning and scarring was noted.  She has a 2.6 cm simple renal cyst on the left.  There is no evidence of stones.  She did have intraluminal gas most consistent with gas-forming cystitis.  She is already taking probiotic.  She also takes cranberry tabs in the morning daily.  Today in clinic, she did not feel like she could void.  She was catheterized for a specimen.  She only had 175 cc in her bladder at the time.  She does wear depends daily.  These are often saturated.  This is been going on for quite some time.  She is not bothered by this.  She does wipe back to front.  She has difficulty ambulating.  She does have 24-hour care at home.  She does not perform any of her ADLs.  PMH: Past Medical History:  Diagnosis Date  . Chest pain syndrome   . Chronic back pain   . Chronic diarrhea   . GERD (gastroesophageal reflux disease)   . H/O: GI bleed   . Hyperlipidemia   . Hypertension   . IDA (iron deficiency anemia)   . Insomnia   . Non-insulin dependent type 2 diabetes mellitus ([place])   . Pancreatitis     Surgical History: Past Surgical History:  Procedure Laterality Date  .  APPENDECTOMY    . CHOLECYSTECTOMY    . EXPLORATORY LAPAROTOMY    . HERNIA REPAIR     x 2  . KNEE ARTHROSCOPY Left   . TOTAL ABDOMINAL HYSTERECTOMY W/ BILATERAL SALPINGOOPHORECTOMY     one ovary still left    Home Medications:  Allergies as of 08/24/2017      Reactions   Ciprofloxacin Hives   Codeine Other (See Comments)   Other reaction(s): Headache Reaction:  Headaches    Escitalopram Oxalate    Other reaction(s): Other (See Comments) Agitation   Metronidazole Nausea Only   Other    Other reaction(s): Unknown anti- Inflammatory and bee stings   Pantoprazole Nausea Only   Phenergan [promethazine Hcl] Other (See Comments)   Reaction:  Unknown    Sulfamethoxazole-trimethoprim    Other reaction(s): Unknown      Medication List       Accurate as of 08/24/17 11:59 PM. Always use your most recent med list.          acetaminophen 500 MG tablet Commonly known as:  TYLENOL Take 500 mg by mouth 2 (two) times daily.   atorvastatin 20 MG tablet Commonly known as:  LIPITOR Take 20 mg by mouth at bedtime.   conjugated estrogens vaginal cream Commonly known as:  PREMARIN Place 1 Applicatorful vaginally daily. Use pea sized amount M-W-Fr before bedtime   insulin regular 100 units/mL injection Commonly known as:  NOVOLIN R,HUMULIN R Inject 2-10 Units into the skin 2 (two) times daily before a meal. Pt uses as needed per sliding scale:    151-200:  2 units  201-250:  4 units 251-300:  6 units  301-350:  8 units 351-400:  10 units   irbesartan 150 MG tablet Commonly known as:  AVAPRO Take 150 mg by mouth daily.   memantine 5 MG tablet Commonly known as:  NAMENDA Take 5 mg by mouth 2 (two) times daily.   NOVOLIN 70/30 (70-30) 100 UNIT/ML injection Generic drug:  insulin NPH-regular Human Inject 3-25 Units into the skin 2 (two) times daily. Pt uses 20-25 units in the morning. and 3-5 units in the evening.   omeprazole 40 MG capsule Commonly known as:  PRILOSEC Take 40  mg by mouth daily.   oxybutynin 5 MG 24 hr tablet Commonly known as:  DITROPAN-XL Take 5 mg by mouth 2 (two) times daily.   traMADol 50 MG tablet Commonly known as:  ULTRAM Take 1 tablet (50 mg total) by mouth every 6 (six) hours as needed for moderate pain.       Allergies:  Allergies  Allergen Reactions  . Ciprofloxacin Hives  . Codeine Other (See Comments)    Other reaction(s): Headache Reaction:  Headaches   . Escitalopram Oxalate     Other reaction(s): Other (See Comments) Agitation  . Metronidazole Nausea Only  . Other     Other reaction(s): Unknown anti- Inflammatory and bee stings  . Pantoprazole Nausea Only  . Phenergan [Promethazine Hcl] Other (See Comments)    Reaction:  Unknown   . Sulfamethoxazole-Trimethoprim     Other reaction(s): Unknown    Family History: Family History  Problem Relation Age of Onset  . Prostate cancer Neg Hx   . Bladder Cancer Neg Hx   . Kidney cancer Neg Hx     Social History:  reports that she has never smoked. She has never used smokeless tobacco. She reports that she does not drink alcohol or use drugs.  ROS: UROLOGY Frequent Urination?: No Hard to postpone urination?: No Burning/pain with urination?: No Get up at night to urinate?: Yes Leakage of urine?: Yes Urine stream starts and stops?: No Trouble starting stream?: No Do you have to strain to urinate?: No Blood in urine?: No Urinary tract infection?: No Sexually transmitted disease?: No Injury to kidneys or bladder?: No Painful intercourse?: No Weak stream?: No Currently pregnant?: No Vaginal bleeding?: No Last menstrual period?: n  Gastrointestinal Nausea?: No Vomiting?: No Indigestion/heartburn?: No Diarrhea?: Yes Constipation?: No  Constitutional Fever: No Night sweats?: No Weight loss?: No Fatigue?: No  Skin Skin rash/lesions?: No Itching?: No  Eyes Blurred vision?: No Double vision?: No  Ears/Nose/Throat Sore throat?: No Sinus  problems?: No  Hematologic/Lymphatic Swollen glands?: No Easy bruising?: No  Cardiovascular Leg swelling?: Yes Chest pain?: No  Respiratory Cough?: No Shortness of breath?: No  Endocrine Excessive thirst?: No  Musculoskeletal Back pain?: Yes Joint pain?: Yes  Neurological Headaches?: Yes Dizziness?: No  Psychologic Depression?: No Anxiety?: No  Physical Exam: BP (!) 152/77   Pulse (!) 101   Ht 4\' 9"  (1.448 m)   Wt 130 lb (59 kg)   BMI 28.13 kg/m   Constitutional:  Alert and oriented, No acute distress.  Elderly.  In wheelchair.  Accompanied by siblings who are her caretakers. HEENT: [place] AT, moist mucus membranes.  Trachea midline, no masses. Cardiovascular: No clubbing, cyanosis, or edema. Respiratory: Normal respiratory effort, no increased  work of breathing. GI: Abdomen is soft, nontender, nondistended, no abdominal masses GU: No CVA tenderness.  Skin: No rashes, bruises or suspicious lesions. Neurologic: Grossly intact, no focal deficits, moving all 4 extremities. Psychiatric: Normal mood and affect.  Laboratory Data: Lab Results  Component Value Date   WBC 15.9 (H) 06/29/2017   HGB 12.0 06/29/2017   HCT 36.0 06/29/2017   MCV 84.2 06/29/2017   PLT 362 06/29/2017    Lab Results  Component Value Date   CREATININE 0.70 06/29/2017    Lab Results  Component Value Date   HGBA1C 7.5 (H) 01/25/2012    Urinalysis Results for orders placed or performed in visit on 08/24/17  Microscopic Examination  Result Value Ref Range   WBC, UA >30 (H) 0 - 5 /hpf   RBC, UA 0-2 0 - 2 /hpf   Epithelial Cells (non renal) None seen 0 - 10 /hpf   Mucus, UA Present (A) Not Estab.   Bacteria, UA Many (A) None seen/Few  Urinalysis, Complete  Result Value Ref Range   Specific Gravity, UA 1.020 1.005 - 1.030   pH, UA 5.0 5.0 - 7.5   Color, UA Yellow Yellow   Appearance Ur Cloudy (A) Clear   Leukocytes, UA 3+ (A) Negative   Protein, UA 1+ (A) Negative/Trace    Glucose, UA Negative Negative   Ketones, UA Negative Negative   RBC, UA 1+ (A) Negative   Bilirubin, UA Negative Negative   Urobilinogen, Ur 0.2 0.2 - 1.0 mg/dL   Nitrite, UA Positive (A) Negative   Microscopic Examination See below:     Pertinent Imaging: Results for orders placed during the hospital encounter of 04/13/16  CT Renal Stone Study   Narrative CLINICAL DATA:  urine culture results showed that patient needed to come to the hospital for IV antibiotics. Patient is complaining of back pain and dysuria. Patient states, "the pain is so bad I cry."  EXAM: CT ABDOMEN AND PELVIS WITHOUT CONTRAST  TECHNIQUE: Multidetector CT imaging of the abdomen and pelvis was performed following the standard protocol without IV contrast.  COMPARISON:  11/27/2012  FINDINGS: Lower chest: Mitral annulus calcifications. No pleural or pericardial effusion. Visualized lung bases clear.  Hepatobiliary: Previous cholecystectomy. Mild intra and extrahepatic biliary ductal dilatation, slightly more conspicuous than on prior study. No focal liver lesion on this unenhanced exam. Chronic focal calcification in segment 6.  Pancreas: Mild diffuse parenchymal atrophy without focal lesion or ductal dilatation.  Spleen: Within normal limits in size.  Adrenals/Urinary Tract: 2.7 cm left lower pole lesion measures near fluid attenuation, present since scans dating back to 09/14/2011 with mild enlargement. No nephrolithiasis. No hydronephrosis. Thick-walled urinary bladder, partially distended. Some gas in the urinary bladder presumably from recent instrumentation.  Stomach/Bowel: Small hiatal hernia. Stomach, small bowel, and colon are nondilated. A few small scattered distal descending and sigmoid diverticula without adjacent inflammatory/ edematous change. Fecal distention of the rectum.  Vascular/Lymphatic: Heavy aortoiliac arterial calcifications without aneurysm. No adenopathy  localized.  Reproductive: Previous hysterectomy.  Other: No ascites.  No free air.  Musculoskeletal: Mild spondylitic changes in the lower thoracic and lumbar spine. No fracture.  IMPRESSION: 1. Thickening of the wall of the urinary bladder suggesting cystitis. Intraluminal gas presumably secondary to recent instrumentation. 2. Fecal distention of the rectum without obstruction. 3. Descending and sigmoid diverticulosis. 4. Small hiatal hernia. 5. Left lower pole renal lesion, possibly benign cyst but incompletely characterized.   Electronically Signed   By: Eden Emms.D.  On: 04/13/2016 17:29    CT scan personally reviewed.  Assessment & Plan:   1. Recurrent UTI Multiple risk factors for recurrent UTIs including postmenopausal, fecal incontinence, urinary incontinence, poor hygiene practices, probable chronic constipation based on CT scan UA today appears suspicious-concerning for partially or incompletely treated UTI, will send culture and treat as needed No obvious anatomic issues contributing to recurrent infections including no kidney stones and adequate bladder emptying Extensive discussion today regarding behavioral modification and hygiene techniques Encouraged increase fluid intake Continue cranberry tabs, increase to twice daily and probiotic Would likely benefit from topical estrogen cream, discussed anatomy and application technique with sister who is willing to apply 3 times a week (no contraindications for this medication) Advised to return to our clinic with any symptoms of UTI for catheterized specimen, given age and functional status, it would be unlikely to provide an adequate sterile midstream collection thus will require straight cath for accurate diagnosis - Urinalysis, Complete - CULTURE, URINE COMPREHENSIVE - conjugated estrogens (PREMARIN) vaginal cream; Place 1 Applicatorful vaginally daily. Use pea sized amount M-W-Fr before bedtime  Dispense: 42.5  g; Refill: 12 - Microscopic Examination   F/u prn  Hollice Espy, MD  [facility] [address], [place], [place] [address] [phone]

## 2017-08-24 NOTE — Progress Notes (Signed)
In and Out Catheterization  Patient is present today for a I & O catheterization due to recurrent UTI. Patient was cleaned and prepped in a sterile fashion with betadine and Lidocaine 2% jelly was instilled into the urethra.  A 14 FR cath was inserted no complications were noted , 126ml of urine return was noted, urine was yellow and cloudy in color. A clean urine sample was collected for UA. Bladder was drained  And catheter was removed with out difficulty.    Preformed by: Fonnie Jarvis, CMA

## 2017-08-25 LAB — MICROSCOPIC EXAMINATION
EPITHELIAL CELLS (NON RENAL): NONE SEEN /HPF (ref 0–10)
WBC, UA: >30 /hpf — ABNORMAL HIGH (ref 0–?)

## 2017-08-25 LAB — URINALYSIS, COMPLETE
Bilirubin, UA: NEGATIVE
Glucose, UA: NEGATIVE
KETONES UA: NEGATIVE
NITRITE UA: POSITIVE — AB
PH UA: 5 (ref 5.0–7.5)
SPEC GRAV UA: 1.02 (ref 1.005–1.030)
Urobilinogen, Ur: 0.2 mg/dL (ref 0.2–1.0)

## 2017-08-27 LAB — CULTURE, URINE COMPREHENSIVE

## 2017-08-29 ENCOUNTER — Telehealth: Payer: Self-pay

## 2017-08-29 ENCOUNTER — Inpatient Hospital Stay: Payer: Medicare Other | Attending: Internal Medicine

## 2017-08-29 DIAGNOSIS — D500 Iron deficiency anemia secondary to blood loss (chronic): Secondary | ICD-10-CM | POA: Diagnosis present

## 2017-08-29 DIAGNOSIS — Z452 Encounter for adjustment and management of vascular access device: Secondary | ICD-10-CM | POA: Diagnosis not present

## 2017-08-29 DIAGNOSIS — D509 Iron deficiency anemia, unspecified: Secondary | ICD-10-CM | POA: Diagnosis not present

## 2017-08-29 DIAGNOSIS — Z95828 Presence of other vascular implants and grafts: Secondary | ICD-10-CM

## 2017-08-29 MED ORDER — HEPARIN SOD (PORK) LOCK FLUSH 100 UNIT/ML IV SOLN
500.0000 [IU] | INTRAVENOUS | Status: AC | PRN
  Administered 2017-08-29: 500 [IU]

## 2017-08-29 MED ORDER — SODIUM CHLORIDE 0.9% FLUSH
10.0000 mL | INTRAVENOUS | Status: AC | PRN
  Administered 2017-08-29: 10 mL
  Filled 2017-08-29: qty 10

## 2017-08-29 MED ORDER — NITROFURANTOIN MONOHYD MACRO 100 MG PO CAPS: 100.0000 mg | ORAL_CAPSULE | Freq: Two times a day (BID) | ORAL | 0 refills | Status: DC

## 2017-08-29 NOTE — Telephone Encounter (Signed)
Spoke with Eddie Dibbles, pt brother, in reference to ucx results and needing macrobid. Also made Eddie Dibbles aware will need to come in for a cath after completing abx. Eddie Dibbles voiced understanding.

## 2017-08-29 NOTE — Telephone Encounter (Signed)
-----   Message from Hollice Espy, MD sent at 08/29/2017  3:16 PM EST ----- Urine culture is still growing E. coli consistent with highly resistant organism.  Based on this result, it appears that it should be susceptible to Augmentin but my understanding is that she has tried this.  I would like her to try Macrobid twice daily times 10 days and then have a repeat straight cath culture.  If this remains positive, we need to refer to ID for IV antibiotics.  The meantime, if she becomes ill, more confused, febrile, she needs to go to the emergency room for IV abx.    Hollice Espy, MD

## 2017-09-11 ENCOUNTER — Encounter: Payer: Self-pay | Admitting: Emergency Medicine

## 2017-09-11 ENCOUNTER — Emergency Department
Admission: EM | Admit: 2017-09-11 | Discharge: 2017-09-11 | Disposition: A | Discharge status: Home / Self Care | Payer: Medicare Other | Attending: Emergency Medicine | Admitting: Emergency Medicine

## 2017-09-11 DIAGNOSIS — M545 Low back pain, unspecified: Secondary | ICD-10-CM

## 2017-09-11 DIAGNOSIS — Z79899 Other long term (current) drug therapy: Secondary | ICD-10-CM | POA: Diagnosis not present

## 2017-09-11 DIAGNOSIS — I129 Hypertensive chronic kidney disease with stage 1 through stage 4 chronic kidney disease, or unspecified chronic kidney disease: Secondary | ICD-10-CM | POA: Diagnosis not present

## 2017-09-11 DIAGNOSIS — Z794 Long term (current) use of insulin: Secondary | ICD-10-CM | POA: Diagnosis not present

## 2017-09-11 DIAGNOSIS — E1122 Type 2 diabetes mellitus with diabetic chronic kidney disease: Secondary | ICD-10-CM | POA: Diagnosis not present

## 2017-09-11 DIAGNOSIS — G8929 Other chronic pain: Secondary | ICD-10-CM | POA: Insufficient documentation

## 2017-09-11 DIAGNOSIS — F0280 Dementia in other diseases classified elsewhere without behavioral disturbance: Secondary | ICD-10-CM | POA: Insufficient documentation

## 2017-09-11 DIAGNOSIS — G301 Alzheimer's disease with late onset: Secondary | ICD-10-CM | POA: Insufficient documentation

## 2017-09-11 DIAGNOSIS — N3090 Cystitis, unspecified without hematuria: Secondary | ICD-10-CM | POA: Insufficient documentation

## 2017-09-11 DIAGNOSIS — N183 Chronic kidney disease, stage 3 (moderate): Secondary | ICD-10-CM | POA: Insufficient documentation

## 2017-09-11 LAB — CBC WITH DIFFERENTIAL/PLATELET
BASOS PCT: 1 %
Basophils Absolute: 0.1 10*3/uL (ref 0–0.1)
EOS ABS: 0.3 10*3/uL (ref 0–0.7)
EOS PCT: 2 %
HCT: 37.5 % (ref 35.0–47.0)
HEMOGLOBIN: 12.4 g/dL (ref 12.0–16.0)
Lymphocytes Relative: 26 %
Lymphs Abs: 3.3 10*3/uL (ref 1.0–3.6)
MCH: 27.7 pg (ref 26.0–34.0)
MCHC: 33.2 g/dL (ref 32.0–36.0)
MCV: 83.6 fL (ref 80.0–100.0)
MONOS PCT: 16 %
Monocytes Absolute: 2 10*3/uL — ABNORMAL HIGH (ref 0.2–0.9)
NEUTROS ABS: 6.8 10*3/uL — AB (ref 1.4–6.5)
Neutrophils Relative %: 55 %
PLATELETS: 388 10*3/uL (ref 150–440)
RBC: 4.48 MIL/uL (ref 3.80–5.20)
RDW: 17 % — ABNORMAL HIGH (ref 11.5–14.5)
WBC: 12.4 10*3/uL — AB (ref 3.6–11.0)

## 2017-09-11 LAB — COMPREHENSIVE METABOLIC PANEL
ALT: 7 U/L — AB (ref 14–54)
ANION GAP: 10 (ref 5–15)
AST: 21 U/L (ref 15–41)
Albumin: 2.6 g/dL — ABNORMAL LOW (ref 3.5–5.0)
Alkaline Phosphatase: 52 U/L (ref 38–126)
BUN: 15 mg/dL (ref 6–20)
CHLORIDE: 98 mmol/L — AB (ref 101–111)
CO2: 24 mmol/L (ref 22–32)
CREATININE: 0.73 mg/dL (ref 0.44–1.00)
Calcium: 8.6 mg/dL — ABNORMAL LOW (ref 8.9–10.3)
Glucose, Bld: 139 mg/dL — ABNORMAL HIGH (ref 65–99)
POTASSIUM: 3.5 mmol/L (ref 3.5–5.1)
SODIUM: 132 mmol/L — AB (ref 135–145)
Total Bilirubin: 0.6 mg/dL (ref 0.3–1.2)
Total Protein: 5.9 g/dL — ABNORMAL LOW (ref 6.5–8.1)

## 2017-09-11 LAB — URINALYSIS, COMPLETE (UACMP) WITH MICROSCOPIC
BILIRUBIN URINE: NEGATIVE
GLUCOSE, UA: NEGATIVE mg/dL
KETONES UR: NEGATIVE mg/dL
NITRITE: NEGATIVE
PROTEIN: 100 mg/dL — AB
Specific Gravity, Urine: 1.015 (ref 1.005–1.030)
Squamous Epithelial / LPF: NONE SEEN
pH: 5 (ref 5.0–8.0)

## 2017-09-11 LAB — LIPASE, BLOOD: LIPASE: 17 U/L (ref 11–51)

## 2017-09-11 MED ORDER — TRAMADOL HCL 50 MG PO TABS
50.0000 mg | ORAL_TABLET | Freq: Once | ORAL | Status: AC
  Administered 2017-09-11: 50 mg via ORAL
  Filled 2017-09-11: qty 1

## 2017-09-11 MED ORDER — SODIUM CHLORIDE 0.9 % IV BOLUS (SEPSIS)
1000.0000 mL | Freq: Once | INTRAVENOUS | Status: AC
  Administered 2017-09-11: 1000 mL via INTRAVENOUS

## 2017-09-11 MED ORDER — NITROFURANTOIN MONOHYD MACRO 100 MG PO CAPS: 100.0000 mg | ORAL_CAPSULE | Freq: Two times a day (BID) | ORAL | 0 refills | Status: DC

## 2017-09-11 NOTE — ED Triage Notes (Signed)
Patient presents to ED via [facility] from home (lives alone) with c/o back spasms. Patient was recently dx with a uti and has finished her course of antibiotics. Patient is currently taking tramadol but reports difficulty swallowing pills.

## 2017-09-11 NOTE — Discharge Instructions (Signed)
Continue macrobid indefinitely until you are able to see infectious disease clinic.  Follow up with your doctor for continued monitoring of your symptoms as well. Return to the ER if your condition worsens in any way, including fever, vomiting, uncontrollable pain, or other new concerns.

## 2017-09-11 NOTE — ED Notes (Signed)
Patient here for chronic back pain. Recently finished antibiotics for a uti.

## 2017-09-11 NOTE — ED Provider Notes (Signed)
[facility] Emergency Department Provider Note  ____________________________________________  Time seen: Approximately 7:34 PM  I have reviewed the triage vital signs and the nursing notes.   HISTORY  Chief Complaint Back Pain    HPI Cindy Sullivan is a 83 y.o. female who complains of dysuria for the past 2 days as well as chronic low back pain. She is taking tramadol prescribed by her primary care doctor for the back pain which is effective today she felt like she couldn't swallow pills and so she hasn't taken her pain medicine in a while. No new injuries. No urinary incontinence or saddle anesthesia or lower extremity weakness. She has a history of chronic UTIs as well as hypertension. She's been followed by urology for her UTI which is resistant to multiple antibiotics. Recently completed a course of Macrobid which was shown on culture to be efficacious about 5 days ago. Her symptoms resolved during that course, but now have relapse. Plan per urology notes is to refer her to infectious disease if the infection was not eradicated by Macrobid.  Back pain is constant, waxing and waning, moderate intensity, sharp, worse with any movement or change in position. Nonradiating.       Past Medical History:  Diagnosis Date  . Chest pain syndrome   . Chronic back pain   . Chronic diarrhea   . GERD (gastroesophageal reflux disease)   . H/O: GI bleed   . Hyperlipidemia   . Hypertension   . IDA (iron deficiency anemia)   . Insomnia   . Non-insulin dependent type 2 diabetes mellitus ([place])   . Pancreatitis      Patient Active Problem List   Diagnosis Date Noted  . Sepsis ([place]) 06/27/2017  . SIRS (systemic inflammatory response syndrome) ([place]) 04/30/2017  . Type 2 diabetes mellitus ([place]) 01/18/2016  . Alzheimer's dementia, late onset 01/18/2016  . Iron deficiency anemia due to chronic blood loss 12/30/2015  . Chronic hyponatremia 04/07/2015  . Chronic kidney  disease (CKD), stage III (moderate) ([place]) 04/07/2015  . Essential (primary) hypertension 04/07/2015  . Chronic diarrhea 02/03/2015  . Insomnia, persistent 02/02/2015  . Primary osteoarthritis of both knees 02/02/2015  . Pure hypercholesterolemia 09/12/2014     Past Surgical History:  Procedure Laterality Date  . APPENDECTOMY    . CHOLECYSTECTOMY    . EXPLORATORY LAPAROTOMY    . HERNIA REPAIR     x 2  . KNEE ARTHROSCOPY Left   . TOTAL ABDOMINAL HYSTERECTOMY W/ BILATERAL SALPINGOOPHORECTOMY     one ovary still left     Prior to Admission medications   Medication Sig Start Date End Date Taking? Authorizing Provider  acetaminophen (TYLENOL) 500 MG tablet Take 500 mg by mouth 2 (two) times daily.     [provider]  atorvastatin (LIPITOR) 20 MG tablet Take 20 mg by mouth at bedtime.     [provider]  conjugated estrogens (PREMARIN) vaginal cream Place 1 Applicatorful vaginally daily. Use pea sized amount M-W-Fr before bedtime 08/24/17   Hollice Espy, MD  insulin NPH-regular Human (NOVOLIN 70/30) (70-30) 100 UNIT/ML injection Inject 3-25 Units into the skin 2 (two) times daily. Pt uses 20-25 units in the morning. and 3-5 units in the evening.    [provider]  insulin regular (NOVOLIN R,HUMULIN R) 100 units/mL injection Inject 2-10 Units into the skin 2 (two) times daily before a meal. Pt uses as needed per sliding scale:    151-200:  2 units  201-250:  4 units 251-300:  6 units  301-350:  8 units 351-400:  10 units    [provider]  irbesartan (AVAPRO) 150 MG tablet Take 150 mg by mouth daily.    [provider]  memantine (NAMENDA) 5 MG tablet Take 5 mg by mouth 2 (two) times daily.     [provider]  nitrofurantoin, macrocrystal-monohydrate, (MACROBID) 100 MG capsule Take 1 capsule (100 mg total) every 12 (twelve) hours by mouth. 09/11/17   Carrie Mew, MD  omeprazole (PRILOSEC) 40 MG capsule Take 40 mg by  mouth daily.     [provider]  oxybutynin (DITROPAN-XL) 5 MG 24 hr tablet Take 5 mg by mouth 2 (two) times daily.     [provider]  traMADol (ULTRAM) 50 MG tablet Take 1 tablet (50 mg total) by mouth every 6 (six) hours as needed for moderate pain. 04/16/16   Dustin Flock, MD     Allergies Ciprofloxacin; Codeine; Escitalopram oxalate; Metronidazole; Other; Pantoprazole; Phenergan [promethazine hcl]; and Sulfamethoxazole-trimethoprim   Family History  Problem Relation Age of Onset  . Prostate cancer Neg Hx   . Bladder Cancer Neg Hx   . Kidney cancer Neg Hx     Social History Social History   Tobacco Use  . Smoking status: Never Smoker  . Smokeless tobacco: Never Used  Substance Use Topics  . Alcohol use: No    Alcohol/week: 0.0 oz  . Drug use: No    Review of Systems  Constitutional:   No fever or chills.  ENT:   No sore throat. No rhinorrhea. Cardiovascular:   No chest pain or syncope. Respiratory:   No dyspnea or cough. Gastrointestinal:   Negative for abdominal pain, vomiting and diarrhea.  Musculoskeletal:   Positive left low back pain as above All other systems reviewed and are negative except as documented above in ROS and HPI.  ____________________________________________   PHYSICAL EXAM:  VITAL SIGNS: ED Triage Vitals  Enc Vitals Group     BP 09/11/17 1741 (!) 177/65     Pulse Rate 09/11/17 1741 93     Resp 09/11/17 1741 16     Temp 09/11/17 1741 97.9 F (36.6 C)     Temp Source 09/11/17 1741 Oral     SpO2 09/11/17 1741 94 %     Weight 09/11/17 1742 130 lb (59 kg)     Height 09/11/17 1742 4\' 9"  (1.448 m)     Head Circumference --      Peak Flow --      Pain Score 09/11/17 1741 10     Pain Loc --      Pain Edu? --      Excl. in [place]? --     Vital signs reviewed, nursing assessments reviewed.   Constitutional:   Alert and oriented. Well appearing and in no distress. Eyes:   No scleral icterus.  EOMI. No nystagmus. No  conjunctival pallor. PERRL. ENT   Head:   Normocephalic and atraumatic.   Nose:   No congestion/rhinnorhea.    Mouth/Throat:   MMM, no pharyngeal erythema. No peritonsillar mass.    Neck:   No meningismus. Full ROM. Hematological/Lymphatic/Immunilogical:   No cervical lymphadenopathy. Cardiovascular:   RRR. Symmetric bilateral radial and DP pulses.  No murmurs.  Respiratory:   Normal respiratory effort without tachypnea/retractions. Breath sounds are clear and equal bilaterally. No wheezes/rales/rhonchi. Gastrointestinal:   Soft with diffuse lower abdominal tenderness, worse in the suprapubic area.. Non distended. There is no  CVA tenderness.  No rebound, rigidity, or guarding. Genitourinary:   deferred Musculoskeletal:   Normal range of motion in all extremities. No joint effusions.  No lower extremity tenderness.  No edema. There is tenderness in the left lower back musculature, which reproduces her back pain symptoms. Neurologic:   Normal speech and language.  Motor grossly intact. No gross focal neurologic deficits are appreciated.  Skin:    Skin is warm, dry and intact. No rash noted.  No petechiae, purpura, or bullae.  ____________________________________________    LABS (pertinent positives/negatives) (all labs ordered are listed, but only abnormal results are displayed) Labs Reviewed  CBC WITH DIFFERENTIAL/PLATELET - Abnormal; Notable for the following components:      Result Value   WBC 12.4 (*)    RDW 17.0 (*)    Neutro Abs 6.8 (*)    Monocytes Absolute 2.0 (*)    All other components within normal limits  COMPREHENSIVE METABOLIC PANEL - Abnormal; Notable for the following components:   Sodium 132 (*)    Chloride 98 (*)    Glucose, Bld 139 (*)    Calcium 8.6 (*)    Total Protein 5.9 (*)    Albumin 2.6 (*)    ALT 7 (*)    All other components within normal limits  URINALYSIS, COMPLETE (UACMP) WITH MICROSCOPIC - Abnormal; Notable for the following  components:   Color, Urine AMBER (*)    APPearance TURBID (*)    Hgb urine dipstick SMALL (*)    Protein, ur 100 (*)    Leukocytes, UA LARGE (*)    Bacteria, UA FEW (*)    All other components within normal limits  URINE CULTURE  LIPASE, BLOOD   ____________________________________________   EKG    ____________________________________________    RADIOLOGY  No results found.  ____________________________________________   PROCEDURES Procedures  ____________________________________________     CLINICAL IMPRESSION / ASSESSMENT AND PLAN / ED COURSE  Pertinent labs & imaging results that were available during my care of the patient were reviewed by me and considered in my medical decision making (see chart for details).     Clinical Course as of Sep 11 1933  Nancy Fetter Sep 11, 2017  1850 Patient presents with low back pain which appears to be musculoskeletal, but also with lower abdominal tenderness, likely recurrence of her chronic resistant UTI. We'll check labs, given IV fluids. If labs neg, will need CT a/p due to abd tenderness.   [PS]  1929 Labs reveal persistent UTI. Symptoms were improved by Macrobid even though the infection was not you eradicated. Repeat culture sent today. No evidence of pyelonephritis or sepsis. No fever, unremarkable vital signs, normal creatinine and white count. Not requiring hospitalization at this time. I'll continue her on Macrobid for suppression while she follows up with ID as previously planned under this scenario by urology  [PS]    Clinical Course User Index [PS] Carrie Mew, MD     ____________________________________________   FINAL CLINICAL IMPRESSION(S) / ED DIAGNOSES    Final diagnoses:  Acute left-sided low back pain without sciatica  Cystitis      This SmartLink is deprecated. Use AVSMEDLIST instead to display the medication list for a patient.   Portions of this note were generated with dragon dictation  software. Dictation errors may occur despite best attempts at proofreading.    Carrie Mew, MD 09/11/17 [phone]

## 2017-09-11 NOTE — ED Notes (Signed)
In to see pt due to family calling out about possible infiltrate. Pt c/o arm hurting but then says it is her back. Pt c/o her robe under her back hurting her. Pt rolled and robe removed. Pt then says her arm isn't comfortable. Towel placed under her arm to assist with position. Family concerned that the IV isn't running. IV is infusing well. Explained to family that the dripping my pause with arm position but it is ok. MD in with pt.

## 2017-09-12 ENCOUNTER — Ambulatory Visit: Payer: Self-pay

## 2017-09-12 ENCOUNTER — Telehealth: Payer: Self-pay

## 2017-09-12 NOTE — Telephone Encounter (Signed)
Pt brother, Eddie Dibbles, called stating pt was seen in the ER yesterday for a UTI. Pt was supposed to be seen today for a cath urine recheck. But Eddie Dibbles stated that ER physician started pt back on macrobid x20 days. Eddie Dibbles stated that ER gave pt a referral to ID and they are waiting to hear back for the appt.

## 2017-09-14 LAB — URINE CULTURE

## 2017-09-15 NOTE — Progress Notes (Addendum)
ED CULTURE REPORT  83yo female seen in ED on 11/18 with symptoms of dysuria and chronic lower back pain. Patient has a history of chronic UTIs that were Ecoli and was discharged with nitrofurantoin for 20 days. On 11/22 her urine cultures resulted showing pseudomonas aeruginosa. I called the patient to determine if she was still having symptoms and spoke with her daughter Tye Maryland who expressed that her mother was still in a lot of pain and having UTI symptoms. Additionally, the patient has many antibiotic allergies and there are no oral antibiotics available to treat her current infection.I relayed this information to ED MD Dr. Archie Balboa who agreed that patient would need IV antibiotics. Dr. Archie Balboa then called the patient's daughter to convey this information but could not guarantee that upon arrival to ED that she would be admitted after evaluating her per [facility] and Dr Archie Balboa. Tye Maryland also requested that I speak with her again. I called Tye Maryland back and confirmed that there is a note in the patient's chart from 11/19 by Toniann Fail, NP stating that patient was being referred to ID clinic. I then gave her the number [phone] to ID clinic so that she could follow up in the morning and encouraged her to come to the ED for evaluation.   11/23 followed up with daughter to see if they were able to get in touch with the clinic. Daughter responded that they were not. Again encouraged daughter to consider bringing patient to ED due to worsening condition of the patient.    Results for orders placed or performed during the hospital encounter of 09/11/17  Urine culture     Status: Abnormal   Collection Time: 09/11/17  6:24 PM  Result Value Ref Range Status   Specimen Description URINE, RANDOM  Final   Special Requests NONE  Final   Culture >=100,000 COLONIES/mL PSEUDOMONAS AERUGINOSA (A)  Final   Report Status 09/14/2017 FINAL  Final   Organism ID, Bacteria PSEUDOMONAS AERUGINOSA (A)  Final   Susceptibility   Pseudomonas aeruginosa - MIC*    CEFTAZIDIME 4 SENSITIVE Sensitive     CIPROFLOXACIN 0.5 SENSITIVE Sensitive     GENTAMICIN 4 SENSITIVE Sensitive     IMIPENEM 2 SENSITIVE Sensitive     PIP/TAZO 8 SENSITIVE Sensitive     CEFEPIME 8 SENSITIVE Sensitive     * >=100,000 COLONIES/mL PSEUDOMONAS AERUGINOSA     Lendon Ka, PharmD Pharmacy Resident

## 2017-09-15 NOTE — ED Provider Notes (Signed)
Spoke with patient's daughter about urine culture. Did recommend that patient return to the emergency department for further evaluation and probable IV antibiotics.    Nance Pear, MD 09/15/17 [phone]

## 2017-09-17 ENCOUNTER — Other Ambulatory Visit: Payer: Self-pay

## 2017-09-17 ENCOUNTER — Encounter: Payer: Self-pay | Admitting: Emergency Medicine

## 2017-09-17 ENCOUNTER — Inpatient Hospital Stay
Admission: EM | Admit: 2017-09-17 | Discharge: 2017-09-20 | DRG: 690 | Disposition: A | Discharge status: Skilled Nursing Facility | Payer: Medicare Other | Attending: Internal Medicine | Admitting: Internal Medicine

## 2017-09-17 DIAGNOSIS — F0280 Dementia in other diseases classified elsewhere without behavioral disturbance: Secondary | ICD-10-CM | POA: Diagnosis present

## 2017-09-17 DIAGNOSIS — Z90721 Acquired absence of ovaries, unilateral: Secondary | ICD-10-CM

## 2017-09-17 DIAGNOSIS — B965 Pseudomonas (aeruginosa) (mallei) (pseudomallei) as the cause of diseases classified elsewhere: Secondary | ICD-10-CM | POA: Diagnosis present

## 2017-09-17 DIAGNOSIS — E878 Other disorders of electrolyte and fluid balance, not elsewhere classified: Secondary | ICD-10-CM | POA: Diagnosis present

## 2017-09-17 DIAGNOSIS — M170 Bilateral primary osteoarthritis of knee: Secondary | ICD-10-CM | POA: Diagnosis present

## 2017-09-17 DIAGNOSIS — Z79890 Hormone replacement therapy (postmenopausal): Secondary | ICD-10-CM

## 2017-09-17 DIAGNOSIS — E871 Hypo-osmolality and hyponatremia: Secondary | ICD-10-CM | POA: Diagnosis present

## 2017-09-17 DIAGNOSIS — G8929 Other chronic pain: Secondary | ICD-10-CM | POA: Diagnosis present

## 2017-09-17 DIAGNOSIS — E118 Type 2 diabetes mellitus with unspecified complications: Secondary | ICD-10-CM

## 2017-09-17 DIAGNOSIS — M549 Dorsalgia, unspecified: Secondary | ICD-10-CM | POA: Diagnosis present

## 2017-09-17 DIAGNOSIS — E785 Hyperlipidemia, unspecified: Secondary | ICD-10-CM | POA: Diagnosis present

## 2017-09-17 DIAGNOSIS — N12 Tubulo-interstitial nephritis, not specified as acute or chronic: Secondary | ICD-10-CM | POA: Diagnosis not present

## 2017-09-17 DIAGNOSIS — Z9049 Acquired absence of other specified parts of digestive tract: Secondary | ICD-10-CM

## 2017-09-17 DIAGNOSIS — E119 Type 2 diabetes mellitus without complications: Secondary | ICD-10-CM | POA: Diagnosis present

## 2017-09-17 DIAGNOSIS — Z66 Do not resuscitate: Secondary | ICD-10-CM | POA: Diagnosis present

## 2017-09-17 DIAGNOSIS — Z87440 Personal history of urinary (tract) infections: Secondary | ICD-10-CM

## 2017-09-17 DIAGNOSIS — G301 Alzheimer's disease with late onset: Secondary | ICD-10-CM | POA: Diagnosis present

## 2017-09-17 DIAGNOSIS — I10 Essential (primary) hypertension: Secondary | ICD-10-CM | POA: Diagnosis present

## 2017-09-17 DIAGNOSIS — Z885 Allergy status to narcotic agent status: Secondary | ICD-10-CM

## 2017-09-17 DIAGNOSIS — Z794 Long term (current) use of insulin: Secondary | ICD-10-CM

## 2017-09-17 DIAGNOSIS — Z882 Allergy status to sulfonamides status: Secondary | ICD-10-CM

## 2017-09-17 DIAGNOSIS — Z881 Allergy status to other antibiotic agents status: Secondary | ICD-10-CM

## 2017-09-17 DIAGNOSIS — Z90710 Acquired absence of both cervix and uterus: Secondary | ICD-10-CM

## 2017-09-17 DIAGNOSIS — Z888 Allergy status to other drugs, medicaments and biological substances status: Secondary | ICD-10-CM

## 2017-09-17 DIAGNOSIS — Z87892 Personal history of anaphylaxis: Secondary | ICD-10-CM | POA: Diagnosis not present

## 2017-09-17 DIAGNOSIS — K219 Gastro-esophageal reflux disease without esophagitis: Secondary | ICD-10-CM | POA: Diagnosis present

## 2017-09-17 DIAGNOSIS — Z79899 Other long term (current) drug therapy: Secondary | ICD-10-CM

## 2017-09-17 DIAGNOSIS — N10 Acute tubulo-interstitial nephritis: Principal | ICD-10-CM | POA: Diagnosis present

## 2017-09-17 DIAGNOSIS — Z9079 Acquired absence of other genital organ(s): Secondary | ICD-10-CM

## 2017-09-17 DIAGNOSIS — N390 Urinary tract infection, site not specified: Secondary | ICD-10-CM | POA: Diagnosis present

## 2017-09-17 LAB — URINALYSIS, COMPLETE (UACMP) WITH MICROSCOPIC
BILIRUBIN URINE: NEGATIVE
Glucose, UA: NEGATIVE mg/dL
Ketones, ur: NEGATIVE mg/dL
NITRITE: NEGATIVE
PROTEIN: 30 mg/dL — AB
Specific Gravity, Urine: 1.01 (ref 1.005–1.030)
pH: 6 (ref 5.0–8.0)

## 2017-09-17 LAB — CBC
HEMATOCRIT: 38.1 % (ref 35.0–47.0)
Hemoglobin: 12.6 g/dL (ref 12.0–16.0)
MCH: 27.8 pg (ref 26.0–34.0)
MCHC: 33 g/dL (ref 32.0–36.0)
MCV: 84.4 fL (ref 80.0–100.0)
PLATELETS: 390 10*3/uL (ref 150–440)
RBC: 4.51 MIL/uL (ref 3.80–5.20)
RDW: 16.5 % — AB (ref 11.5–14.5)
WBC: 12.7 10*3/uL — AB (ref 3.6–11.0)

## 2017-09-17 LAB — BASIC METABOLIC PANEL
ANION GAP: 9 (ref 5–15)
BUN: 13 mg/dL (ref 6–20)
CALCIUM: 8.9 mg/dL (ref 8.9–10.3)
CO2: 24 mmol/L (ref 22–32)
Chloride: 97 mmol/L — ABNORMAL LOW (ref 101–111)
Creatinine, Ser: 0.9 mg/dL (ref 0.44–1.00)
GFR calc non Af Amer: 56 mL/min — ABNORMAL LOW (ref >60)
Glucose, Bld: 113 mg/dL — ABNORMAL HIGH (ref 65–99)
Potassium: 4.4 mmol/L (ref 3.5–5.1)
Sodium: 130 mmol/L — ABNORMAL LOW (ref 135–145)

## 2017-09-17 LAB — GLUCOSE, CAPILLARY: Glucose-Capillary: 84 mg/dL (ref 65–99)

## 2017-09-17 MED ORDER — INSULIN ASPART 100 UNIT/ML ~~LOC~~ SOLN: 0.0000 [IU] | Freq: Every day | SUBCUTANEOUS | Status: DC

## 2017-09-17 MED ORDER — FLAVOXATE HCL 100 MG PO TABS
100.0000 mg | ORAL_TABLET | Freq: Once | ORAL | Status: DC
  Filled 2017-09-17: qty 1

## 2017-09-17 MED ORDER — DEXTROSE 5 % IV SOLN: 500.0000 mg | Freq: Three times a day (TID) | INTRAVENOUS | Status: DC

## 2017-09-17 MED ORDER — HYDROCODONE-ACETAMINOPHEN 5-325 MG PO TABS
1.0000 | ORAL_TABLET | Freq: Three times a day (TID) | ORAL | Status: DC | PRN
  Administered 2017-09-18 – 2017-09-20 (×6): 1 via ORAL
  Filled 2017-09-17 (×6): qty 1

## 2017-09-17 MED ORDER — ONDANSETRON HCL 4 MG/2ML IJ SOLN
4.0000 mg | Freq: Once | INTRAMUSCULAR | Status: AC
  Administered 2017-09-17: 4 mg via INTRAVENOUS
  Filled 2017-09-17: qty 2

## 2017-09-17 MED ORDER — INSULIN ASPART 100 UNIT/ML ~~LOC~~ SOLN
0.0000 [IU] | Freq: Three times a day (TID) | SUBCUTANEOUS | Status: DC
  Administered 2017-09-18 – 2017-09-20 (×3): 1 [IU] via SUBCUTANEOUS
  Filled 2017-09-17 (×3): qty 1

## 2017-09-17 MED ORDER — SODIUM CHLORIDE 0.9 % IV SOLN
1.0000 g | Freq: Two times a day (BID) | INTRAVENOUS | Status: DC
  Administered 2017-09-17 – 2017-09-20 (×6): 1 g via INTRAVENOUS
  Filled 2017-09-17 (×7): qty 1

## 2017-09-17 MED ORDER — MORPHINE SULFATE (PF) 4 MG/ML IV SOLN
4.0000 mg | Freq: Once | INTRAVENOUS | Status: AC
Start: 2017-09-17 — End: 2017-09-17
  Administered 2017-09-17: 4 mg via INTRAVENOUS
  Filled 2017-09-17: qty 1

## 2017-09-17 NOTE — ED Triage Notes (Addendum)
Pt here last weekend, has recurrent UTI. Pt changed to new atbx to treat for pseudomonas.  Pt needs IV atbx per hospital pharmacist on Thanksgiving as well as Dr. Archie Balboa.  HOwever, daughter states she did not want to bring the patient back unless she was guaranteed admission.  Daughter states the patient has worsened since Thursday.  Pt was supposed to go back to PCP for clean cath UA, but did not go to appt.  Pt sees Dr. Erlene Quan at urology.  Was told to see Dr. Ola Spurr but daughter states his office said they do not manage chronic UTIs.  Pt has been c/o abdominal pain and worse pain when urinating.

## 2017-09-17 NOTE — ED Notes (Signed)
Pharmacy called and notified of need for urispas and meropenem.

## 2017-09-17 NOTE — ED Notes (Signed)
D/w Dr. Archie Balboa, order protocols for UTI.  Attempted IV access x 2, unsuccessful.  Called lab to draw labs while patient waiting.  Pt yells out randomly that she wants to go home and that she hurts "all over"  Pt and family escorted to family waiting informed them that the patient will be waiting for a room and lab will come to draw labs when they are available.

## 2017-09-17 NOTE — ED Notes (Signed)
Lab with patient to draw labs.

## 2017-09-17 NOTE — ED Notes (Signed)
Pt's daughter states she was called by a pharmacist named Earnest Bailey from the hospital on Thursday and told to bring her mother to the emergency department for admission. Pt's daughter states she was told by [facility] that the antibiotic pt was prescribed while in the Ed on Sunday "should have never been given to her". Pt's daughter states she was called the emergency department, spoke with dr. Archie Balboa "who couldn't help me and would not say if he would admit her or not". Pt's daughter states she did not want to come on Thanksgiving "because it was a holiday and I didn't want to sit up there all night". Pt's daughter states today they spoke with an rn at pt's urologist office "who told us get her up there right away". Per pt's daughter pt has been progressively "getting worse and something must be done about it". Pt with pale, dry skin, pt is alert and oriented x3, moves all extremities without difficulty.

## 2017-09-17 NOTE — ED Provider Notes (Signed)
[name] Emergency Department Provider Note  ____________________________________________   First MD Initiated Contact with Patient 09/17/17 2237     (approximate)  I have reviewed the triage vital signs and the nursing notes.   HISTORY  Chief Complaint Recurrent UTI   HPI Cindy Sullivan is a 83 y.o. female history obtained primarily from the patient's daughter at bedside.  The patient is an 19 year old woman with a past medical history of multiple urinary tract infections that have been E. coli, Klebsiella, and Pseudomonas who comes to the emergency department after being called back for positive urine culture for pseudomonas aeruginosa that was taken 6 days ago on November 18.  She was seen in the emergency department that time for abdominal pain dysuria and urinary spasm.  She was prescribed Macrobid which is not improved her symptoms.  2 days ago the culture came back showing Pseudomonas that was sensitive to multiple antibiotics but the only oral agent was ciprofloxacin which the patient is allergic to.  She was called back to the hospital by Dr. Archie Balboa, however family did not want to leave during Thanksgiving so they waited until today.  The patient now has severe intermittent cramping bladder spasms and back pain.  No fevers or chills.  Nothing seems to make the symptoms better or worse.  She reports anaphylaxis to ciprofloxacin.  Past Medical History:  Diagnosis Date  . Chest pain syndrome   . Chronic back pain   . Chronic diarrhea   . GERD (gastroesophageal reflux disease)   . H/O: GI bleed   . Hyperlipidemia   . Hypertension   . IDA (iron deficiency anemia)   . Insomnia   . Non-insulin dependent type 2 diabetes mellitus ([place])   . Pancreatitis     Patient Active Problem List   Diagnosis Date Noted  . Sepsis ([place]) 06/27/2017  . SIRS (systemic inflammatory response syndrome) ([place]) 04/30/2017  . Type 2 diabetes mellitus ([place]) 01/18/2016  .  Alzheimer's dementia, late onset 01/18/2016  . Iron deficiency anemia due to chronic blood loss 12/30/2015  . Chronic hyponatremia 04/07/2015  . Chronic kidney disease (CKD), stage III (moderate) ([place]) 04/07/2015  . Essential (primary) hypertension 04/07/2015  . Chronic diarrhea 02/03/2015  . Insomnia, persistent 02/02/2015  . Primary osteoarthritis of both knees 02/02/2015  . Pure hypercholesterolemia 09/12/2014    Past Surgical History:  Procedure Laterality Date  . APPENDECTOMY    . CHOLECYSTECTOMY    . EXPLORATORY LAPAROTOMY    . HERNIA REPAIR     x 2  . KNEE ARTHROSCOPY Left   . TOTAL ABDOMINAL HYSTERECTOMY W/ BILATERAL SALPINGOOPHORECTOMY     one ovary still left    Prior to Admission medications   Medication Sig Start Date End Date Taking? Authorizing Provider  acetaminophen (TYLENOL) 500 MG tablet Take 500 mg by mouth 2 (two) times daily.    Yes [provider]  atorvastatin (LIPITOR) 20 MG tablet Take 20 mg by mouth at bedtime.    Yes [provider]  conjugated estrogens (PREMARIN) vaginal cream Place 1 Applicatorful vaginally daily. Use pea sized amount M-W-Fr before bedtime 08/24/17  Yes Hollice Espy, MD  insulin NPH-regular Human (NOVOLIN 70/30) (70-30) 100 UNIT/ML injection Inject 3-25 Units into the skin 2 (two) times daily. Pt uses 12 units in the morning. and 3 units in the evening.   Yes [provider]  insulin regular (NOVOLIN R,HUMULIN R) 100 units/mL injection Inject 2-10 Units into the skin as needed for  high blood sugar. Pt uses as needed per sliding scale:    151-200:  2 units  201-250:  4 units 251-300:  6 units  301-350:  8 units 351-400:  10 units   Yes [provider]  memantine (NAMENDA) 5 MG tablet Take 5 mg by mouth 2 (two) times daily.    Yes [provider]  omeprazole (PRILOSEC) 40 MG capsule Take 40 mg by mouth daily.    Yes [provider]  oxybutynin (DITROPAN-XL) 5 MG 24 hr tablet  Take 5 mg by mouth 2 (two) times daily.    Yes [provider]  traMADol (ULTRAM) 50 MG tablet Take 1 tablet (50 mg total) by mouth every 6 (six) hours as needed for moderate pain. 04/16/16  Yes Dustin Flock, MD  irbesartan (AVAPRO) 150 MG tablet Take 150 mg by mouth daily.    [provider]  nitrofurantoin, macrocrystal-monohydrate, (MACROBID) 100 MG capsule Take 1 capsule (100 mg total) every 12 (twelve) hours by mouth. Patient not taking: Reported on 09/17/2017 09/11/17   Carrie Mew, MD    Allergies Ciprofloxacin; Codeine; Escitalopram oxalate; Metronidazole; Other; Pantoprazole; Phenergan [promethazine hcl]; and Sulfamethoxazole-trimethoprim  Family History  Problem Relation Age of Onset  . Prostate cancer Neg Hx   . Bladder Cancer Neg Hx   . Kidney cancer Neg Hx     Social History Social History   Tobacco Use  . Smoking status: Never Smoker  . Smokeless tobacco: Never Used  Substance Use Topics  . Alcohol use: No    Alcohol/week: 0.0 oz  . Drug use: No    Review of Systems Constitutional: No fever/chills Eyes: No visual changes. ENT: No sore throat. Cardiovascular: Denies chest pain. Respiratory: Denies shortness of breath. Gastrointestinal: Positive for abdominal pain.  Positive for nausea, no vomiting.  No diarrhea.  No constipation. Genitourinary: Positive for dysuria. Musculoskeletal: Positive for back pain. Skin: Negative for rash. Neurological: Negative for headaches, focal weakness or numbness.   ____________________________________________   PHYSICAL EXAM:  VITAL SIGNS: ED Triage Vitals  Enc Vitals Group     BP 09/17/17 1826 122/70     Pulse Rate 09/17/17 1826 91     Resp 09/17/17 1826 18     Temp 09/17/17 1826 98.5 F (36.9 C)     Temp Source 09/17/17 1826 Oral     SpO2 09/17/17 1826 94 %     Weight 09/17/17 1826 130 lb (59 kg)     Height 09/17/17 1826 4\' 9"  (1.448 m)     Head Circumference --      Peak Flow --       Pain Score 09/17/17 1825 5     Pain Loc --      Pain Edu? --      Excl. in [place]? --     Constitutional: Alert and oriented x4 hard of hearing uncomfortable appearing moaning in pain Eyes: PERRL EOMI. Head: Atraumatic. Nose: No congestion/rhinnorhea. Mouth/Throat: No trismus Neck: No stridor.   Cardiovascular: Normal rate, regular rhythm. Grossly normal heart sounds.  Good peripheral circulation. Respiratory: Normal respiratory effort.  No retractions. Lungs CTAB and moving good air Gastrointestinal: Soft mild diffusely tender abdomen with no focality no rebound or guarding no peritonitis mild costovertebral tenderness bilaterally Musculoskeletal: No lower extremity edema   Neurologic:  Normal speech and language. No gross focal neurologic deficits are appreciated. Skin:  Skin is warm, dry and intact. No rash noted. Psychiatric: Mood and affect are normal. Speech and behavior are  normal.    ____________________________________________   DIFFERENTIAL includes but not limited to  Urinary tract infection, pyelonephritis, sepsis, metabolic derangement ____________________________________________   LABS (all labs ordered are listed, but only abnormal results are displayed)  Labs Reviewed  URINALYSIS, COMPLETE (UACMP) WITH MICROSCOPIC - Abnormal; Notable for the following components:      Result Value   Color, Urine YELLOW (*)    APPearance CLOUDY (*)    Hgb urine dipstick SMALL (*)    Protein, ur 30 (*)    Leukocytes, UA LARGE (*)    Bacteria, UA RARE (*)    Squamous Epithelial / LPF 0-5 (*)    All other components within normal limits  CBC - Abnormal; Notable for the following components:   WBC 12.7 (*)    RDW 16.5 (*)    All other components within normal limits  BASIC METABOLIC PANEL - Abnormal; Notable for the following components:   Sodium 130 (*)    Chloride 97 (*)    Glucose, Bld 113 (*)    GFR calc non Af Amer 56 (*)    All other components within normal limits    URINE CULTURE  GLUCOSE, CAPILLARY  CBG MONITORING, ED    Lab work reviewed by me shows evidence of urinary tract infection with slightly elevated white count concerning for infection __________________________________________  EKG   ____________________________________________  RADIOLOGY   ____________________________________________   PROCEDURES  Procedure(s) performed: no  Procedures  Critical Care performed: no  Observation: no ____________________________________________   INITIAL IMPRESSION / ASSESSMENT AND PLAN / ED COURSE  Pertinent labs & imaging results that were available during my care of the patient were reviewed by me and considered in my medical decision making (see chart for details).  On arrival the patient is very uncomfortable appearing with significant bladder spasm.  I will give her Urispas as well as morphine for pain.  Her urine today is clearly still infected.  The only oral antibiotic available to treat the patient's symptoms of ciprofloxacin for which she is anaphylactic.  She also has progressed and has some costovertebral tenderness which could represent early pyelonephritis.  At this point she requires inpatient admission for IV antibiotics and I will be giving her meropenem now.  I discussed the case with the family and the patient who verbalized understanding and agreement.  I then discussed with the hospitalist Dr. Jerelyn Charles who is graciously agreed to admit the patient to his service.      ____________________________________________   FINAL CLINICAL IMPRESSION(S) / ED DIAGNOSES  Final diagnoses:  Pyelonephritis      NEW MEDICATIONS STARTED DURING THIS VISIT:  This SmartLink is deprecated. Use AVSMEDLIST instead to display the medication list for a patient.   Note:  This document was prepared using Dragon voice recognition software and may include unintentional dictation errors.     Darel Hong, MD 09/17/17 2317

## 2017-09-17 NOTE — H&P (Signed)
[facility] [place] NAME: Cindy Sullivan    MR#:  [id number]  DATE OF BIRTH:  January 10, 1929  DATE OF ADMISSION:  09/17/2017  PRIMARY CARE PHYSICIAN: Cindy Axe, MD   REQUESTING/REFERRING PHYSICIAN:   CHIEF COMPLAINT:   Chief Complaint  Patient presents with  . Recurrent UTI    HISTORY OF PRESENT ILLNESS: Cindy Sullivan  is a 83 y.o. female with a known history per below presented to the emergency room for IV antibiotics, patient was called back for Pseudomonas UTI only sensitive to IV antibiotics-patient with allergy to Cipro, patient with one-week history of worsening urinary frequency, pain with urination, lower abdominal pain/discomfort, patient nontoxic-appearing on examination, daughter at the bedside, patient is now being admitted for acute UTI-only sensitivities to IV antibiotics given allergy to Cipro.     PAST MEDICAL HISTORY:   Past Medical History:  Diagnosis Date  . Chest pain syndrome   . Chronic back pain   . Chronic diarrhea   . GERD (gastroesophageal reflux disease)   . H/O: GI bleed   . Hyperlipidemia   . Hypertension   . IDA (iron deficiency anemia)   . Insomnia   . Non-insulin dependent type 2 diabetes mellitus ([place])   . Pancreatitis     PAST SURGICAL HISTORY:  Past Surgical History:  Procedure Laterality Date  . APPENDECTOMY    . CHOLECYSTECTOMY    . EXPLORATORY LAPAROTOMY    . HERNIA REPAIR     x 2  . KNEE ARTHROSCOPY Left   . TOTAL ABDOMINAL HYSTERECTOMY W/ BILATERAL SALPINGOOPHORECTOMY     one ovary still left    SOCIAL HISTORY:  Social History   Tobacco Use  . Smoking status: Never Smoker  . Smokeless tobacco: Never Used  Substance Use Topics  . Alcohol use: No    Alcohol/week: 0.0 oz    FAMILY HISTORY:  Family History  Problem Relation Age of Onset  . Prostate cancer Neg Hx   . Bladder Cancer Neg Hx   . Kidney cancer Neg Hx     DRUG ALLERGIES:  Allergies  Allergen Reactions  .  Ciprofloxacin Hives  . Codeine Other (See Comments)    Other reaction(s): Headache Reaction:  Headaches   . Escitalopram Oxalate     Other reaction(s): Other (See Comments) Agitation  . Metronidazole Nausea Only  . Other     Other reaction(s): Unknown anti- Inflammatory and bee stings  . Pantoprazole Nausea Only  . Phenergan [Promethazine Hcl] Other (See Comments)    Reaction:  Unknown   . Sulfamethoxazole-Trimethoprim     Other reaction(s): Unknown    REVIEW OF SYSTEMS:   CONSTITUTIONAL: No fever, fatigue or weakness.  EYES: No blurred or double vision.  EARS, NOSE, AND THROAT: No tinnitus or ear pain.  RESPIRATORY: No cough, shortness of breath, wheezing or hemoptysis.  CARDIOVASCULAR: No chest pain, orthopnea, edema.  GASTROINTESTINAL: No nausea, vomiting, diarrhea or abdominal pain.  GENITOURINARY: + dysuria, nohematuria.  ENDOCRINE: No polyuria, nocturia,  HEMATOLOGY: No anemia, easy bruising or bleeding SKIN: No rash or lesion. MUSCULOSKELETAL: No joint pain or arthritis.   NEUROLOGIC: No tingling, numbness, weakness.  PSYCHIATRY: No anxiety or depression.   MEDICATIONS AT HOME:  Prior to Admission medications   Medication Sig Start Date End Date Taking? Authorizing Provider  acetaminophen (TYLENOL) 500 MG tablet Take 500 mg by mouth 2 (two) times daily.    Yes [provider]  atorvastatin (LIPITOR) 20 MG tablet Take  20 mg by mouth at bedtime.    Yes [provider]  conjugated estrogens (PREMARIN) vaginal cream Place 1 Applicatorful vaginally daily. Use pea sized amount M-W-Fr before bedtime 08/24/17  Yes Cindy Espy, MD  insulin NPH-regular Human (NOVOLIN 70/30) (70-30) 100 UNIT/ML injection Inject 3-25 Units into the skin 2 (two) times daily. Pt uses 12 units in the morning. and 3 units in the evening.   Yes [provider]  insulin regular (NOVOLIN R,HUMULIN R) 100 units/mL injection Inject 2-10 Units into the skin as needed for high  blood sugar. Pt uses as needed per sliding scale:    151-200:  2 units  201-250:  4 units 251-300:  6 units  301-350:  8 units 351-400:  10 units   Yes [provider]  memantine (NAMENDA) 5 MG tablet Take 5 mg by mouth 2 (two) times daily.    Yes [provider]  omeprazole (PRILOSEC) 40 MG capsule Take 40 mg by mouth daily.    Yes [provider]  oxybutynin (DITROPAN-XL) 5 MG 24 hr tablet Take 5 mg by mouth 2 (two) times daily.    Yes [provider]  traMADol (ULTRAM) 50 MG tablet Take 1 tablet (50 mg total) by mouth every 6 (six) hours as needed for moderate pain. 04/16/16  Yes Cindy Flock, MD  irbesartan (AVAPRO) 150 MG tablet Take 150 mg by mouth daily.    [provider]  nitrofurantoin, macrocrystal-monohydrate, (MACROBID) 100 MG capsule Take 1 capsule (100 mg total) every 12 (twelve) hours by mouth. Patient not taking: Reported on 09/17/2017 09/11/17   Cindy Mew, MD      PHYSICAL EXAMINATION:   VITAL SIGNS: Blood pressure (!) 125/59, pulse 77, temperature 98.5 F (36.9 C), temperature source Oral, resp. rate 18, height 4\' 9"  (1.448 m), weight 59 kg (130 lb), SpO2 99 %.  GENERAL:  83 y.o.-year-old patient lying in the bed with no acute distress.  EYES: Pupils equal, round, reactive to light and accommodation. No scleral icterus. Extraocular muscles intact.  HEENT: Head atraumatic, normocephalic. Oropharynx and nasopharynx clear.  Frail-appearing NECK:  Supple, no jugular venous distention. No thyroid enlargement, no tenderness.  LUNGS: Normal breath sounds bilaterally, no wheezing, rales,rhonchi or crepitation. No use of accessory muscles of respiration.  CARDIOVASCULAR: S1, S2 normal. No murmurs, rubs, or gallops.  ABDOMEN: Soft, nontender, nondistended. Bowel sounds present. No organomegaly or mass.  EXTREMITIES: No pedal edema, cyanosis, or clubbing.  NEUROLOGIC: Cranial nerves II through XII are intact. MAES. Gait not  checked.  PSYCHIATRIC: The patient is alert and oriented x 3.  SKIN: No obvious rash, lesion, or ulcer.  Pale  LABORATORY PANEL:   CBC Recent Labs  Lab 09/11/17 1823 09/17/17 1859  WBC 12.4* 12.7*  HGB 12.4 12.6  HCT 37.5 38.1  PLT 388 390  MCV 83.6 84.4  MCH 27.7 27.8  MCHC 33.2 33.0  RDW 17.0* 16.5*  LYMPHSABS 3.3  --   MONOABS 2.0*  --   EOSABS 0.3  --   BASOSABS 0.1  --    ------------------------------------------------------------------------------------------------------------------  Chemistries  Recent Labs  Lab 09/11/17 1823 09/17/17 1859  NA 132* 130*  K 3.5 4.4  CL 98* 97*  CO2 24 24  GLUCOSE 139* 113*  BUN 15 13  CREATININE 0.73 0.90  CALCIUM 8.6* 8.9  AST 21  --   ALT 7*  --   ALKPHOS 52  --   BILITOT 0.6  --    ------------------------------------------------------------------------------------------------------------------ estimated creatinine  clearance is 33.2 mL/min (by C-G formula based on SCr of 0.9 mg/dL). ------------------------------------------------------------------------------------------------------------------ No results for input(s): TSH, T4TOTAL, T3FREE, THYROIDAB in the last 72 hours.  Invalid input(s): FREET3   Coagulation profile No results for input(s): INR, PROTIME in the last 168 hours. ------------------------------------------------------------------------------------------------------------------- No results for input(s): DDIMER in the last 72 hours. -------------------------------------------------------------------------------------------------------------------  Cardiac Enzymes No results for input(s): CKMB, TROPONINI, MYOGLOBIN in the last 168 hours.  Invalid input(s): CK ------------------------------------------------------------------------------------------------------------------ Invalid input(s):  POCBNP  ---------------------------------------------------------------------------------------------------------------  Urinalysis    Component Value Date/Time   COLORURINE YELLOW (A) 09/17/2017 2203   APPEARANCEUR CLOUDY (A) 09/17/2017 2203   APPEARANCEUR Cloudy (A) 08/24/2017 1631   LABSPEC 1.010 09/17/2017 2203   LABSPEC 1.013 05/17/2014 1947   PHURINE 6.0 09/17/2017 2203   GLUCOSEU NEGATIVE 09/17/2017 2203   GLUCOSEU Negative 05/17/2014 1947   HGBUR SMALL (A) 09/17/2017 2203   BILIRUBINUR NEGATIVE 09/17/2017 2203   BILIRUBINUR Negative 08/24/2017 1631   BILIRUBINUR Negative 05/17/2014 1947   KETONESUR NEGATIVE 09/17/2017 2203   PROTEINUR 30 (A) 09/17/2017 2203   NITRITE NEGATIVE 09/17/2017 2203   LEUKOCYTESUR LARGE (A) 09/17/2017 2203   LEUKOCYTESUR 3+ (A) 08/24/2017 1631   LEUKOCYTESUR 3+ 05/17/2014 1947     RADIOLOGY: No results found.  EKG: Orders placed or performed during the hospital encounter of 04/30/17  . ED EKG  . ED EKG  . EKG 12-Lead  . EKG 12-Lead    IMPRESSION AND PLAN: 1 acute pseudomonal UTI Admit to regular nursing floor bed, cefepime for 3-5-day course  2 acute hyponatremia/hypochloremia Replete with IV fluids and check BMP in the morning  3 chronic diabetes mellitus type 2 Continue home regiment, sliding scale insulin with Accu-Cheks per routine  4 chronic GERD without esophagitis PPI daily  DNR status Condition stable Prognosis fair DVT prophylaxis with heparin subcu Disposition home in 3-5 days in care of family  All the records are reviewed and case discussed with ED provider. Management plans discussed with the patient, family and they are in agreement.  CODE STATUS: Code Status History    Date Active Date Inactive Code Status Order ID Comments User Context   06/27/2017 20:10 06/29/2017 21:18 Full Code [id number]  Epifanio Lesches, MD ED   05/01/2017 01:20 05/02/2017 14:46 Full Code [id number]  Harvie Bridge, DO Inpatient    04/13/2016 21:15 04/17/2016 18:30 Full Code [id number]  Henreitta Leber, MD Inpatient       TOTAL TIME TAKING CARE OF THIS PATIENT: 45 minutes.    Cindy Sullivan M.D on 09/17/2017   Between 7am to 6pm - Pager - [phone]  After 6pm go to [email or web address] - password [email or web address]  Office  [phone]  CC: Primary care physician; Cindy Axe, MD   Note: This dictation was prepared with Dragon dictation along with smaller phrase technology. Any transcriptional errors that result from this process are unintentional.

## 2017-09-17 NOTE — ED Notes (Signed)
Pt's daughter and pt requesting blood sugar check.

## 2017-09-17 NOTE — ED Notes (Signed)
Pt complains of itching with antimicrobial disc, disc removed, sterile dressing applied over disc with itching improvement.

## 2017-09-18 LAB — GLUCOSE, CAPILLARY
GLUCOSE-CAPILLARY: 80 mg/dL (ref 65–99)
GLUCOSE-CAPILLARY: 63 mg/dL — AB (ref 65–99)
GLUCOSE-CAPILLARY: 118 mg/dL — AB (ref 65–99)
GLUCOSE-CAPILLARY: 134 mg/dL — AB (ref 65–99)
Glucose-Capillary: 128 mg/dL — ABNORMAL HIGH (ref 65–99)

## 2017-09-18 LAB — BASIC METABOLIC PANEL
Anion gap: 7 (ref 5–15)
BUN: 15 mg/dL (ref 6–20)
CO2: 26 mmol/L (ref 22–32)
CREATININE: 0.98 mg/dL (ref 0.44–1.00)
Calcium: 8.2 mg/dL — ABNORMAL LOW (ref 8.9–10.3)
Chloride: 100 mmol/L — ABNORMAL LOW (ref 101–111)
GFR calc Af Amer: 59 mL/min — ABNORMAL LOW (ref >60)
GFR, EST NON AFRICAN AMERICAN: 51 mL/min — AB (ref >60)
GLUCOSE: 179 mg/dL — AB (ref 65–99)
Potassium: 4.1 mmol/L (ref 3.5–5.1)
SODIUM: 133 mmol/L — AB (ref 135–145)

## 2017-09-18 MED ORDER — ONDANSETRON HCL 4 MG PO TABS: 4.0000 mg | ORAL_TABLET | Freq: Four times a day (QID) | ORAL | Status: DC | PRN

## 2017-09-18 MED ORDER — MEMANTINE HCL 5 MG PO TABS
5.0000 mg | ORAL_TABLET | Freq: Two times a day (BID) | ORAL | Status: DC
  Administered 2017-09-18 – 2017-09-20 (×6): 5 mg via ORAL
  Filled 2017-09-18 (×8): qty 1

## 2017-09-18 MED ORDER — HEPARIN SODIUM (PORCINE) 5000 UNIT/ML IJ SOLN
5000.0000 [IU] | Freq: Three times a day (TID) | INTRAMUSCULAR | Status: DC
  Administered 2017-09-18 – 2017-09-20 (×8): 5000 [IU] via SUBCUTANEOUS
  Filled 2017-09-18 (×8): qty 1

## 2017-09-18 MED ORDER — OXYBUTYNIN CHLORIDE ER 5 MG PO TB24: 5.0000 mg | ORAL_TABLET | Freq: Two times a day (BID) | ORAL | Status: DC

## 2017-09-18 MED ORDER — ONDANSETRON HCL 4 MG/2ML IJ SOLN: 4.0000 mg | Freq: Four times a day (QID) | INTRAMUSCULAR | Status: DC | PRN

## 2017-09-18 MED ORDER — SODIUM CHLORIDE 0.9 % IV SOLN
INTRAVENOUS | Status: DC
  Administered 2017-09-18 – 2017-09-20 (×3): 50 mL/h via INTRAVENOUS

## 2017-09-18 MED ORDER — INSULIN ASPART PROT & ASPART (70-30 MIX) 100 UNIT/ML ~~LOC~~ SUSP
12.0000 [IU] | Freq: Every day | SUBCUTANEOUS | Status: DC
  Administered 2017-09-18 – 2017-09-20 (×3): 12 [IU] via SUBCUTANEOUS
  Filled 2017-09-18 (×2): qty 10
  Filled 2017-09-18: qty 1

## 2017-09-18 MED ORDER — ACETAMINOPHEN 650 MG RE SUPP: 650.0000 mg | Freq: Four times a day (QID) | RECTAL | Status: DC | PRN

## 2017-09-18 MED ORDER — ATORVASTATIN CALCIUM 20 MG PO TABS
20.0000 mg | ORAL_TABLET | Freq: Every day | ORAL | Status: DC
  Administered 2017-09-18 – 2017-09-19 (×3): 20 mg via ORAL
  Filled 2017-09-18 (×3): qty 1

## 2017-09-18 MED ORDER — IRBESARTAN 150 MG PO TABS
150.0000 mg | ORAL_TABLET | Freq: Every day | ORAL | Status: DC
  Administered 2017-09-18 – 2017-09-20 (×2): 150 mg via ORAL
  Filled 2017-09-18 (×4): qty 1

## 2017-09-18 MED ORDER — POLYETHYLENE GLYCOL 3350 17 G PO PACK: 17.0000 g | PACK | Freq: Every day | ORAL | Status: DC | PRN

## 2017-09-18 MED ORDER — MORPHINE SULFATE (PF) 2 MG/ML IV SOLN
1.0000 mg | INTRAVENOUS | Status: DC | PRN
  Administered 2017-09-19 (×2): 1 mg via INTRAVENOUS
  Filled 2017-09-18 (×3): qty 1

## 2017-09-18 MED ORDER — SODIUM CHLORIDE 0.9 % IV SOLN: 1.0000 g | Freq: Two times a day (BID) | INTRAVENOUS | 0 refills | Status: DC

## 2017-09-18 MED ORDER — ENOXAPARIN SODIUM 40 MG/0.4ML ~~LOC~~ SOLN: 40.0000 mg | SUBCUTANEOUS | Status: DC

## 2017-09-18 MED ORDER — OXYBUTYNIN CHLORIDE ER 5 MG PO TB24
5.0000 mg | ORAL_TABLET | Freq: Two times a day (BID) | ORAL | Status: DC
  Administered 2017-09-18 – 2017-09-20 (×6): 5 mg via ORAL
  Filled 2017-09-18 (×7): qty 1

## 2017-09-18 MED ORDER — SODIUM CHLORIDE 0.9% FLUSH
3.0000 mL | Freq: Two times a day (BID) | INTRAVENOUS | Status: DC
  Administered 2017-09-18 – 2017-09-20 (×4): 3 mL via INTRAVENOUS

## 2017-09-18 MED ORDER — CYCLOBENZAPRINE HCL 10 MG PO TABS
5.0000 mg | ORAL_TABLET | Freq: Two times a day (BID) | ORAL | Status: DC
  Administered 2017-09-18 – 2017-09-20 (×4): 5 mg via ORAL
  Filled 2017-09-18 (×5): qty 1

## 2017-09-18 MED ORDER — ACETAMINOPHEN 325 MG PO TABS: 650.0000 mg | ORAL_TABLET | Freq: Four times a day (QID) | ORAL | Status: DC | PRN

## 2017-09-18 MED ORDER — PANTOPRAZOLE SODIUM 40 MG PO TBEC
40.0000 mg | DELAYED_RELEASE_TABLET | Freq: Every day | ORAL | Status: DC
  Administered 2017-09-18 – 2017-09-20 (×3): 40 mg via ORAL
  Filled 2017-09-18 (×4): qty 1

## 2017-09-18 NOTE — Progress Notes (Signed)
Cindy Sullivan at [place] NAME: Cindy Sullivan    MR#:  [id number]  DATE OF BIRTH:  05/18/1929  SUBJECTIVE:  No new complaints  REVIEW OF SYSTEMS:   Review of Systems  Constitutional: Negative for chills, fever and weight loss.  HENT: Negative for ear discharge, ear pain and nosebleeds.   Eyes: Negative for blurred vision, pain and discharge.  Respiratory: Negative for sputum production, shortness of breath, wheezing and stridor.   Cardiovascular: Negative for chest pain, palpitations, orthopnea and PND.  Gastrointestinal: Negative for abdominal pain, diarrhea, nausea and vomiting.  Genitourinary: Negative for frequency and urgency.  Musculoskeletal: Negative for back pain and joint pain.  Neurological: Positive for weakness. Negative for sensory change, speech change and focal weakness.  Psychiatric/Behavioral: Negative for depression and hallucinations. The patient is not nervous/anxious.    Tolerating Diet:yes Tolerating PT:   DRUG ALLERGIES:   Allergies  Allergen Reactions  . Ciprofloxacin Hives  . Codeine Other (See Comments)    Other reaction(s): Headache Reaction:  Headaches   . Escitalopram Oxalate     Other reaction(s): Other (See Comments) Agitation  . Metronidazole Nausea Only  . Other     Other reaction(s): Unknown anti- Inflammatory and bee stings  . Pantoprazole Nausea Only  . Phenergan [Promethazine Hcl] Other (See Comments)    Reaction:  Unknown   . Sulfamethoxazole-Trimethoprim     Other reaction(s): Unknown    VITALS:  Blood pressure (!) 111/45, pulse 84, temperature 98.9 F (37.2 C), temperature source Oral, resp. rate 19, height 4\' 9"  (1.448 m), weight 54.6 kg (120 lb 6.4 oz), SpO2 97 %.  PHYSICAL EXAMINATION:   Physical Exam  GENERAL:  83 y.o.-year-old patient lying in the bed with no acute distress.  EYES: Pupils equal, round, reactive to light and accommodation. No scleral icterus. Extraocular  muscles intact.  HEENT: Head atraumatic, normocephalic. Oropharynx and nasopharynx clear. Poor dentition NECK:  Supple, no jugular venous distention. No thyroid enlargement, no tenderness.  LUNGS: Normal breath sounds bilaterally, no wheezing, rales, rhonchi. No use of accessory muscles of respiration.  CARDIOVASCULAR: S1, S2 normal. No murmurs, rubs, or gallops.  ABDOMEN: Soft, nontender, nondistended. Bowel sounds present. No organomegaly or mass.  EXTREMITIES: No cyanosis, clubbing or edema b/l.    NEUROLOGIC: Cranial nerves II through XII are intact. No focal Motor or sensory deficits b/l.   PSYCHIATRIC:  patient is alert and oriented x 3.  SKIN: No obvious rash, lesion, or ulcer.   LABORATORY PANEL:  CBC Recent Labs  Lab 09/17/17 1859  WBC 12.7*  HGB 12.6  HCT 38.1  PLT 390    Chemistries  Recent Labs  Lab 09/11/17 1823  09/18/17 0534  NA 132*   < > 133*  K 3.5   < > 4.1  CL 98*   < > 100*  CO2 24   < > 26  GLUCOSE 139*   < > 179*  BUN 15   < > 15  CREATININE 0.73   < > 0.98  CALCIUM 8.6*   < > 8.2*  AST 21  --   --   ALT 7*  --   --   ALKPHOS 52  --   --   BILITOT 0.6  --   --    < > = values in this interval not displayed.   Cardiac Enzymes No results for input(s): TROPONINI in the last 168 hours. RADIOLOGY:  No results found. ASSESSMENT AND PLAN:  Cindy Sullivan  is a 83 y.o. female with a known history per below presented to the emergency room for IV antibiotics, patient was called back for Pseudomonas UTI only sensitive to IV antibiotics-patient with allergy to Cipro, patient with one-week history of worsening urinary frequency, pain with urination, lower abdominal pain/discomfort  1. pseudomonal UTI IV meropenem 1 g bid -CM to help with IV abxs for home -pt has port  2 acute hyponatremia/hypochloremia Replete with IV fluids and labs are improving  3 chronic diabetes mellitus type 2 Continue home regiment, sliding scale insulin with Accu-Cheks per  routine  4 chronic GERD without esophagitis PPI daily  DNR status Condition stable Prognosis fair DVT prophylaxis with heparin subcu  Left message for dter Tye Maryland Hyun  Case discussed with Care Management/Social Worker. Management plans discussed with the patient, family and they are in agreement.  CODE STATUS: DNR DVT Prophylaxis: heparin  TOTAL TIME TAKING CARE OF THIS PATIENT: *30* minutes.  >50% time spent on counselling and coordination of care  POSSIBLE D/C IN *1DAYS, DEPENDING ON CLINICAL CONDITION.  Note: This dictation was prepared with Dragon dictation along with smaller phrase technology. Any transcriptional errors that result from this process are unintentional.  Fritzi Mandes M.D on 09/18/2017 at 10:18 AM  Between 7am to 6pm - Pager - [phone]  After 6pm go to [email or web address] - password [email or web address] [facility]  Office  [phone]  CC: Primary care physician; [place]on Axe, MD

## 2017-09-18 NOTE — Progress Notes (Signed)
Pt c/o back spasm. Notified Dr Posey Pronto. Received order for Flexeril

## 2017-09-18 NOTE — ED Notes (Signed)
Pt transport to 159 

## 2017-09-18 NOTE — Progress Notes (Signed)
New Admission Note:   Arrival Method: per stretcher from ED, pt came from home Mental Orientation: alert and oriented to person, place and situation, can be intermittently forgetful Telemetry: none ordered Assessment: Completed Skin: warm, dry, with redness noted on the buttocks, blanchable, no preexisting wound/sore noted. Prophylactic sacral foam dressing applied. IV: with accessed right chest port, ongoing Meropenem from ED infusing. Pain: 8/10 scale chronic lower back pain. Will administer PRN pain medicine Safety Measures: Safety Fall Prevention Plan has been given and discussed Admission: Completed 1A Orientation: Patient has been oriented to the room, unit and staff.  Family: daughter Tye Maryland at bedside  Orders have been reviewed and implemented. Fall risk, allergy and DNR armbands applied, yellow socks refused by pt, call light has been placed within reach and bed alarm activated. Will continue to monitor patient.  Georgeanna Harrison, RN

## 2017-09-18 NOTE — ED Notes (Signed)
Pt cleansed of urine. Pt yelling out anytime a staff member touches her "ouch, you like to kill me, murder". Pt is yelling at daughter states daughter "is spilling things on me". Daughter states pt has frequent crying out and irritation when "she gets sick".

## 2017-09-18 NOTE — Progress Notes (Signed)
Patient admitted for UTI and ordered lovenox 40 mg subq daily for VTE prophylaxis. CrCl 31.9 ml/min  Will switch therapy to heparin 5000 subq q8h since patient is elderly and does not have adequate renal function.  Tobie Lords, PharmD, BCPS Clinical Pharmacist 09/18/2017

## 2017-09-18 NOTE — Care Management Note (Signed)
Case Management Note  Patient Details  Name: Cindy Sullivan MRN: [id number] Date of Birth: 21-Sep-1929  Subjective/Objective:    Discussed discharge planning on IV meropenem BID with Dr Fritzi Mandes and with Melene Muller at [facility] today. Copy of the IV Meropenem order was faxed to the [facility] with request for a report about patient's cost for this medication. Jermaine advised that this order for meropenem could not be run thru for insurance costs until Monday. Anticipated date of discharge on Monday 09/19/17 with IV Meropenem.                Action/Plan:   Expected Discharge Date:                  Expected Discharge Plan:     In-House Referral:     Discharge planning Services     Post Acute Care Choice:    Choice offered to:     DME Arranged:    DME Agency:     HH Arranged:    HH Agency:     Status of Service:     If discussed at [employer] of Stay Meetings, dates discussed:    Additional Comments:  Gerard Cantara A, RN 09/18/2017, 10:36 AM

## 2017-09-18 NOTE — Progress Notes (Signed)
Pt yelling and screaming in pain, PRN pain medicine not yet due at this time. Dr. Estanislado Pandy paged and ordered to give Morphine 1mg  IV every 4hrs PRN. Will administer as ordered, attend pt's needs and continue to monitor.

## 2017-09-18 NOTE — ED Notes (Signed)
Po fluids provided, pt readjusted in bed for comfort. Pt continues to complain when touched anywhere on body. Pt yelling at daughter intermittently regarding daughter's behavior.

## 2017-09-19 LAB — GLUCOSE, CAPILLARY
GLUCOSE-CAPILLARY: 105 mg/dL — AB (ref 65–99)
GLUCOSE-CAPILLARY: 140 mg/dL — AB (ref 65–99)
Glucose-Capillary: 93 mg/dL (ref 65–99)
Glucose-Capillary: 97 mg/dL (ref 65–99)

## 2017-09-19 MED ORDER — LORATADINE 5 MG/5ML PO SYRP
5.0000 mg | ORAL_SOLUTION | Freq: Every day | ORAL | Status: DC | PRN
  Administered 2017-09-19: 5 mg via ORAL
  Filled 2017-09-19 (×2): qty 5

## 2017-09-19 MED ORDER — SODIUM CHLORIDE 0.9% FLUSH: 10.0000 mL | INTRAVENOUS | Status: DC | PRN

## 2017-09-19 MED ORDER — SALINE SPRAY 0.65 % NA SOLN
1.0000 | NASAL | Status: DC | PRN
  Administered 2017-09-19: 1 via NASAL
  Filled 2017-09-19: qty 44

## 2017-09-19 NOTE — NC FL2 (Signed)
[place] LEVEL OF CARE SCREENING TOOL     IDENTIFICATION  Patient Name: Cindy Sullivan Birthdate: 16-May-1929 Sex: female Admission Date (Current Location): 09/17/2017  [place] and [place] Number:  Engineering geologist and Address:  [facility], [address], [place], [place] [address]      Provider Number: [id number]  Attending Physician Name and Address:  Fritzi Mandes, MD  Relative Name and Phone Number:       Current Level of Care: Hospital Recommended Level of Care: [place] Prior Approval Number:    Date Approved/Denied:   PASRR Number: [id number] )  Discharge Plan: SNF    Current Diagnoses: Patient Active Problem List   Diagnosis Date Noted  . UTI (urinary tract infection) 09/17/2017  . Sepsis ([place]) 06/27/2017  . SIRS (systemic inflammatory response syndrome) ([place]) 04/30/2017  . Type 2 diabetes mellitus ([place]) 01/18/2016  . Alzheimer's dementia, late onset 01/18/2016  . Iron deficiency anemia due to chronic blood loss 12/30/2015  . Chronic hyponatremia 04/07/2015  . Chronic kidney disease (CKD), stage III (moderate) ([place]) 04/07/2015  . Essential (primary) hypertension 04/07/2015  . Chronic diarrhea 02/03/2015  . Insomnia, persistent 02/02/2015  . Primary osteoarthritis of both knees 02/02/2015  . Pure hypercholesterolemia 09/12/2014    Orientation RESPIRATION BLADDER Height & Weight     Self, Time, Situation, Place  Normal Continent Weight: 120 lb 6.4 oz (54.6 kg) Height:  4\' 9"  (144.8 cm)  BEHAVIORAL SYMPTOMS/MOOD NEUROLOGICAL BOWEL NUTRITION STATUS      Continent Diet(Diet: Heart Healthy )  AMBULATORY STATUS COMMUNICATION OF NEEDS Skin   Extensive Assist Verbally Other (Comment)(PICC line for IV meropenem 1 g bid)                       Personal Care Assistance Level of Assistance  Bathing, Feeding, Dressing Bathing Assistance: Limited assistance Feeding assistance: Independent Dressing  Assistance: Limited assistance     Functional Limitations Info  Sight, Hearing, Speech Sight Info: Adequate Hearing Info: Impaired Speech Info: Adequate    SPECIAL CARE FACTORS FREQUENCY  PT (By licensed PT)     PT Frequency: (5)              Contractures      Additional Factors Info  Code Status, Allergies, Isolation Precautions Code Status Info: (DNR ) Allergies Info: (Ciprofloxacin, Codeine, Escitalopram Oxalate, Metronidazole, Other,Pantoprazole, Phenergan Promethazine Hcl, Sulfamethoxazole-trimethoprim)     Isolation Precautions Info: (ESBL )     Current Medications (09/19/2017):  This is the current hospital active medication list Current Facility-Administered Medications  Medication Dose Route Frequency Provider Last Rate Last Dose  . 0.9 %  sodium chloride infusion   Intravenous Continuous Salary, Montell D, MD 50 mL/hr at 09/19/17 0141 50 mL/hr at 09/19/17 0141  . acetaminophen (TYLENOL) tablet 650 mg  650 mg Oral Q6H PRN Salary, Montell D, MD       Or  . acetaminophen (TYLENOL) suppository 650 mg  650 mg Rectal Q6H PRN Salary, Montell D, MD      . atorvastatin (LIPITOR) tablet 20 mg  20 mg Oral QHS Salary, Montell D, MD   20 mg at 09/18/17 2029  . cyclobenzaprine (FLEXERIL) tablet 5 mg  5 mg Oral BID Fritzi Mandes, MD   5 mg at 09/19/17 0818  . heparin injection 5,000 Units  5,000 Units Subcutaneous Q8H Salary, Holly Bodily D, MD   5,000 Units at 09/19/17 0819  . HYDROcodone-acetaminophen (NORCO/VICODIN) 5-325 MG  per tablet 1 tablet  1 tablet Oral TID PRN Gorden Harms, MD   1 tablet at 09/19/17 1437  . insulin aspart (novoLOG) injection 0-5 Units  0-5 Units Subcutaneous QHS Salary, Montell D, MD      . insulin aspart (novoLOG) injection 0-9 Units  0-9 Units Subcutaneous TID WC Gorden Harms, MD   1 Units at 09/18/17 [phone]  . insulin aspart protamine- aspart (NOVOLOG MIX 70/30) injection 12 Units  12 Units Subcutaneous Q breakfast Salary, Avel Peace, MD   12 Units  at 09/19/17 0820  . irbesartan (AVAPRO) tablet 150 mg  150 mg Oral Daily Salary, Montell D, MD   150 mg at 09/18/17 0945  . loratadine (CLARITIN) 5 MG/5ML syrup 5 mg  5 mg Oral Daily PRN Fritzi Mandes, MD   5 mg at 09/19/17 1219  . memantine (NAMENDA) tablet 5 mg  5 mg Oral BID Loney Hering D, MD   5 mg at 09/19/17 0817  . meropenem (MERREM) 1 g in sodium chloride 0.9 % 100 mL IVPB  1 g Intravenous Q12H Darel Hong, MD   Stopped at 09/19/17 [phone]  . morphine 2 MG/ML injection 1 mg  1 mg Intravenous Q4H PRN Saundra Shelling, MD   1 mg at 09/19/17 1119  . ondansetron (ZOFRAN) tablet 4 mg  4 mg Oral Q6H PRN Salary, Montell D, MD       Or  . ondansetron (ZOFRAN) injection 4 mg  4 mg Intravenous Q6H PRN Salary, Montell D, MD      . oxybutynin (DITROPAN-XL) 24 hr tablet 5 mg  5 mg Oral BID Salary, Montell D, MD   5 mg at 09/19/17 0817  . pantoprazole (PROTONIX) EC tablet 40 mg  40 mg Oral Daily Salary, Montell D, MD   40 mg at 09/19/17 0816  . polyethylene glycol (MIRALAX / GLYCOLAX) packet 17 g  17 g Oral Daily PRN Salary, Montell D, MD      . sodium chloride (OCEAN) 0.65 % nasal spray 1 spray  1 spray Each Nare PRN Fritzi Mandes, MD   1 spray at 09/19/17 1220  . sodium chloride flush (NS) 0.9 % injection 3 mL  3 mL Intravenous Q12H Salary, Montell D, MD   3 mL at 09/18/17 2044   Facility-Administered Medications Ordered in Other Encounters  Medication Dose Route Frequency Provider Last Rate Last Dose  . sodium chloride 0.9 % injection 10 mL  10 mL Intravenous PRN Leia Alf, MD   10 mL at 03/03/15 1528     Discharge Medications: Please see discharge summary for a list of discharge medications.  Relevant Imaging Results:  Relevant Lab Results:   Additional Information (SSN: [id number])  Bhavya Eschete, Veronia Beets, LCSW

## 2017-09-19 NOTE — Progress Notes (Signed)
[facility] [place] NAME: Rayvon Brandvold    MR#:  [id number]  DATE OF BIRTH:  1929-07-01  SUBJECTIVE:  Patient doing well.  Daughter in the room.  No new issues per RN.  REVIEW OF SYSTEMS:   Review of Systems  Constitutional: Negative for chills, fever and weight loss.  HENT: Negative for ear discharge, ear pain and nosebleeds.   Eyes: Negative for blurred vision, pain and discharge.  Respiratory: Negative for sputum production, shortness of breath, wheezing and stridor.   Cardiovascular: Negative for chest pain, palpitations, orthopnea and PND.  Gastrointestinal: Negative for abdominal pain, diarrhea, nausea and vomiting.  Genitourinary: Negative for frequency and urgency.  Musculoskeletal: Negative for back pain and joint pain.  Neurological: Positive for weakness. Negative for sensory change, speech change and focal weakness.  Psychiatric/Behavioral: Negative for depression and hallucinations. The patient is not nervous/anxious.    Tolerating Diet: Yes Tolerating PT: Pending  DRUG ALLERGIES:   Allergies  Allergen Reactions  . Ciprofloxacin Hives  . Codeine Other (See Comments)    Other reaction(s): Headache Reaction:  Headaches   . Escitalopram Oxalate     Other reaction(s): Other (See Comments) Agitation  . Metronidazole Nausea Only  . Other     Other reaction(s): Unknown anti- Inflammatory and bee stings  . Pantoprazole Nausea Only  . Phenergan [Promethazine Hcl] Other (See Comments)    Reaction:  Unknown   . Sulfamethoxazole-Trimethoprim     Other reaction(s): Unknown    VITALS:  Blood pressure 139/61, pulse 94, temperature 98.4 F (36.9 C), temperature source Oral, resp. rate 20, height 4\' 9"  (1.448 m), weight 54.6 kg (120 lb 6.4 oz), SpO2 97 %.  PHYSICAL EXAMINATION:   Physical Exam  GENERAL:  83 y.o.-year-old patient lying in the bed with no acute distress.  EYES: Pupils equal, round, reactive to light  and accommodation. No scleral icterus. Extraocular muscles intact.  HEENT: Head atraumatic, normocephalic. Oropharynx and nasopharynx clear.  NECK:  Supple, no jugular venous distention. No thyroid enlargement, no tenderness.  LUNGS: Normal breath sounds bilaterally, no wheezing, rales, rhonchi. No use of accessory muscles of respiration. Right upper chest port CARDIOVASCULAR: S1, S2 normal. No murmurs, rubs, or gallops.  ABDOMEN: Soft, nontender, nondistended. Bowel sounds present. No organomegaly or mass.  EXTREMITIES: No cyanosis, clubbing or edema b/l.    NEUROLOGIC: Cranial nerves II through XII are intact. No focal Motor or sensory deficits b/l.   PSYCHIATRIC:  patient is alert and oriented x 3.  SKIN: No obvious rash, lesion, or ulcer.   LABORATORY PANEL:  CBC Recent Labs  Lab 09/17/17 1859  WBC 12.7*  HGB 12.6  HCT 38.1  PLT 390    Chemistries  Recent Labs  Lab 09/18/17 0534  NA 133*  K 4.1  CL 100*  CO2 26  GLUCOSE 179*  BUN 15  CREATININE 0.98  CALCIUM 8.2*   Cardiac Enzymes No results for input(s): TROPONINI in the last 168 hours. RADIOLOGY:  No results found. ASSESSMENT AND PLAN:  ayodele hartsock y.o.femalewith a known history per below presented to the emergency room for IV antibiotics, patient was called back for Pseudomonas UTI only sensitive to IV antibiotics-patient with allergy to Cipro,patient with one-week history of worsening urinary frequency, pain with urination, lower abdominal pain/discomfort  1. pseudomonal UTI IV meropenem 1 g bid -CM to help with IV abxs for home -pt has port  2acute hyponatremia/hypochloremia Replete with IV fluids and labs are improving  3chronic diabetes mellitus type 2 Continue home regiment, sliding scale insulin with Accu-Cheks per routine  4chronic GERD without esophagitis PPI daily  DNR status Condition stable Prognosis fair DVT prophylaxis with heparin subcu  PT to see today. CSW for d/c  planning  D/w dter Tye Maryland Dyckman in the room  Case discussed with Care Management/Social Worker. Management plans discussed with the patient, family and they are in agreement.  CODE STATUS: DNR  DVT Prophylaxis: lovenox  TOTAL TIME TAKING CARE OF THIS PATIENT: *25* minutes.  >50% time spent on counselling and coordination of care  POSSIBLE D/C IN  1-2 DAYS, DEPENDING ON CLINICAL CONDITION.  Note: This dictation was prepared with Dragon dictation along with smaller phrase technology. Any transcriptional errors that result from this process are unintentional.  Fritzi Mandes M.D on 09/19/2017 at 12:10 PM  Between 7am to 6pm - Pager - [phone]  After 6pm go to [email or web address] - password [email or web address] [facility]  Office  [phone]  CC: Primary care physician; Glendon Axe, MD

## 2017-09-19 NOTE — Evaluation (Signed)
Physical Therapy Evaluation Patient Details Name: Cindy Sullivan MRN: [id number] DOB: 11-02-1928 Today's Date: 09/19/2017   History of Present Illness  Ptis an88 y.o.femalewho presented to the emergency room for IV antibiotics, patient was called back for Pseudomonas UTI only sensitive to IV antibiotics-patient with allergy to Cipro,patient with one-week history of worsening urinary frequency, pain with urination, lower abdominal pain/discomfort, patient nontoxic-appearing on examination, daughter at the bedside, patient is now being admitted for acute UTI-only sensitivities to IV antibiotics given allergy to Cipro. Assessment includes: acute pseudomonal UTI, DM II, acute hyponatremia and hypochloremia, and GERD.    Clinical Impression  Pt presents with min deficits in strength, transfers, mobility, gait, and balance and mod deficits in activity tolerance.  Pt able to perform all bed mobility tasks with Mod I with use of rails and extra time and effort.  Pt CGA with transfers with cues for sequencing and was steady upon initial stand.  Pt able to amb 1 x 5' and 1 x 25' with RW and CGA with min cues to amb closer to RW.  Pt ambulated with slow cadence and short B step length but was steady without LOB.  Pt appears close to baseline level functionally but would benefit from a likely short duration of HHPT to address the above deficits for improved pt safety with mobility and decreased caregiver assistance.        Follow Up Recommendations Home health PT    Equipment Recommendations  None recommended by PT    Recommendations for Other Services       Precautions / Restrictions Precautions Precautions: Fall Restrictions Weight Bearing Restrictions: No      Mobility  Bed Mobility Overal bed mobility: Needs Assistance Bed Mobility: Supine to Sit;Sit to Supine     Supine to sit: Modified independent (Device/Increase time) Sit to supine: Modified independent (Device/Increase time)    General bed mobility comments: Pt uses bed rail with minimal extra time and effort required but no physical assistance  Transfers Overall transfer level: Needs assistance Equipment used: Rolling walker (2 wheeled) Transfers: Sit to/from Stand Sit to Stand: Min guard         General transfer comment: Good effort with transfers without physical assistance required and without LOB  Ambulation/Gait Ambulation/Gait assistance: Min guard Ambulation Distance (Feet): 25 Feet Assistive device: Rolling walker (2 wheeled) Gait Pattern/deviations: Step-through pattern;Decreased step length - right;Decreased step length - left;Trunk flexed   Gait velocity interpretation: Below normal speed for age/gender General Gait Details: Pt steady with amb and during turns with RW  Stairs            Wheelchair Mobility    Modified Rankin (Stroke Patients Only)       Balance Overall balance assessment: Needs assistance Sitting-balance support: Feet unsupported;Feet supported;No upper extremity supported Sitting balance-Leahy Scale: Good     Standing balance support: During functional activity;Bilateral upper extremity supported Standing balance-Leahy Scale: Good                               Pertinent Vitals/Pain Pain Assessment: No/denies pain    Home Living Family/patient expects to be discharged to:: Private residence Living Arrangements: Children Available Help at Discharge: Family;Available 24 hours/day Type of Home: House Home Access: Stairs to enter Entrance Stairs-Rails: Right;Left;Can reach both Entrance Stairs-Number of Steps: 2 Home Layout: One level Home Equipment: Walker - 2 wheels;Shower seat;Cane - single point      Prior Function  Level of Independence: Needs assistance   Gait / Transfers Assistance Needed: Amb household distances with RW and SBA from family, uses a cane to walk with family into the BR secondary to walker does not fit through the  door, no fall history  ADL's / Homemaking Assistance Needed: Family assists with all ADLs        Hand Dominance   Dominant Hand: Right    Extremity/Trunk Assessment   Upper Extremity Assessment Upper Extremity Assessment: Overall WFL for tasks assessed    Lower Extremity Assessment Lower Extremity Assessment: Generalized weakness       Communication   Communication: HOH  Cognition Arousal/Alertness: Awake/alert Behavior During Therapy: WFL for tasks assessed/performed Overall Cognitive Status: History of cognitive impairments - at baseline                                        General Comments      Exercises Total Joint Exercises Ankle Circles/Pumps: AROM;Both;10 reps Quad Sets: Strengthening;Both;10 reps Short Arc Quad: AROM;Both;10 reps Heel Slides: AROM;Both;5 reps Hip ABduction/ADduction: AROM;Both;5 reps Straight Leg Raises: AROM;Both;5 reps Long Arc Quad: AROM;Both;10 reps Knee Flexion: AROM;Both;10 reps   Assessment/Plan    PT Assessment Patient needs continued PT services  PT Problem List Decreased strength;Decreased activity tolerance;Decreased balance;Decreased knowledge of use of DME;Decreased mobility       PT Treatment Interventions DME instruction;Gait training;Stair training;Functional mobility training;Neuromuscular re-education;Balance training;Therapeutic exercise;Therapeutic activities    PT Goals (Current goals can be found in the Care Plan section)  Acute Rehab PT Goals Patient Stated Goal: To be stronger PT Goal Formulation: With patient Time For Goal Achievement: 10/02/17 Potential to Achieve Goals: Good    Frequency Min 2X/week   Barriers to discharge        Co-evaluation               AM-PAC PT "6 Clicks" Daily Activity  Outcome Measure Difficulty turning over in bed (including adjusting bedclothes, sheets and blankets)?: A Little Difficulty moving from lying on back to sitting on the side of the  bed? : A Little Difficulty sitting down on and standing up from a chair with arms (e.g., wheelchair, bedside commode, etc,.)?: Unable Help needed moving to and from a bed to chair (including a wheelchair)?: A Little Help needed walking in hospital room?: A Little Help needed climbing 3-5 steps with a railing? : A Little 6 Click Score: 16    End of Session Equipment Utilized During Treatment: Gait belt Activity Tolerance: Patient tolerated treatment well Patient left: in bed;with call bell/phone within reach;with bed alarm set Nurse Communication: Mobility status PT Visit Diagnosis: Muscle weakness (generalized) (M62.81);Difficulty in walking, not elsewhere classified (R26.2)    Time: 2542-7062 PT Time Calculation (min) (ACUTE ONLY): 32 min   Charges:   PT Evaluation $PT Eval Low Complexity: 1 Low PT Treatments $Therapeutic Exercise: 8-22 mins   PT G Codes:   PT G-Codes **NOT FOR INPATIENT CLASS** Functional Assessment Tool Used: AM-PAC 6 Clicks Basic Mobility Functional Limitation: Mobility: Walking and moving around Mobility: Walking and Moving Around Current Status (B7628): At least 40 percent but less than 60 percent impaired, limited or restricted Mobility: Walking and Moving Around Goal Status [phone]): At least 1 percent but less than 20 percent impaired, limited or restricted    D. Scott Alpha Chouinard PT, DPT 09/19/17, 1:13 PM

## 2017-09-19 NOTE — Care Management (Signed)
Met with daughter at bedside. She wants her mother to go to [place] and then transition to home with hospice. CSW updated. It is anticipated that patient will be ready for discharge today

## 2017-09-19 NOTE — Progress Notes (Addendum)
Clinical Education officer, museum (CSW) presented bed offers to patient's daughter Tye Maryland and patient's sister Diane. They chose [facility]. [facility] admissions coordinator at [facility] is aware of accepted bed offer. CSW will continue to follow and assist as needed.   [employer], LCSW [phone]

## 2017-09-19 NOTE — Clinical Social Work Placement (Signed)
   CLINICAL SOCIAL WORK PLACEMENT  NOTE  Date:  09/19/2017  Patient Details  Name: Cindy Sullivan MRN: [id number] Date of Birth: 02-21-1929  Clinical Social Work is seeking post-discharge placement for this patient at the [place] level of care (*CSW will initial, date and re-position this form in  chart as items are completed):  Yes   Patient/family provided with [facility] Work Department's list of facilities offering this level of care within the geographic area requested by the patient (or if unable, by the patient's family).  Yes   Patient/family informed of their freedom to choose among providers that offer the needed level of care, that participate in Medicare, Medicaid or managed care program needed by the patient, have an available bed and are willing to accept the patient.  Yes   Patient/family informed of [facility]'s ownership interest in [facility] and [facility], as well as of the fact that they are under no obligation to receive care at these facilities.  PASRR submitted to EDS on       PASRR number received on       Existing PASRR number confirmed on 09/19/17     FL2 transmitted to all facilities in geographic area requested by pt/family on 09/19/17     FL2 transmitted to all facilities within larger geographic area on       Patient informed that his/her managed care company has contracts with or will negotiate with certain facilities, including the following:            Patient/family informed of bed offers received.  Patient chooses bed at       Physician recommends and patient chooses bed at      Patient to be transferred to   on  .  Patient to be transferred to facility by       Patient family notified on   of transfer.  Name of family member notified:        PHYSICIAN       Additional Comment:    _______________________________________________ Bryker Fletchall, Veronia Beets, LCSW 09/19/2017, 3:16 PM

## 2017-09-19 NOTE — Clinical Social Work Note (Signed)
Clinical Social Work Assessment  Patient Details  Name: Cindy Sullivan MRN: [id number] Date of Birth: 01-05-1929  Date of referral:  09/19/17               Reason for consult:  Facility Placement                Permission sought to share information with:  Chartered certified accountant granted to share information::  Yes, Verbal Permission Granted  Name::      [name]::   [name]   Relationship::     Contact Information:     Housing/Transportation Living arrangements for the past 2 months:  [name]:  Patient, Adult Children Patient Interpreter Needed:  None Criminal Activity/Legal Involvement Pertinent to Current Situation/Hospitalization:  No - Comment as needed Significant Relationships:  Adult Children, Siblings Lives with:  Self Do you feel safe going back to the place where you live?  Yes Need for family participation in patient care:  Yes (Comment)  Care giving concerns:  Patient lives in [name] alone however he brother Cindy Sullivan and daughter Cindy Sullivan check on patient often.    Social Worker assessment / plan:  Holiday representative (CSW) received verbal consult from RN case manager that patient's daughter requested SNF for IV ABX. PT is recommending home health. CSW met with patient and her daughter Cindy Sullivan was at bedside. Patient was alert and oriented X4 and was laying in the bed. CSW introduced self and explained role of CSW department. Daughter reported that she lives in [place] however she comes to visit patient often. Per daughter patient's brother Cindy Sullivan is 71 years younger then patient and he stays with her often. CSW explained SNF process and that medicare requires a 3 night qualifying inpatient stay in a hospital in order to pay for SNF. Patient was admitted to inpatient on 09/17/17. Daughter is agreeable to SNF search in [name] and prefers [name] CSW made daughter aware that  [name] may not accept patient because they don't have a lot of private rooms. Daughter verbalized her understanding. FL2 complete and faxed out. CSW will continue to follow and assist as needed.    Employment status:  Retired Forensic scientist:  Medicare PT Recommendations:  Home with [place] / Referral to community resources:  [name]  Patient/Family's Response to care:  Patient's daughter is agreeable to SNF search in [name].   Patient/Family's Understanding of and Emotional Response to Diagnosis, Current Treatment, and Prognosis:  Patient and her daughter were very pleasant and thanked CSW for assistance.   Emotional Assessment Appearance:  Appears stated age Attitude/Demeanor/Rapport:    Affect (typically observed):  Accepting, Adaptable, Pleasant Orientation:  Oriented to Self, Oriented to Place, Oriented to  Time, Oriented to Situation Alcohol / Substance use:  Not Applicable Psych involvement (Current and /or in the community):  No (Comment)  Discharge Needs  Concerns to be addressed:  Discharge Planning Concerns Readmission within the last 30 days:  No Current discharge risk:  Chronically ill Barriers to Discharge:  Continued Medical Work up   [employer], Veronia Beets, LCSW 09/19/2017, 3:17 PM

## 2017-09-20 LAB — URINE CULTURE: Culture: >=100000 — AB

## 2017-09-20 LAB — GLUCOSE, CAPILLARY: GLUCOSE-CAPILLARY: 125 mg/dL — AB (ref 65–99)

## 2017-09-20 MED ORDER — POLYETHYLENE GLYCOL 3350 17 G PO PACK: 17.0000 g | PACK | Freq: Every day | ORAL | 0 refills | Status: DC | PRN

## 2017-09-20 MED ORDER — SODIUM CHLORIDE 0.9 % IV SOLN: 1.0000 g | Freq: Two times a day (BID) | INTRAVENOUS | 0 refills | Status: AC

## 2017-09-20 MED ORDER — INSULIN ASPART PROT & ASPART (70-30 MIX) 100 UNIT/ML ~~LOC~~ SUSP: 12.0000 [IU] | Freq: Every day | SUBCUTANEOUS | 11 refills | Status: DC

## 2017-09-20 NOTE — Progress Notes (Signed)
Called EMS for transport. 

## 2017-09-20 NOTE — Evaluation (Signed)
Clinical/Bedside Swallow Evaluation Patient Details  Name: Cindy Sullivan MRN: [id number] Date of Birth: 1929-02-03  Today's Date: 09/20/2017 Time: SLP Start Time (ACUTE ONLY): 0845 SLP Stop Time (ACUTE ONLY): 0945 SLP Time Calculation (min) (ACUTE ONLY): 60 min  Past Medical History:  Past Medical History:  Diagnosis Date  . Chest pain syndrome   . Chronic back pain   . Chronic diarrhea   . GERD (gastroesophageal reflux disease)   . H/O: GI bleed   . Hyperlipidemia   . Hypertension   . IDA (iron deficiency anemia)   . Insomnia   . Non-insulin dependent type 2 diabetes mellitus ([place])   . Pancreatitis    Past Surgical History:  Past Surgical History:  Procedure Laterality Date  . APPENDECTOMY    . CHOLECYSTECTOMY    . EXPLORATORY LAPAROTOMY    . HERNIA REPAIR     x 2  . KNEE ARTHROSCOPY Left   . TOTAL ABDOMINAL HYSTERECTOMY W/ BILATERAL SALPINGOOPHORECTOMY     one ovary still left   HPI:  Pt is a 83 y.o. female with a known history of GERD, insomnia, HTN and other issues who presented to the emergency room from living facility for IV antibiotics, patient was called back for Pseudomonas UTI only sensitive to IV antibiotics-patient with allergy to Cipro, patient with one-week history of worsening urinary frequency, pain with urination, lower abdominal pain/discomfort, patient nontoxic-appearing on examination, daughter at the bedside, patient is now being admitted for acute UTI-only sensitivities to IV antibiotics given allergy to Cipro. Per pt, she reported she has recently been dx'd w/ Thrush per her PCP and taking a medicaion for it - known to have recently been on antibiotics for UTI. Pt c/o a Sore Throat and discomfort swallowing which is now improved per her report. Per pt's chart, pt is on the medication Namenda - suspect d/t Cognitive decline(?). NSG reported difficulty swallowing Pills yesterday; pt takes them crushed at home in applesauce per her PCP advice.    Assessment /  Plan / Recommendation Clinical Impression  Pt appears to present w/ adequate oropharyngeal phase swallow function for thin liquids and soft, broken-down foods (moistened well) and appears at reduced risk for aspiration when following general aspiration precautions. Pt exhibited no overt s/s of aspiration w/ any trial consistencies of food or liquid; no decline in vocal quality or respiratory status w/ her oral intake. During the oral phase w/ trials of increased textured foods, pt exhibited increased oral phase time for mastication of the solid foods b/f transfer for swallowing. Given time and alternating foods/moister foods/liquids, pt was able to adequately masticate and clear soft solid trials. Pt does not wear dentures and his missing her dentition for sufficient/effective mastication. Pt fed self given tray setup and reducing room distractions to allow pt to focus on feeding self. For a more easily masticated diet consistency, recommend a more broken-down diet consistency of Level 2(minced) w/ thin liquids; general aspiration precautions; Pills in puree (Crushed as able) - pt exhibited increased oral phase time and tilted head back when taking Whole in Puree(this is a choking risk). Suspect pt's recent declined oral intake could be impacted by her oral Thrush dx (per pt report) and the discomfort as well as pt's baseline Cognitive decline impacted by UTI. Pt appears to be much improved as she is eating an oral diet currently and swallowing her Pills in Puree(crushed recommended however). No further skilled ST services indicated at this time. NSG will reconsult if a change in  status while admitted. NSG/MD updated.  SLP Visit Diagnosis: Dysphagia, oral phase (R13.11)(baseline Cognitive issues)    Aspiration Risk  (reduced when following aspiration precautions)    Diet Recommendation  Dysphagia level 2 (minced) moistened well; Thin liquids. General aspiration precautions; Reflux precautions. Reduce  distractions at all meals.   Medication Administration: Crushed with puree(as able)    Other  Recommendations Recommended Consults: (Dietician consult for support) Oral Care Recommendations: Oral care BID;Patient independent with oral care;Staff/trained caregiver to provide oral care Other Recommendations: (n/a)   Follow up Recommendations None      Frequency and Duration (n/a)  (n/a)       Prognosis Prognosis for Safe Diet Advancement: Good Barriers to Reach Goals: Cognitive deficits      Swallow Study   General Date of Onset: 09/17/17 HPI: Pt is a 83 y.o. female with a known history of GERD, insomnia, HTN and other issues who presented to the emergency room from living facility for IV antibiotics, patient was called back for Pseudomonas UTI only sensitive to IV antibiotics-patient with allergy to Cipro, patient with one-week history of worsening urinary frequency, pain with urination, lower abdominal pain/discomfort, patient nontoxic-appearing on examination, daughter at the bedside, patient is now being admitted for acute UTI-only sensitivities to IV antibiotics given allergy to Cipro. Per pt, she reported she has recently been dx'd w/ Thrush per her PCP and taking a medicaion for it - known to have recently been on antibiotics for UTI. Pt c/o a Sore Throat and discomfort swallowing which is now improved per her report. Per pt's chart, pt is on the medication Namenda - suspect d/t Cognitive decline(?). NSG reported difficulty swallowing Pills yesterday; pt takes them crushed at home in applesauce per her PCP advice.  Type of Study: Bedside Swallow Evaluation Previous Swallow Assessment: none Diet Prior to this Study: Regular;Thin liquids(softer foods per pt) Temperature Spikes Noted: No(wbc elevated) Respiratory Status: Room air History of Recent Intubation: No Behavior/Cognition: Alert;Cooperative;Pleasant mood;Distractible;Requires cueing( baseline on Namenda) Oral Cavity  Assessment: Within Functional Limits Oral Care Completed by SLP: Recent completion by staff Oral Cavity - Dentition: Missing dentition;Edentulous(does not wear dentures) Vision: Functional for self-feeding Self-Feeding Abilities: Able to feed self;Needs set up Patient Positioning: Upright in bed Baseline Vocal Quality: Normal Volitional Cough: Strong Volitional Swallow: Able to elicit    Oral/Motor/Sensory Function Overall Oral Motor/Sensory Function: Within functional limits   Ice Chips Ice chips: Within functional limits Presentation: Spoon(fed; 2 trials)   Thin Liquid Thin Liquid: Within functional limits Presentation: Cup;Self Fed;Straw(~5-6 ozs total w/ juices and water; Ensure)    Nectar Thick Nectar Thick Liquid: Not tested   Honey Thick Honey Thick Liquid: Not tested   Puree Puree: Within functional limits Presentation: Self Fed;Spoon(~6+ ozs total of grits and applesauce)   Solid   GO   Solid: Impaired Presentation: Self Fed;Spoon(7-8 trials of eggs and muffin) Oral Phase Impairments: Impaired mastication Oral Phase Functional Implications: Impaired mastication(increased time needed; moistening) Pharyngeal Phase Impairments: (none)    Functional Assessment Tool Used: clinical judgement Functional Limitations: Swallowing Swallow Current Status (A4166): At least 1 percent but less than 20 percent impaired, limited or restricted Swallow Goal Status [id number]): At least 1 percent but less than 20 percent impaired, limited or restricted Swallow Discharge Status [phone]): At least 1 percent but less than 20 percent impaired, limited or restricted      Orinda Kenner, MS, CCC-SLP Watson,Katherine 09/20/2017,11:49 AM

## 2017-09-20 NOTE — Progress Notes (Signed)
Patient is medically stable for D/C to [facility] today. Per [facility] liaison patient can come today to room 810. RN will call report and arrange EMS for transport. Clinical Education officer, museum (CSW) sent D/C orders to [facility] via [facility]. Patient is aware of above. Patient's daughter Tye [place] is aware of above. Please reconsult if future social work needs arise. CSW signing off.   [employer], LCSW [phone]

## 2017-09-20 NOTE — Progress Notes (Signed)
Called report to [place], LPN at [facility]. Answered all questions. EMS previously call for transport

## 2017-09-20 NOTE — Discharge Summary (Signed)
[name]    MR#:  [id number]  DATE OF BIRTH:  01/12/1929  DATE OF ADMISSION:  09/17/2017 ADMITTING PHYSICIAN: Gorden Harms, MD  DATE OF DISCHARGE: 09/20/2017  PRIMARY CARE PHYSICIAN: Glendon Axe, MD    ADMISSION DIAGNOSIS:  Pyelonephritis [N12]  DISCHARGE DIAGNOSIS:  Recurrent UTI---pseudomonas  SECONDARY DIAGNOSIS:   Past Medical History:  Diagnosis Date  . Chest pain syndrome   . Chronic back pain   . Chronic diarrhea   . GERD (gastroesophageal reflux disease)   . H/O: GI bleed   . Hyperlipidemia   . Hypertension   . IDA (iron deficiency anemia)   . Insomnia   . Non-insulin dependent type 2 diabetes mellitus ([place])   . Pancreatitis     HOSPITAL COURSE:  Cindy Sullivan y.o.femalewith a known history per below presented to the emergency room for IV antibiotics, patient was called back for Pseudomonas UTI only sensitive to IV antibiotics-patient with allergy to Cipro,patient with one-week history of worsening urinary frequency, pain with urination, lower abdominal pain/discomfort  1.pseudomonal UTI IV meropenem 1 g bid--for total 10 days -pt has port  2acute hyponatremia/hypochloremia Recieved IV fluids andlabs are improving  3chronic diabetes mellitus type 2 Continue Lantus 12 units SQ daily sliding scale insulin with Accu-Cheks per routine  4chronic GERD without esophagitis PPI daily  DNR status Condition stable Prognosis fair DVT prophylaxis with heparin subcu  Pt will go to Rehab today--[facility] resource   dter Tye Maryland Spearing aware of d/c plans    CONSULTS OBTAINED:    DRUG ALLERGIES:   Allergies  Allergen Reactions  . Ciprofloxacin Hives  . Codeine Other (See Comments)    Other reaction(s): Headache Reaction:  Headaches   . Escitalopram Oxalate     Other reaction(s): Other (See Comments) Agitation  . Metronidazole Nausea Only  . Other      Other reaction(s): Unknown anti- Inflammatory and bee stings  . Pantoprazole Nausea Only  . Phenergan [Promethazine Hcl] Other (See Comments)    Reaction:  Unknown   . Sulfamethoxazole-Trimethoprim     Other reaction(s): Unknown    DISCHARGE MEDICATIONS:   Current Discharge Medication List    START taking these medications   Details  insulin aspart protamine- aspart (NOVOLOG MIX 70/30) (70-30) 100 UNIT/ML injection Inject 0.12 mLs (12 Units total) into the skin daily with breakfast. Qty: 10 mL, Refills: 11    meropenem 1 g in sodium chloride 0.9 % 100 mL Inject 1 g into the vein every 12 (twelve) hours for 6 days. Qty: 12 application, Refills: 0    polyethylene glycol (MIRALAX / GLYCOLAX) packet Take 17 g by mouth daily as needed for mild constipation. Qty: 14 each, Refills: 0      CONTINUE these medications which have NOT CHANGED   Details  acetaminophen (TYLENOL) 500 MG tablet Take 500 mg by mouth 2 (two) times daily.     atorvastatin (LIPITOR) 20 MG tablet Take 20 mg by mouth at bedtime.     conjugated estrogens (PREMARIN) vaginal cream Place 1 Applicatorful vaginally daily. Use pea sized amount M-W-Fr before bedtime Qty: 42.5 g, Refills: 12   Associated Diagnoses: Recurrent UTI    insulin regular (NOVOLIN R,HUMULIN R) 100 units/mL injection Inject 2-10 Units into the skin as needed for high blood sugar. Pt uses as needed per sliding scale:    151-200:  2 units  201-250:  4 units 251-300:  6 units  301-350:  8 units 351-400:  10 units    memantine (NAMENDA) 5 MG tablet Take 5 mg by mouth 2 (two) times daily.     omeprazole (PRILOSEC) 40 MG capsule Take 40 mg by mouth daily.     oxybutynin (DITROPAN-XL) 5 MG 24 hr tablet Take 5 mg by mouth 2 (two) times daily.     traMADol (ULTRAM) 50 MG tablet Take 1 tablet (50 mg total) by mouth every 6 (six) hours as needed for moderate pain. Qty: 30 tablet, Refills: 0    irbesartan (AVAPRO) 150 MG tablet Take 150 mg by  mouth daily.      STOP taking these medications     insulin NPH-regular Human (NOVOLIN 70/30) (70-30) 100 UNIT/ML injection      nitrofurantoin, macrocrystal-monohydrate, (MACROBID) 100 MG capsule         If you experience worsening of your admission symptoms, develop shortness of breath, life threatening emergency, suicidal or homicidal thoughts you must seek medical attention immediately by calling 911 or calling your MD immediately  if symptoms less severe.  You Must read complete instructions/literature along with all the possible adverse reactions/side effects for all the Medicines you take and that have been prescribed to you. Take any new Medicines after you have completely understood and accept all the possible adverse reactions/side effects.   Please note  You were cared for by a hospitalist during your hospital stay. If you have any questions about your discharge medications or the care you received while you were in the hospital after you are discharged, you can call the unit and asked to speak with the hospitalist on call if the hospitalist that took care of you is not available. Once you are discharged, your primary care physician will handle any further medical issues. Please note that NO REFILLS for any discharge medications will be authorized once you are discharged, as it is imperative that you return to your primary care physician (or establish a relationship with a primary care physician if you do not have one) for your aftercare needs so that they can reassess your need for medications and monitor your lab values. Today   SUBJECTIVE   Doing ok  VITAL SIGNS:  Blood pressure (!) 130/58, pulse 89, temperature 98.9 F (37.2 C), temperature source Oral, resp. rate 19, height 4\' 9"  (1.448 m), weight 54.6 kg (120 lb 6.4 oz), SpO2 99 %.  I/O:    Intake/Output Summary (Last 24 hours) at 09/20/2017 1021 Last data filed at 09/20/2017 0557 Gross per 24 hour  Intake 1910 ml   Output -  Net 1910 ml    PHYSICAL EXAMINATION:  GENERAL:  83 y.o.-year-old patient lying in the bed with no acute distress.  EYES: Pupils equal, round, reactive to light and accommodation. No scleral icterus. Extraocular muscles intact.  HEENT: Head atraumatic, normocephalic. Oropharynx and nasopharynx clear. Poor teeth NECK:  Supple, no jugular venous distention. No thyroid enlargement, no tenderness.  LUNGS: Normal breath sounds bilaterally, no wheezing, rales,rhonchi or crepitation. No use of accessory muscles of respiration. Right chest pot + CARDIOVASCULAR: S1, S2 normal. No murmurs, rubs, or gallops.  ABDOMEN: Soft, non-tender, non-distended. Bowel sounds present. No organomegaly or mass.  EXTREMITIES: No pedal edema, cyanosis, or clubbing.  NEUROLOGIC: Cranial nerves II through XII are intact. Muscle strength 5/5 in all extremities. Sensation intact. Gait not checked.  PSYCHIATRIC: The patient is alert and oriented x 3.  SKIN: No obvious rash, lesion, or ulcer.   DATA REVIEW:  CBC  Recent Labs  Lab 09/17/17 1859  WBC 12.7*  HGB 12.6  HCT 38.1  PLT 390    Chemistries  Recent Labs  Lab 09/18/17 0534  NA 133*  K 4.1  CL 100*  CO2 26  GLUCOSE 179*  BUN 15  CREATININE 0.98  CALCIUM 8.2*    Microbiology Results   Recent Results (from the past 240 hour(s))  Urine culture     Status: Abnormal   Collection Time: 09/11/17  6:24 PM  Result Value Ref Range Status   Specimen Description URINE, RANDOM  Final   Special Requests NONE  Final   Culture >=100,000 COLONIES/mL PSEUDOMONAS AERUGINOSA (A)  Final   Report Status 09/14/2017 FINAL  Final   Organism ID, Bacteria PSEUDOMONAS AERUGINOSA (A)  Final      Susceptibility   Pseudomonas aeruginosa - MIC*    CEFTAZIDIME 4 SENSITIVE Sensitive     CIPROFLOXACIN 0.5 SENSITIVE Sensitive     GENTAMICIN 4 SENSITIVE Sensitive     IMIPENEM 2 SENSITIVE Sensitive     PIP/TAZO 8 SENSITIVE Sensitive     CEFEPIME 8 SENSITIVE  Sensitive     * >=100,000 COLONIES/mL PSEUDOMONAS AERUGINOSA  Urine C&S     Status: Abnormal   Collection Time: 09/17/17 10:03 PM  Result Value Ref Range Status   Specimen Description URINE, RANDOM  Final   Special Requests NONE  Final   Culture >=100,000 COLONIES/mL PSEUDOMONAS AERUGINOSA (A)  Final   Report Status 09/20/2017 FINAL  Final   Organism ID, Bacteria PSEUDOMONAS AERUGINOSA (A)  Final      Susceptibility   Pseudomonas aeruginosa - MIC*    CEFTAZIDIME 4 SENSITIVE Sensitive     CIPROFLOXACIN 1 SENSITIVE Sensitive     GENTAMICIN 8 INTERMEDIATE Intermediate     IMIPENEM 2 SENSITIVE Sensitive     PIP/TAZO 8 SENSITIVE Sensitive     CEFEPIME 8 SENSITIVE Sensitive     * >=100,000 COLONIES/mL PSEUDOMONAS AERUGINOSA    RADIOLOGY:  No results found.   Management plans discussed with the patient, family and they are in agreement.  CODE STATUS:     Code Status Orders  (From admission, onward)        Start     Ordered   09/18/17 0054  Do not attempt resuscitation (DNR)  Continuous    Question Answer Comment  In the event of cardiac or respiratory ARREST Do not call a "code blue"   In the event of cardiac or respiratory ARREST Do not perform Intubation, CPR, defibrillation or ACLS   In the event of cardiac or respiratory ARREST Use medication by any route, position, wound care, and other measures to relive pain and suffering. May use oxygen, suction and manual treatment of airway obstruction as needed for comfort.      09/18/17 0054    Code Status History    Date Active Date Inactive Code Status Order ID Comments User Context   06/27/2017 20:10 06/29/2017 21:18 Full Code [id number]  Epifanio Lesches, MD ED   05/01/2017 01:20 05/02/2017 14:46 Full Code [id number]  Harvie Bridge, DO Inpatient   04/13/2016 21:15 04/17/2016 18:30 Full Code [id number]  Henreitta Leber, MD Inpatient      TOTAL TIME TAKING CARE OF THIS PATIENT: *40 minutes.    Fritzi Mandes M.D on 09/20/2017 at  10:21 AM  Between 7am to 6pm - Pager - [phone] After 6pm go to [email or web address] - Proofreader  [employer]  [phone]  CC: Primary care physician; Glendon Axe, MD

## 2017-09-20 NOTE — Progress Notes (Signed)
Verified of Port a cath accessed at [facility]

## 2017-09-20 NOTE — Progress Notes (Signed)
Per [facility] liaison they can accept patient with a Port cath for IV ABX.   [employer], LCSW [phone]

## 2017-09-20 NOTE — Clinical Social Work Placement (Signed)
   CLINICAL SOCIAL WORK PLACEMENT  NOTE  Date:  09/20/2017  Patient Details  Name: Cindy Sullivan MRN: [id number] Date of Birth: [birth year]  Clinical Social Work is seeking post-discharge placement for this patient at the [place] level of care (*CSW will initial, date and re-position this form in  chart as items are completed):  Yes   Patient/family provided with [facility] Work Department's list of facilities offering this level of care within the geographic area requested by the patient (or if unable, by the patient's family).  Yes   Patient/family informed of their freedom to choose among providers that offer the needed level of care, that participate in Medicare, Medicaid or managed care program needed by the patient, have an available bed and are willing to accept the patient.  Yes   Patient/family informed of [facility]'s ownership interest in [facility] and [facility], as well as of the fact that they are under no obligation to receive care at these facilities.  PASRR submitted to EDS on       PASRR number received on       Existing PASRR number confirmed on 09/19/17     FL2 transmitted to all facilities in geographic area requested by pt/family on 09/19/17     FL2 transmitted to all facilities within larger geographic area on       Patient informed that his/her managed care company has contracts with or will negotiate with certain facilities, including the following:        Yes   Patient/family informed of bed offers received.  Patient chooses bed at ([facility] )     Physician recommends and patient chooses bed at      Patient to be transferred to ([facility] ) on 09/20/17.  Patient to be transferred to facility by [facility] )     Patient family notified on 09/20/17 of transfer.  Name of family member notified:  (Patient's daughter Cindy Sullivan is aware of D/C to [facility] today. )     PHYSICIAN       Additional Comment:     _______________________________________________ Pearla Mckinny, Veronia Beets, LCSW 09/20/2017, 10:54 AM

## 2017-09-20 NOTE — Progress Notes (Signed)
Clinical Education officer, museum (CSW) received a call from patient's daughter Cindy Sullivan stating her concern that [facility] only has 1 star rating. CSW provided other bed offers. Daughter has now chosen [facility]. [facility] liaison is aware of accepted bed offer.   [employer], LCSW [phone]

## 2017-10-06 ENCOUNTER — Telehealth: Payer: Self-pay | Admitting: Urology

## 2017-10-06 NOTE — Telephone Encounter (Signed)
Pt was recently in hospital and treated for a UTI.  She has just finished meds.  The home health nurse with [facility] [phone] Judeen Hammans) said she could do a cath specimen if she has an order to bring in to make sure infection is gone.

## 2017-10-07 NOTE — Telephone Encounter (Signed)
Next week is fine.   Hollice Espy, MD

## 2017-10-07 NOTE — Telephone Encounter (Signed)
Do you need a cath specimen to ensure infection is gone?

## 2017-10-10 NOTE — Telephone Encounter (Signed)
Spoke with [facility] nurse who stated they will get cath specimen this week.

## 2017-10-24 ENCOUNTER — Inpatient Hospital Stay: Payer: Medicare Other

## 2017-10-26 ENCOUNTER — Telehealth: Payer: Self-pay

## 2017-10-26 NOTE — Telephone Encounter (Signed)
Spoke with Eddie Dibbles in reference to why prophylactic abx were not given/going to be given. Inquired about pt seeing/going to see Dr. Ola Spurr. Eddie Dibbles stated that Dr. Blane Ohara office told him that Dr. Ola Spurr does not see recurrent UTI pts and refused to make pt an appt. Eddie Dibbles then stated that he does not think pt can make it to [facility] to see ID. Eddie Dibbles voiced understanding about abx situation. Do you want me to call Dr. Blane Ohara office?

## 2017-10-26 NOTE — Telephone Encounter (Signed)
Pt brother called and left a message on triage line stating home health nurse was out today checking on pt and recommended pt to be on a prophylactic abx. Brother was requesting prophylactic abx.  Please advise.

## 2017-10-26 NOTE — Telephone Encounter (Signed)
I am not sure who specifically recommended this, but this is not something that I generally do, especially for patients with a history of multidrug-resistant infections (personal history of ESBL E. Coli).  I would recommend making appointment to discuss this further.  Alternatively, I believe she is supposed to be seeing Dr. Ola Spurr in follow-up who is an infectious disease doctor and may also have some input on this.  No prescriptions at this point in time.  Hollice Espy, MD

## 2017-10-26 NOTE — Telephone Encounter (Signed)
No that's ok.  I just saw a note from Dr.Fitzgeralds office requesting follow up so assumed they were being seen.    Please make a follow up with me if he would like to discuss this further.    Hollice Espy, MD

## 2017-10-27 NOTE — Telephone Encounter (Signed)
Spoke with Cindy Sullivan in reference to discussing daily abx with Dr. Erlene Quan. Cindy Sullivan voiced understanding. OV appt made.

## 2017-11-04 ENCOUNTER — Encounter: Payer: Self-pay | Admitting: Urology

## 2017-11-04 ENCOUNTER — Ambulatory Visit (INDEPENDENT_AMBULATORY_CARE_PROVIDER_SITE_OTHER): Payer: Medicare Other | Admitting: Urology

## 2017-11-04 VITALS — BP 137/76 | HR 83 | Ht <= 58 in | Wt 102.0 lb

## 2017-11-04 DIAGNOSIS — N390 Urinary tract infection, site not specified: Secondary | ICD-10-CM | POA: Diagnosis not present

## 2017-11-06 NOTE — Progress Notes (Signed)
11/04/2017 1:28 PM   Cindy Sullivan [birth year] [id number]  Referring provider: Glendon Axe, MD [facility] [place], [place] [address]  Chief Complaint  Patient presents with  . Other    discuss tx options     HPI: Cindy Sullivan 84 year old female who presents today for hospital follow-up.  She is been admitted now 3 times this year for multidrug-resistant urinary tract infections.  Most recently, she was admitted for pseudomonal UTI sensitive to only IV antibiotics.  Previous UTIs including ESBL E. Coli and Klebsiella.  Following this recent admission, she was discharged to rehab.  She is recently returned home.  During the discharge from rehab, someone suggested that she should be on prophylactic antibiotics.  Cindy Sullivan and her son return to the office today to discuss this in person.  She continues to take daily probiotics and cranberry tablets.  She was prescribed topical estrogen cream but this is not been administered as the family caretaker willing to apply this medication does not like the patient and it was often missed or never used.  They are wondering if a home health nurse could apply the estrogen cream.  She continues to have urinary incontinence and wears a depends which is chronic.  She previously had fecal incontinence as well but this is resolved.  Today, she reports that she is doing very well.  Her energy level has returned.  She does have urinary frequency which is baseline but without dysuria or abdominal pain as with her previous infections.  No flank tenderness.   PMH: Past Medical History:  Diagnosis Date  . Chest pain syndrome   . Chronic back pain   . Chronic diarrhea   . GERD (gastroesophageal reflux disease)   . H/O: GI bleed   . Hyperlipidemia   . Hypertension   . IDA (iron deficiency anemia)   . Insomnia   . Non-insulin dependent type 2 diabetes mellitus ([place])   . Pancreatitis     Surgical History: Past Surgical  History:  Procedure Laterality Date  . APPENDECTOMY    . CHOLECYSTECTOMY    . EXPLORATORY LAPAROTOMY    . HERNIA REPAIR     x 2  . KNEE ARTHROSCOPY Left   . TOTAL ABDOMINAL HYSTERECTOMY W/ BILATERAL SALPINGOOPHORECTOMY     one ovary still left    Home Medications:  Allergies as of 11/04/2017      Reactions   Ciprofloxacin Hives   Codeine Other (See Comments)   Other reaction(s): Headache Reaction:  Headaches    Escitalopram Oxalate    Other reaction(s): Other (See Comments) Agitation   Metronidazole Nausea Only   Other    Other reaction(s): Unknown anti- Inflammatory and bee stings   Pantoprazole Nausea Only   Phenergan [promethazine Hcl] Other (See Comments)   Reaction:  Unknown    Sulfamethoxazole-trimethoprim    Other reaction(s): Unknown      Medication List        Accurate as of 11/04/17 11:59 PM. Always use your most recent med list.          acetaminophen 500 MG tablet Commonly known as:  TYLENOL Take 500 mg by mouth 2 (two) times daily.   atorvastatin 20 MG tablet Commonly known as:  LIPITOR Take 20 mg by mouth at bedtime.   conjugated estrogens vaginal cream Commonly known as:  PREMARIN Place 1 Applicatorful vaginally daily. Use pea sized amount M-W-Fr before bedtime   insulin aspart protamine- aspart (70-30) 100 UNIT/ML injection  Commonly known as:  NOVOLOG MIX 70/30 Inject 0.12 mLs (12 Units total) into the skin daily with breakfast.   insulin regular 100 units/mL injection Commonly known as:  NOVOLIN R,HUMULIN R Inject 2-10 Units into the skin as needed for high blood sugar. Pt uses as needed per sliding scale:    151-200:  2 units  201-250:  4 units 251-300:  6 units  301-350:  8 units 351-400:  10 units   memantine 5 MG tablet Commonly known as:  NAMENDA Take 5 mg by mouth 2 (two) times daily.   omeprazole 40 MG capsule Commonly known as:  PRILOSEC Take 40 mg by mouth daily.   oxybutynin 5 MG 24 hr tablet Commonly known as:   DITROPAN-XL Take 5 mg by mouth 2 (two) times daily.   polyethylene glycol packet Commonly known as:  MIRALAX / GLYCOLAX Take 17 g by mouth daily as needed for mild constipation.   traMADol 50 MG tablet Commonly known as:  ULTRAM Take 1 tablet (50 mg total) by mouth every 6 (six) hours as needed for moderate pain.       Allergies:  Allergies  Allergen Reactions  . Ciprofloxacin Hives  . Codeine Other (See Comments)    Other reaction(s): Headache Reaction:  Headaches   . Escitalopram Oxalate     Other reaction(s): Other (See Comments) Agitation  . Metronidazole Nausea Only  . Other     Other reaction(s): Unknown anti- Inflammatory and bee stings  . Pantoprazole Nausea Only  . Phenergan [Promethazine Hcl] Other (See Comments)    Reaction:  Unknown   . Sulfamethoxazole-Trimethoprim     Other reaction(s): Unknown    Family History: Family History  Problem Relation Age of Onset  . Prostate cancer Neg Hx   . Bladder Cancer Neg Hx   . Kidney cancer Neg Hx     Social History:  reports that  has never smoked. she has never used smokeless tobacco. She reports that she does not drink alcohol or use drugs.  ROS: UROLOGY Frequent Urination?: Yes Hard to postpone urination?: Yes Burning/pain with urination?: No Get up at night to urinate?: Yes Leakage of urine?: Yes Urine stream starts and stops?: No Trouble starting stream?: No Do you have to strain to urinate?: No Blood in urine?: No Urinary tract infection?: No Sexually transmitted disease?: No Injury to kidneys or bladder?: No Painful intercourse?: No Weak stream?: No Currently pregnant?: No Vaginal bleeding?: No Last menstrual period?: n  Gastrointestinal Nausea?: No Vomiting?: No Indigestion/heartburn?: No Diarrhea?: No Constipation?: No  Constitutional Fever: No Night sweats?: No Weight loss?: No Fatigue?: No  Skin Skin rash/lesions?: No Itching?: No  Eyes Blurred vision?: No Double  vision?: No  Ears/Nose/Throat Sore throat?: No Sinus problems?: No  Hematologic/Lymphatic Swollen glands?: No Easy bruising?: No  Cardiovascular Leg swelling?: No Chest pain?: No  Respiratory Cough?: No Shortness of breath?: No  Endocrine Excessive thirst?: No  Musculoskeletal Back pain?: Yes Joint pain?: Yes  Neurological Headaches?: No Dizziness?: No  Psychologic Depression?: No Anxiety?: No  Physical Exam: BP 137/76   Pulse 83   Ht 4\' 9"  (1.448 m)   Wt 102 lb (46.3 kg)   BMI 22.07 kg/m   Constitutional:  Alert and oriented, No acute distress.  Elderly, in wheelchair.  Accompanied by her son today HEENT: [place] AT, moist mucus membranes.  Trachea midline, no masses. Respiratory: Normal respiratory effort, no increased work of breathing. Skin: No rashes, bruises or suspicious lesions. Neurologic: Grossly intact,  no focal deficits, moving all 4 extremities. Psychiatric: Normal mood and affect.  Laboratory Data: Lab Results  Component Value Date   WBC 12.7 (H) 09/17/2017   HGB 12.6 09/17/2017   HCT 38.1 09/17/2017   MCV 84.4 09/17/2017   PLT 390 09/17/2017    Lab Results  Component Value Date   CREATININE 0.98 09/18/2017    Lab Results  Component Value Date   HGBA1C 7.5 (H) 01/25/2012    Urinalysis Asymptomatic today therefore UA not obtained  Pertinent Imaging: N/A  Assessment & Plan:    1. Recurrent UTI Urine culture data reviewed All previous urine cultures resistant to oral antibiotics thus daily prophylaxis will likely not be helpful and may be harmful-additionally, concept of antibiotic stewardship was reviewed  In addition to the measures she is undertaken including probiotics and cranberry tablets, I explained that the most data driven intervention for recurrent urinary tract infections and UTI prevention in post menopausal women continues to be topical estrogen cream We discussed and reviewed how to apply this medication-he will work  with home health nursing to see if they can apply this medication Monday-Wednesday-Friday Continue to recommend same-day visit for any UTI symptoms and plan for catheterized UA/urine culture   Follow-up as needed  Hollice Espy, MD  [facility] [address], [place], [place] [address] [phone]

## 2017-12-06 ENCOUNTER — Ambulatory Visit (INDEPENDENT_AMBULATORY_CARE_PROVIDER_SITE_OTHER): Payer: Medicare Other

## 2017-12-06 VITALS — BP 78/43 | HR 73 | Ht <= 58 in | Wt 102.0 lb

## 2017-12-06 DIAGNOSIS — N390 Urinary tract infection, site not specified: Secondary | ICD-10-CM

## 2017-12-06 LAB — URINALYSIS, COMPLETE
Bilirubin, UA: NEGATIVE
Glucose, UA: NEGATIVE
Ketones, UA: NEGATIVE
NITRITE UA: NEGATIVE
Specific Gravity, UA: 1.02 (ref 1.005–1.030)
Urobilinogen, Ur: 0.2 mg/dL (ref 0.2–1.0)
pH, UA: 5.5 (ref 5.0–7.5)

## 2017-12-06 LAB — MICROSCOPIC EXAMINATION
EPITHELIAL CELLS (NON RENAL): NONE SEEN /HPF (ref 0–10)
WBC, UA: >30 /hpf — ABNORMAL HIGH (ref 0–?)

## 2017-12-06 MED ORDER — AMOXICILLIN-POT CLAVULANATE 875-125 MG PO TABS: 1.0000 | ORAL_TABLET | Freq: Two times a day (BID) | ORAL | 0 refills | Status: DC

## 2017-12-06 NOTE — Progress Notes (Addendum)
Pt presents today with c/o urinary frequency, urgency, hard to postpone urination, leakage of urine, and back pain. A cath specimen was obtained for u/a and cx.  Blood pressure (!) 78/43, pulse 73, height 4\' 9"  (1.448 m), weight 102 lb (46.3 kg).  Per [place] [place] augmentin bid x7.

## 2017-12-06 NOTE — Addendum Note (Signed)
Addended by: Lestine Box on: 12/06/2017 02:22 PM   Modules accepted: Orders

## 2017-12-08 ENCOUNTER — Encounter: Payer: Self-pay | Admitting: Emergency Medicine

## 2017-12-08 ENCOUNTER — Other Ambulatory Visit: Payer: Self-pay

## 2017-12-08 ENCOUNTER — Inpatient Hospital Stay
Admission: EM | Admit: 2017-12-08 | Discharge: 2017-12-13 | DRG: 371 | Disposition: A | Discharge status: Skilled Nursing Facility | Payer: Medicare Other | Attending: Internal Medicine | Admitting: Internal Medicine

## 2017-12-08 ENCOUNTER — Emergency Department: Payer: Medicare Other

## 2017-12-08 DIAGNOSIS — E119 Type 2 diabetes mellitus without complications: Secondary | ICD-10-CM | POA: Diagnosis present

## 2017-12-08 DIAGNOSIS — Z9079 Acquired absence of other genital organ(s): Secondary | ICD-10-CM | POA: Diagnosis not present

## 2017-12-08 DIAGNOSIS — I248 Other forms of acute ischemic heart disease: Secondary | ICD-10-CM | POA: Diagnosis present

## 2017-12-08 DIAGNOSIS — Z87440 Personal history of urinary (tract) infections: Secondary | ICD-10-CM

## 2017-12-08 DIAGNOSIS — Z79899 Other long term (current) drug therapy: Secondary | ICD-10-CM

## 2017-12-08 DIAGNOSIS — N179 Acute kidney failure, unspecified: Secondary | ICD-10-CM | POA: Diagnosis present

## 2017-12-08 DIAGNOSIS — Z90721 Acquired absence of ovaries, unilateral: Secondary | ICD-10-CM | POA: Diagnosis not present

## 2017-12-08 DIAGNOSIS — E785 Hyperlipidemia, unspecified: Secondary | ICD-10-CM | POA: Diagnosis present

## 2017-12-08 DIAGNOSIS — N390 Urinary tract infection, site not specified: Secondary | ICD-10-CM

## 2017-12-08 DIAGNOSIS — F0390 Unspecified dementia without behavioral disturbance: Secondary | ICD-10-CM | POA: Diagnosis present

## 2017-12-08 DIAGNOSIS — N289 Disorder of kidney and ureter, unspecified: Secondary | ICD-10-CM | POA: Diagnosis present

## 2017-12-08 DIAGNOSIS — Z9049 Acquired absence of other specified parts of digestive tract: Secondary | ICD-10-CM | POA: Diagnosis not present

## 2017-12-08 DIAGNOSIS — B9620 Unspecified Escherichia coli [E. coli] as the cause of diseases classified elsewhere: Secondary | ICD-10-CM | POA: Diagnosis present

## 2017-12-08 DIAGNOSIS — A0472 Enterocolitis due to Clostridium difficile, not specified as recurrent: Secondary | ICD-10-CM | POA: Diagnosis present

## 2017-12-08 DIAGNOSIS — E861 Hypovolemia: Secondary | ICD-10-CM | POA: Diagnosis present

## 2017-12-08 DIAGNOSIS — G43909 Migraine, unspecified, not intractable, without status migrainosus: Secondary | ICD-10-CM | POA: Diagnosis present

## 2017-12-08 DIAGNOSIS — G4700 Insomnia, unspecified: Secondary | ICD-10-CM | POA: Diagnosis present

## 2017-12-08 DIAGNOSIS — R7989 Other specified abnormal findings of blood chemistry: Secondary | ICD-10-CM

## 2017-12-08 DIAGNOSIS — Z90710 Acquired absence of both cervix and uterus: Secondary | ICD-10-CM

## 2017-12-08 DIAGNOSIS — R222 Localized swelling, mass and lump, trunk: Secondary | ICD-10-CM | POA: Diagnosis present

## 2017-12-08 DIAGNOSIS — N3000 Acute cystitis without hematuria: Secondary | ICD-10-CM | POA: Diagnosis present

## 2017-12-08 DIAGNOSIS — Z66 Do not resuscitate: Secondary | ICD-10-CM | POA: Diagnosis present

## 2017-12-08 DIAGNOSIS — J9859 Other diseases of mediastinum, not elsewhere classified: Secondary | ICD-10-CM

## 2017-12-08 DIAGNOSIS — N939 Abnormal uterine and vaginal bleeding, unspecified: Secondary | ICD-10-CM | POA: Diagnosis present

## 2017-12-08 DIAGNOSIS — Z794 Long term (current) use of insulin: Secondary | ICD-10-CM | POA: Diagnosis not present

## 2017-12-08 DIAGNOSIS — I10 Essential (primary) hypertension: Secondary | ICD-10-CM | POA: Diagnosis present

## 2017-12-08 DIAGNOSIS — I5189 Other ill-defined heart diseases: Secondary | ICD-10-CM

## 2017-12-08 DIAGNOSIS — G9341 Metabolic encephalopathy: Secondary | ICD-10-CM | POA: Diagnosis present

## 2017-12-08 DIAGNOSIS — K219 Gastro-esophageal reflux disease without esophagitis: Secondary | ICD-10-CM | POA: Diagnosis present

## 2017-12-08 DIAGNOSIS — E871 Hypo-osmolality and hyponatremia: Secondary | ICD-10-CM | POA: Diagnosis present

## 2017-12-08 DIAGNOSIS — K529 Noninfective gastroenteritis and colitis, unspecified: Secondary | ICD-10-CM | POA: Diagnosis present

## 2017-12-08 DIAGNOSIS — E860 Dehydration: Secondary | ICD-10-CM | POA: Diagnosis present

## 2017-12-08 DIAGNOSIS — M25561 Pain in right knee: Secondary | ICD-10-CM | POA: Diagnosis present

## 2017-12-08 DIAGNOSIS — R197 Diarrhea, unspecified: Secondary | ICD-10-CM

## 2017-12-08 DIAGNOSIS — R778 Other specified abnormalities of plasma proteins: Secondary | ICD-10-CM

## 2017-12-08 LAB — COMPREHENSIVE METABOLIC PANEL
ALT: 13 U/L — AB (ref 14–54)
AST: 25 U/L (ref 15–41)
Albumin: 2.7 g/dL — ABNORMAL LOW (ref 3.5–5.0)
Alkaline Phosphatase: 77 U/L (ref 38–126)
Anion gap: 9 (ref 5–15)
BUN: 43 mg/dL — ABNORMAL HIGH (ref 6–20)
CHLORIDE: 97 mmol/L — AB (ref 101–111)
CO2: 21 mmol/L — ABNORMAL LOW (ref 22–32)
CREATININE: 1.69 mg/dL — AB (ref 0.44–1.00)
Calcium: 8.3 mg/dL — ABNORMAL LOW (ref 8.9–10.3)
GFR, EST AFRICAN AMERICAN: 30 mL/min — AB (ref >60)
GFR, EST NON AFRICAN AMERICAN: 26 mL/min — AB (ref >60)
Glucose, Bld: 142 mg/dL — ABNORMAL HIGH (ref 65–99)
Potassium: 4.9 mmol/L (ref 3.5–5.1)
Sodium: 127 mmol/L — ABNORMAL LOW (ref 135–145)
Total Bilirubin: 0.4 mg/dL (ref 0.3–1.2)
Total Protein: 7 g/dL (ref 6.5–8.1)

## 2017-12-08 LAB — URINALYSIS, COMPLETE (UACMP) WITH MICROSCOPIC
Bilirubin Urine: NEGATIVE
Glucose, UA: NEGATIVE mg/dL
KETONES UR: 5 mg/dL — AB
Nitrite: NEGATIVE
PH: 5 (ref 5.0–8.0)
Protein, ur: 30 mg/dL — AB
SQUAMOUS EPITHELIAL / LPF: NONE SEEN
Specific Gravity, Urine: 1.017 (ref 1.005–1.030)

## 2017-12-08 LAB — CBC
HCT: 33.3 % — ABNORMAL LOW (ref 35.0–47.0)
Hemoglobin: 10.8 g/dL — ABNORMAL LOW (ref 12.0–16.0)
MCH: 27.3 pg (ref 26.0–34.0)
MCHC: 32.5 g/dL (ref 32.0–36.0)
MCV: 84.2 fL (ref 80.0–100.0)
PLATELETS: 398 10*3/uL (ref 150–440)
RBC: 3.96 MIL/uL (ref 3.80–5.20)
RDW: 15.2 % — AB (ref 11.5–14.5)
WBC: 16 10*3/uL — ABNORMAL HIGH (ref 3.6–11.0)

## 2017-12-08 LAB — INFLUENZA PANEL BY PCR (TYPE A & B)
INFLBPCR: NEGATIVE
Influenza A By PCR: NEGATIVE

## 2017-12-08 LAB — TROPONIN I: TROPONIN I: 0.06 ng/mL — AB (ref <0.03)

## 2017-12-08 LAB — LIPASE, BLOOD: LIPASE: 16 U/L (ref 11–51)

## 2017-12-08 MED ORDER — HYDROMORPHONE HCL 1 MG/ML IJ SOLN
0.5000 mg | Freq: Once | INTRAMUSCULAR | Status: AC
  Administered 2017-12-08: 0.5 mg via INTRAVENOUS
  Filled 2017-12-08: qty 1

## 2017-12-08 MED ORDER — SODIUM CHLORIDE 0.9 % IV SOLN
1.0000 g | Freq: Once | INTRAVENOUS | Status: AC
  Administered 2017-12-08: 1 g via INTRAVENOUS
  Filled 2017-12-08: qty 10

## 2017-12-08 MED ORDER — SODIUM CHLORIDE 0.9 % IV BOLUS (SEPSIS)
1000.0000 mL | Freq: Once | INTRAVENOUS | Status: AC
  Administered 2017-12-08: 1000 mL via INTRAVENOUS

## 2017-12-08 MED ORDER — ASPIRIN 81 MG PO CHEW: 324.0000 mg | CHEWABLE_TABLET | Freq: Once | ORAL | Status: DC

## 2017-12-08 MED ORDER — ASPIRIN 300 MG RE SUPP
RECTAL | Status: AC
  Administered 2017-12-08: 300 mg via RECTAL
  Filled 2017-12-08: qty 1

## 2017-12-08 MED ORDER — ONDANSETRON HCL 4 MG/2ML IJ SOLN
4.0000 mg | Freq: Once | INTRAMUSCULAR | Status: AC
  Administered 2017-12-08: 4 mg via INTRAVENOUS
  Filled 2017-12-08: qty 2

## 2017-12-08 MED ORDER — ASPIRIN 300 MG RE SUPP
300.0000 mg | Freq: Once | RECTAL | Status: AC
  Administered 2017-12-08: 300 mg via RECTAL

## 2017-12-08 MED ORDER — LORAZEPAM 2 MG/ML IJ SOLN
1.0000 mg | Freq: Once | INTRAMUSCULAR | Status: AC
  Administered 2017-12-08: 1 mg via INTRAVENOUS
  Filled 2017-12-08: qty 1

## 2017-12-08 MED ORDER — FENTANYL CITRATE (PF) 100 MCG/2ML IJ SOLN
50.0000 ug | Freq: Once | INTRAMUSCULAR | Status: AC
  Administered 2017-12-08: 50 ug via INTRAVENOUS
  Filled 2017-12-08: qty 2

## 2017-12-08 MED ORDER — FENTANYL CITRATE (PF) 100 MCG/2ML IJ SOLN: 50.0000 ug | Freq: Once | INTRAMUSCULAR | Status: DC

## 2017-12-08 MED ORDER — HYDROMORPHONE HCL 1 MG/ML IJ SOLN
0.2500 mg | Freq: Once | INTRAMUSCULAR | Status: AC
  Administered 2017-12-08: 0.25 mg via INTRAVENOUS
  Filled 2017-12-08: qty 1

## 2017-12-08 NOTE — ED Notes (Signed)
Date and time results received: 12/08/17 10:17 PM  (use smartphrase ".now" to insert current time)  Test:Trop I 0.06 Critical Value: 0.06  Name of Provider Notified: Mariea Clonts, EDP  Orders Received? Or Actions Taken?:  See orders for details, no new orders given at the time of report

## 2017-12-08 NOTE — ED Triage Notes (Signed)
Pt to ed from home via [facility] with reports of diarrhea starting early this am t. Pt was started on amoxicillin on Tuesday for UTI.

## 2017-12-08 NOTE — ED Provider Notes (Addendum)
[facility] Emergency Department Provider Note  ____________________________________________  Time seen: Approximately 6:26 PM  I have reviewed the triage vital signs and the nursing notes.   HISTORY  Chief Complaint Diarrhea    HPI Cindy Sullivan is a 84 y.o. female with a history of DM, HTN, status post appendectomy and hernia repair remotely presenting with diarrhea.  The patient reports that 2 days ago she was started on amoxicillin for urinary tract infection by her primary care physician.  At 330 this morning, she woke up with loose, nonbloody diarrhea and has had 3 episodes since then as well as diffuse nonfocal abdominal discomfort.  She has not had any fevers or chills, cough or cold symptoms.  She has had nausea without vomiting.  She has not tried anything for her symptoms.  Past Medical History:  Diagnosis Date  . Chest pain syndrome   . Chronic back pain   . Chronic diarrhea   . GERD (gastroesophageal reflux disease)   . H/O: GI bleed   . Hyperlipidemia   . Hypertension   . IDA (iron deficiency anemia)   . Insomnia   . Non-insulin dependent type 2 diabetes mellitus ([place])   . Pancreatitis     Patient Active Problem List   Diagnosis Date Noted  . UTI (urinary tract infection) 09/17/2017  . Sepsis ([place]) 06/27/2017  . SIRS (systemic inflammatory response syndrome) ([place]) 04/30/2017  . Type 2 diabetes mellitus ([place]) 01/18/2016  . Alzheimer's dementia, late onset 01/18/2016  . Iron deficiency anemia due to chronic blood loss 12/30/2015  . Chronic hyponatremia 04/07/2015  . Chronic kidney disease (CKD), stage III (moderate) ([place]) 04/07/2015  . Essential (primary) hypertension 04/07/2015  . Chronic diarrhea 02/03/2015  . Insomnia, persistent 02/02/2015  . Primary osteoarthritis of both knees 02/02/2015  . Pure hypercholesterolemia 09/12/2014    Past Surgical History:  Procedure Laterality Date  . APPENDECTOMY    . CHOLECYSTECTOMY    .  EXPLORATORY LAPAROTOMY    . HERNIA REPAIR     x 2  . KNEE ARTHROSCOPY Left   . TOTAL ABDOMINAL HYSTERECTOMY W/ BILATERAL SALPINGOOPHORECTOMY     one ovary still left    Current Outpatient Rx  . Order #: [id number] Class: Historical Med  . Order #: [id number] Class: Normal  . Order #: [id number] Class: Historical Med  . Order #: [id number] Class: Normal  . Order #: [id number] Class: Normal  . Order #: [id number] Class: Historical Med  . Order #: [id number] Class: Historical Med  . Order #: [id number] Class: Historical Med  . Order #: [id number] Class: Historical Med  . Order #: [id number] Class: Normal  . Order #: [id number] Class: Print    Allergies Ciprofloxacin; Codeine; Escitalopram oxalate; Metronidazole; Other; Pantoprazole; Phenergan [promethazine hcl]; and Sulfamethoxazole-trimethoprim  Family History  Problem Relation Age of Onset  . Prostate cancer Neg Hx   . Bladder Cancer Neg Hx   . Kidney cancer Neg Hx     Social History Social History   Tobacco Use  . Smoking status: Never Smoker  . Smokeless tobacco: Never Used  Substance Use Topics  . Alcohol use: No    Alcohol/week: 0.0 oz  . Drug use: No    Review of Systems Constitutional: No fever/chills.  No lightheadedness or syncope. Eyes: No visual changes. ENT: No sore throat. No congestion or rhinorrhea. Cardiovascular: Denies chest pain. Denies palpitations. Respiratory: Denies shortness of breath.  No cough. Gastrointestinal: Positive diffuse nonfocal abdominal pain.  Positive nausea, no vomiting.  Positive diarrhea.  No constipation. Genitourinary: Negative for  dysuria.  No urinary frequency. Musculoskeletal: Negative for back pain. Skin: Negative for rash. Neurological: Negative for headaches. No focal numbness, tingling or weakness.     ____________________________________________   PHYSICAL EXAM:  VITAL SIGNS: ED Triage Vitals  Enc Vitals Group     BP 12/08/17 1818 (!) 110/48     Pulse Rate 12/08/17 1818 (!)  51     Resp 12/08/17 1818 20     Temp 12/08/17 1818 98.4 F (36.9 C)     Temp Source 12/08/17 1818 Oral     SpO2 12/08/17 1818 100 %     Weight 12/08/17 1819 102 lb (46.3 kg)     Height --      Head Circumference --      Peak Flow --      Pain Score 12/08/17 1819 4     Pain Loc --      Pain Edu? --      Excl. in [place]? --     Constitutional: Alert and oriented.  Chronically ill appearing but in no acute distress. Answers questions appropriately. Eyes: Conjunctivae are normal.  EOMI. No scleral icterus. Head: Atraumatic. Nose: No congestion/rhinnorhea. Mouth/Throat: Mucous membranes are dry.  Neck: No stridor.  Supple.  No JVD.  No meningismus. Cardiovascular: Normal rate, regular rhythm. No murmurs, rubs or gallops.  Respiratory: Normal respiratory effort.  No accessory muscle use or retractions. Lungs CTAB.  No wheezes, rales or ronchi. Gastrointestinal: Soft, and nondistended.  Tender to palpation diffusely with more localized pain in the right lower quadrant.  No guarding or rebound.  No peritoneal signs. Musculoskeletal: No LE edema. Neurologic:  A&Ox3.  Speech is clear.  Face and smile are symmetric.  EOMI.  Moves all extremities well. Skin:  Skin is warm, dry and intact. No rash noted. Psychiatric: Mood and affect are normal. ____________________________________________   LABS (all labs ordered are listed, but only abnormal results are displayed)  Labs Reviewed  CBC - Abnormal; Notable for the following components:      Result Value   WBC 16.0 (*)    Hemoglobin 10.8 (*)    HCT 33.3 (*)    RDW 15.2 (*)    All other components within normal limits  COMPREHENSIVE METABOLIC PANEL - Abnormal; Notable for the following components:   Sodium 127 (*)    Chloride 97 (*)    CO2 21 (*)    Glucose, Bld 142 (*)    BUN 43 (*)    Creatinine, Ser 1.69 (*)    Calcium 8.3 (*)    Albumin 2.7 (*)    ALT 13 (*)    GFR calc non Af Amer 26 (*)    GFR calc Af Amer 30 (*)    All other  components within normal limits  URINALYSIS, COMPLETE (UACMP) WITH MICROSCOPIC - Abnormal; Notable for the following components:   Color, Urine AMBER (*)    APPearance CLOUDY (*)    Hgb urine dipstick LARGE (*)    Ketones, ur 5 (*)    Protein, ur 30 (*)    Leukocytes, UA SMALL (*)    Bacteria, UA RARE (*)    All other components within normal limits  C DIFFICILE QUICK SCREEN W PCR REFLEX  GASTROINTESTINAL PANEL BY PCR, STOOL (REPLACES STOOL CULTURE)  URINE CULTURE  LIPASE, BLOOD  INFLUENZA PANEL BY PCR (TYPE A & B)  BASIC METABOLIC PANEL  TROPONIN I   ____________________________________________  EKG The patient has significant difficulty holding still for her  EKG; this EKG is limited by a poor baseline tracing. ED ECG REPORT I, Eula Listen, the attending physician, personally viewed and interpreted this ECG.   Date: 12/08/2017  EKG Time: 2129  Rate: 95  Rhythm: abnormal rhythm which may be trigeminy, ectopic rhythm, sinus arrhythmia  Axis: normal  Intervals:none  ST&T Change: No STEMI  Repeat EKG: ED ECG REPORT I, Eula Listen, the attending physician, personally viewed and interpreted this ECG.   Date: 12/08/2017  EKG Time: 2236  Rate: 69  Rhythm: normal sinus rhythm  Axis: normal  Intervals:none  ST&T Change: No STEMI  ____________________________________________  RADIOLOGY  Ct Abdomen Pelvis Wo Contrast  Result Date: 12/08/2017 CLINICAL DATA:  Diarrhea earlier this morning. EXAM: CT ABDOMEN AND PELVIS WITHOUT CONTRAST TECHNIQUE: Multidetector CT imaging of the abdomen and pelvis was performed following the standard protocol without IV contrast. COMPARISON:  CT abdomen and pelvis from 04/30/2017 FINDINGS: Lower chest: Partially imaged 3.7 x 4.8 cm masslike abnormality along the ventral aspect of the heart, epicenter of the mass along the pericardium. Dedicated CT of the chest for further evaluation is recommended. This does not have the  typical appearance of a pericardial cyst or lipoma. Partially included lymphoma or possibly mesothelioma among some other solid pathology that may predispose to this area though not exclusive. This is not apparent on the comparison exam. Heart is borderline enlarged with left main and 3 vessel coronary arteriosclerosis. There is aortic atherosclerosis. Hepatobiliary: The unopacified liver demonstrates intrahepatic and extrahepatic ductal dilatation (to 21 mm currently) which can be seen in the prior setting of cholecystectomy. No choledocholithiasis. The patient is status post cholecystectomy. Calcified granuloma is noted in the right hepatic lobe. Pancreas: Atrophic without focal mass or ductal dilatation. Spleen: Normal Adrenals/Urinary Tract: Normal bilateral adrenal glands. No nephrolithiasis or hydronephrosis. Water attenuating 2.5 cm in diameter cyst is seen in the lower pole the left kidney unchanged in appearance. Smaller cysts are also noted of the left kidney as before. No hydroureteronephrosis. Urinary bladder is physiologically distended without calculus. Slight nodular thickening along the dependent and nondependent walls of the urinary bladder are seen. Direct visual correlation is recommended for further evaluation to exclude a flat transitional cell lesion versus focal inflammatory thickening. Stomach/Bowel: Nondistended stomach with normal small bowel rotation. Scattered colonic diverticulosis without acute diverticulitis. No bowel obstruction. Status post appendectomy by report Vascular/Lymphatic: Moderate aortoiliac atherosclerosis without aneurysm. No lymphadenopathy. Reproductive: Uterus is surgically absent. No adnexal mass is noted. Other: No free air nor free fluid. Musculoskeletal: Mild degenerative disc disease of the thoracolumbar juncture. No acute nor suspicious osseous lesions. IMPRESSION: 1. A partially included, 3.7 x 4.8 cm solid appearing masslike abnormality is noted along the  ventral aspect of the heart along the pericardium. This does not have the typical appearance of a more common epicardial cyst or lipoma. Dedicated CT with IV contrast is recommended for further correlation. More common solid pathology in this region may include a mesothelioma or partial imaging of lymphoma among some possibilities. 2. Left main and three-vessel coronary arteriosclerosis. 3. Chronic dilatation of the intrahepatic and extrahepatic biliary system status post cholecystectomy. Choledocholithiasis. 4. Water attenuating cyst of the left lower pole of the kidney measuring 2.5 cm. 5. Eccentric mural thickening of the bladder along the dependent posterior wall. Direct visual correlation is recommended to exclude a transitional cell lesion versus inflammatory thickening or dependent debris. 6. Moderate aortoiliac atherosclerosis. 7. Colonic diverticulosis without acute diverticulitis. Electronically Signed   By: Meredith Leeds.D.  On: 12/08/2017 20:26    ____________________________________________   PROCEDURES  Procedure(s) performed: None  Procedures  Critical Care performed: No ____________________________________________   INITIAL IMPRESSION / ASSESSMENT AND PLAN / ED COURSE  Pertinent labs & imaging results that were available during my care of the patient were reviewed by me and considered in my medical decision making (see chart for details).  84 y.o. female with a history of multiple prior abdominal surgeries presenting with 3 episodes of diarrhea and diffuse abdominal discomfort after initiating amoxicillin for UTI.  Overall, the patient is hemodynamically stable and afebrile.  She may be having a side effect to her amoxicillin, but I would also consider a GI or foodborne illness.  Partial small bowel obstruction is possible but less likely.  We will also evaluate her for influenza although she has not had any respiratory symptoms.  Will initiate symptomatic treatment, give the  patient intravenous fluids as she does have dry mucous membranes, and get a CT scan of the abdomen for further evaluation.  Plan reevaluation for final disposition.  ----------------------------------------- 8:19 PM on 12/08/2017 -----------------------------------------  Patient continues to be hemodynamically stable.  She states that the fentanyl did not help her pain and she does have a history of chronic pain.  I have given her a small dose of Dilaudid.  Her laboratory studies are concerning for dehydration with an acute renal insufficiency and some hyponatremia, although she has had hyponatremia in the past.  She has been given 2 L of fluid and we will recheck her kidney function as well as sodium.  The patient has not had any diarrhea since she has been here, but her brother is now present at the bedside.  He states she has had more than 3 episodes and has been having a lot of diarrhea since 3:30 AM.  He gave the patient Imodium but she continued to have loose stool.  Otherwise, her history from today is grossly unchanged according to her brother.  ----------------------------------------- 9:35 PM on 12/08/2017 -----------------------------------------  The patient continues to be hemodynamically stable she does have a CT scan which shows some inflammation around the bladder and her UA is consistent with UTI; I have ordered a urine culture and Rocephin for her.  She has not had any diarrhea here for stool sample yet.  Her CT scan also shows a mass on the ventral aspect of the heart; I have obtained an EKG which has an abnormal rhythm.  Her troponin is pending.  At this time, the patient will be admitted to the hospital for hydration, further evaluation and treatment.  I have spoken to the patient and her family about the results of her testing and the plan for today.  ____________________________________________  FINAL CLINICAL IMPRESSION(S) / ED DIAGNOSES  Final diagnoses:  Acute renal  insufficiency  Diarrhea, unspecified type  Acute cystitis without hematuria  Cardiac mass         NEW MEDICATIONS STARTED DURING THIS VISIT:  New Prescriptions   No medications on file       Eula Listen, MD 12/08/17 2022    Eula Listen, MD 12/08/17 2137    Eula Listen, MD 12/08/17 2359

## 2017-12-08 NOTE — ED Notes (Signed)
Mask applied to pt since in hall

## 2017-12-08 NOTE — H&P (Addendum)
[facility] NAME: Cindy Sullivan    MR#:  [id number]  DATE OF BIRTH:  03-Dec-1928  DATE OF ADMISSION:  12/08/2017  PRIMARY CARE PHYSICIAN: Glendon Axe, MD   REQUESTING/REFERRING PHYSICIAN:   CHIEF COMPLAINT:   Chief Complaint  Patient presents with  . Diarrhea    HISTORY OF PRESENT ILLNESS: Cindy Sullivan  is a 84 y.o. female with a known history of recurrent UTIs and dementia. Patient was brought to emergency room for acute onset of diarrhea, with diffuse abdominal cramping and altered mental status, started suddenly 24 hours before coming to the hospital.  Patient was started on amoxicillin 3 days ago for a urinary tract infection by the primary care physician.  She is not able to provide any history due to her altered mental status and her dementia.  Information was taken from reviewing the medical records and from discussion with her family. Per patient's brother, she did not have any fever or chills at home; no nausea/ vomiting. Blood test done in the emergency room reveal elevated WBC at 16,000 and low sodium level at 127.  Creatinine is elevated at 1.69.  Troponin is minimally elevated at 0.06.  UA is positive for UTI, again.  The CAT scan of the abdomen done without contrast reveals as incidental finding a cardiac mass, but otherwise unremarkable for acute findings in the colon. Patient is admitted for further evaluation and treatment.  PAST MEDICAL HISTORY:   Past Medical History:  Diagnosis Date  . Chest pain syndrome   . Chronic back pain   . Chronic diarrhea   . GERD (gastroesophageal reflux disease)   . H/O: GI bleed   . Hyperlipidemia   . Hypertension   . IDA (iron deficiency anemia)   . Insomnia   . Non-insulin dependent type 2 diabetes mellitus ([place])   . Pancreatitis     PAST SURGICAL HISTORY:  Past Surgical History:  Procedure Laterality Date  . APPENDECTOMY    . CHOLECYSTECTOMY    . EXPLORATORY  LAPAROTOMY    . HERNIA REPAIR     x 2  . KNEE ARTHROSCOPY Left   . TOTAL ABDOMINAL HYSTERECTOMY W/ BILATERAL SALPINGOOPHORECTOMY     one ovary still left    SOCIAL HISTORY:  Social History   Tobacco Use  . Smoking status: Never Smoker  . Smokeless tobacco: Never Used  Substance Use Topics  . Alcohol use: No    Alcohol/week: 0.0 oz    FAMILY HISTORY:  Family History  Problem Relation Age of Onset  . Prostate cancer Neg Hx   . Bladder Cancer Neg Hx   . Kidney cancer Neg Hx     DRUG ALLERGIES:  Allergies  Allergen Reactions  . Ciprofloxacin Hives  . Codeine Other (See Comments)    Other reaction(s): Headache Reaction:  Headaches   . Escitalopram Oxalate     Other reaction(s): Other (See Comments) Agitation  . Metronidazole Nausea Only  . Other     Other reaction(s): Unknown anti- Inflammatory and bee stings  . Pantoprazole Nausea Only  . Phenergan [Promethazine Hcl] Other (See Comments)    Reaction:  Unknown   . Sulfamethoxazole-Trimethoprim     Other reaction(s): Unknown    REVIEW OF SYSTEMS:   Not able to obtain as patient is confused.  MEDICATIONS AT HOME:  Prior to Admission medications   Medication Sig Start Date End Date Taking? Authorizing Provider  acetaminophen (TYLENOL) 500 MG tablet Take  500 mg by mouth 2 (two) times daily.    Yes [provider]  amoxicillin-clavulanate (AUGMENTIN) 875-125 MG tablet Take 1 tablet by mouth 2 (two) times daily for 7 days. 12/06/17 12/13/17 Yes McGowan, Larene Beach A, PA-C  atorvastatin (LIPITOR) 20 MG tablet Take 20 mg by mouth at bedtime.    Yes [provider]  insulin aspart protamine- aspart (NOVOLOG MIX 70/30) (70-30) 100 UNIT/ML injection Inject 0.12 mLs (12 Units total) into the skin daily with breakfast. 09/21/17  Yes Fritzi Mandes, MD  insulin regular (NOVOLIN R,HUMULIN R) 100 units/mL injection Inject 2-10 Units into the skin as needed for high blood sugar. Pt uses as needed per sliding scale:     151-200:  2 units  201-250:  4 units 251-300:  6 units  301-350:  8 units 351-400:  10 units   Yes [provider]  loperamide (IMODIUM) 2 MG capsule Take 2 mg by mouth as needed for diarrhea or loose stools.   Yes [provider]  memantine (NAMENDA) 5 MG tablet Take 5 mg by mouth 2 (two) times daily.    Yes [provider]  omeprazole (PRILOSEC) 40 MG capsule Take 40 mg by mouth daily.    Yes [provider]  oxybutynin (DITROPAN-XL) 5 MG 24 hr tablet Take 5 mg by mouth 2 (two) times daily.    Yes [provider]  psyllium (HYDROCIL/METAMUCIL) 95 % PACK Take 1 packet by mouth daily.   Yes [provider]  tiZANidine (ZANAFLEX) 2 MG tablet Take 1-2 tablets by mouth 3 (three) times daily as needed. 12/05/17  Yes [provider]  traMADol (ULTRAM) 50 MG tablet Take 1 tablet (50 mg total) by mouth every 6 (six) hours as needed for moderate pain. 04/16/16  Yes Dustin Flock, MD  conjugated estrogens (PREMARIN) vaginal cream Place 1 Applicatorful vaginally daily. Use pea sized amount M-W-Fr before bedtime Patient not taking: Reported on 11/04/2017 08/24/17   Hollice Espy, MD  polyethylene glycol [facility] / Floria Raveling) packet Take 17 g by mouth daily as needed for mild constipation. Patient not taking: Reported on 11/04/2017 09/20/17   Fritzi Mandes, MD      PHYSICAL EXAMINATION:   VITAL SIGNS: Blood pressure 135/74, pulse 74, temperature 98.4 F (36.9 C), temperature source Oral, resp. rate 18, weight 46.3 kg (102 lb), SpO2 92 %.  GENERAL:  84 y.o.-year-old patient lying in the bed, in moderate distress, secondary to abdominal pain.  She looks dry, acutely ill, but nontoxic. EYES: No scleral icterus.  HEENT: Head atraumatic, normocephalic.  Dry mucous membranes. NECK:  Supple, no jugular venous distention. No thyroid enlargement, no tenderness.  LUNGS: Reduced breath sounds bilaterally, no wheezing. No use of accessory muscles of  respiration.  CARDIOVASCULAR: S1, S2 normal. No S3/S4.  ABDOMEN: Soft, nontender, nondistended. Bowel sounds present. No organomegaly or mass.  EXTREMITIES: No pedal edema, cyanosis, or clubbing.  NEUROLOGIC: No focal weakness. Gait not checked, as patient is too weak to ambulate.  PSYCHIATRIC: The patient is alert, but lethargic disoriented.  SKIN: No obvious rash, lesion, or ulcer.   LABORATORY PANEL:   CBC Recent Labs  Lab 12/08/17 1825  WBC 16.0*  HGB 10.8*  HCT 33.3*  PLT 398  MCV 84.2  MCH 27.3  MCHC 32.5  RDW 15.2*   ------------------------------------------------------------------------------------------------------------------  Chemistries  Recent Labs  Lab 12/08/17 1825  NA 127*  K 4.9  CL 97*  CO2 21*  GLUCOSE 142*  BUN 43*  CREATININE 1.69*  CALCIUM 8.3*  AST 25  ALT 13*  ALKPHOS 77  BILITOT 0.4   ------------------------------------------------------------------------------------------------------------------ estimated creatinine clearance is 14.6 mL/min (A) (by C-G formula based on SCr of 1.69 mg/dL (H)). ------------------------------------------------------------------------------------------------------------------ No results for input(s): TSH, T4TOTAL, T3FREE, THYROIDAB in the last 72 hours.  Invalid input(s): FREET3   Coagulation profile No results for input(s): INR, PROTIME in the last 168 hours. ------------------------------------------------------------------------------------------------------------------- No results for input(s): DDIMER in the last 72 hours. -------------------------------------------------------------------------------------------------------------------  Cardiac Enzymes Recent Labs  Lab 12/08/17 1831  TROPONINI 0.06*   ------------------------------------------------------------------------------------------------------------------ Invalid input(s):  POCBNP  ---------------------------------------------------------------------------------------------------------------  Urinalysis    Component Value Date/Time   COLORURINE AMBER (A) 12/08/2017 2039   APPEARANCEUR CLOUDY (A) 12/08/2017 2039   APPEARANCEUR Cloudy (A) 12/06/2017 1209   LABSPEC 1.017 12/08/2017 2039   LABSPEC 1.013 05/17/2014 1947   PHURINE 5.0 12/08/2017 2039   GLUCOSEU NEGATIVE 12/08/2017 2039   GLUCOSEU Negative 05/17/2014 1947   HGBUR LARGE (A) 12/08/2017 2039   BILIRUBINUR NEGATIVE 12/08/2017 2039   BILIRUBINUR Negative 12/06/2017 1209   BILIRUBINUR Negative 05/17/2014 1947   KETONESUR 5 (A) 12/08/2017 2039   PROTEINUR 30 (A) 12/08/2017 2039   NITRITE NEGATIVE 12/08/2017 2039   LEUKOCYTESUR SMALL (A) 12/08/2017 2039   LEUKOCYTESUR 2+ (A) 12/06/2017 1209   LEUKOCYTESUR 3+ 05/17/2014 1947     RADIOLOGY: Ct Abdomen Pelvis Wo Contrast  Result Date: 12/08/2017 CLINICAL DATA:  Diarrhea earlier this morning. EXAM: CT ABDOMEN AND PELVIS WITHOUT CONTRAST TECHNIQUE: Multidetector CT imaging of the abdomen and pelvis was performed following the standard protocol without IV contrast. COMPARISON:  CT abdomen and pelvis from 04/30/2017 FINDINGS: Lower chest: Partially imaged 3.7 x 4.8 cm masslike abnormality along the ventral aspect of the heart, epicenter of the mass along the pericardium. Dedicated CT of the chest for further evaluation is recommended. This does not have the typical appearance of a pericardial cyst or lipoma. Partially included lymphoma or possibly mesothelioma among some other solid pathology that may predispose to this area though not exclusive. This is not apparent on the comparison exam. Heart is borderline enlarged with left main and 3 vessel coronary arteriosclerosis. There is aortic atherosclerosis. Hepatobiliary: The unopacified liver demonstrates intrahepatic and extrahepatic ductal dilatation (to 21 mm currently) which can be seen in the prior setting  of cholecystectomy. No choledocholithiasis. The patient is status post cholecystectomy. Calcified granuloma is noted in the right hepatic lobe. Pancreas: Atrophic without focal mass or ductal dilatation. Spleen: Normal Adrenals/Urinary Tract: Normal bilateral adrenal glands. No nephrolithiasis or hydronephrosis. Water attenuating 2.5 cm in diameter cyst is seen in the lower pole the left kidney unchanged in appearance. Smaller cysts are also noted of the left kidney as before. No hydroureteronephrosis. Urinary bladder is physiologically distended without calculus. Slight nodular thickening along the dependent and nondependent walls of the urinary bladder are seen. Direct visual correlation is recommended for further evaluation to exclude a flat transitional cell lesion versus focal inflammatory thickening. Stomach/Bowel: Nondistended stomach with normal small bowel rotation. Scattered colonic diverticulosis without acute diverticulitis. No bowel obstruction. Status post appendectomy by report Vascular/Lymphatic: Moderate aortoiliac atherosclerosis without aneurysm. No lymphadenopathy. Reproductive: Uterus is surgically absent. No adnexal mass is noted. Other: No free air nor free fluid. Musculoskeletal: Mild degenerative disc disease of the thoracolumbar juncture. No acute nor suspicious osseous lesions. IMPRESSION: 1. A partially included, 3.7 x 4.8 cm solid appearing masslike abnormality is noted along the ventral aspect of the heart along the pericardium. This does not have the typical appearance of  a more common epicardial cyst or lipoma. Dedicated CT with IV contrast is recommended for further correlation. More common solid pathology in this region may include a mesothelioma or partial imaging of lymphoma among some possibilities. 2. Left main and three-vessel coronary arteriosclerosis. 3. Chronic dilatation of the intrahepatic and extrahepatic biliary system status post cholecystectomy. Choledocholithiasis. 4.  Water attenuating cyst of the left lower pole of the kidney measuring 2.5 cm. 5. Eccentric mural thickening of the bladder along the dependent posterior wall. Direct visual correlation is recommended to exclude a transitional cell lesion versus inflammatory thickening or dependent debris. 6. Moderate aortoiliac atherosclerosis. 7. Colonic diverticulosis without acute diverticulitis. Electronically Signed   By: Ashley Royalty M.D.   On: 12/08/2017 20:26    EKG: Orders placed or performed during the hospital encounter of 12/08/17  . ED EKG  . ED EKG  . EKG 12-Lead  . EKG 12-Lead  . ED EKG  . ED EKG    IMPRESSION AND PLAN:   1.  Acute colitis.  There is high suspicious for C. difficile colitis.  We will start treatment with Flagyl, stool test is pending.  Will start probiotics as well.  Continue supportive measures with IV hydration and pain control. 2.  Acute recurrent UTI.  Most recent urine culture from 2 days ago seems to be positive for gram-negative rods.  We will start treatment with ceftriaxone IV. 3.  Acute renal failure, likely prerenal secondary to dehydration.  We will start IV hydration and monitor kidney function closely.  Avoid nephrotoxic medication.  4.  Hyponatremia, renal level is 127, likely secondary to hypovolemia.  We will start IV fluids with normal saline and monitor sodium level closely. 5.  Acute metabolic encephalopathy, likely multifactorial secondary to ARF, infectious process, hyponatremia and side effects from pain medications.  We will continue to monitor clinically closely as we treat the underlying disease. 6.  Mild elevation in troponin level, at 0.06, likely related to demand ischemia, due to infectious process.  EKG is reviewed by myself and noted without any acute changes.  We will continue telemetry monitor and follow troponin levels. 7.  Cardiac mass, incidental finding on abdominal CAT scan.  This would have to be further evaluated with a dedicated CT with IV  contrast, in the near future, after the kidney function would normalize.  8. Vaginal bleeding, one episode noted now, while in the hospital. Will check pelvic US.  All the records are reviewed and case discussed with ED provider. Management plans discussed with the patient, family and they are in agreement.  CODE STATUS: Code Status History    Date Active Date Inactive Code Status Order ID Comments User Context   09/18/2017 00:54 09/20/2017 15:13 DNR [id number]  Gorden Harms, MD Inpatient   06/27/2017 20:10 06/29/2017 21:18 Full Code [id number]  Epifanio Lesches, MD ED   05/01/2017 01:20 05/02/2017 14:46 Full Code [id number]  Harvie Bridge, DO Inpatient   04/13/2016 21:15 04/17/2016 18:30 Full Code [id number]  Henreitta Leber, MD Inpatient    Questions for Most Recent Historical Code Status (Order [id number])    Question Answer Comment   In the event of cardiac or respiratory ARREST Do not call a "code blue"    In the event of cardiac or respiratory ARREST Do not perform Intubation, CPR, defibrillation or ACLS    In the event of cardiac or respiratory ARREST Use medication by any route, position, wound care, and other measures to relive pain and suffering. May use  oxygen, suction and manual treatment of airway obstruction as needed for comfort.        TOTAL TIME TAKING CARE OF THIS PATIENT: 40 minutes.    Amelia Jo M.D on 12/08/2017 at 11:29 PM  Between 7am to 6pm - Pager - [phone]  After 6pm go to [email or web address] - password [email or web address] [facility]  [facility]  Office  [phone]  CC: Primary care physician; Glendon Axe, MD

## 2017-12-09 ENCOUNTER — Inpatient Hospital Stay: Payer: Medicare Other

## 2017-12-09 ENCOUNTER — Other Ambulatory Visit: Payer: Self-pay

## 2017-12-09 ENCOUNTER — Telehealth: Payer: Self-pay

## 2017-12-09 LAB — CBC
HCT: 35.5 % (ref 35.0–47.0)
Hemoglobin: 11.6 g/dL — ABNORMAL LOW (ref 12.0–16.0)
MCH: 27.8 pg (ref 26.0–34.0)
MCHC: 32.6 g/dL (ref 32.0–36.0)
MCV: 85.3 fL (ref 80.0–100.0)
Platelets: 372 10*3/uL (ref 150–440)
RBC: 4.16 MIL/uL (ref 3.80–5.20)
RDW: 15 % — AB (ref 11.5–14.5)
WBC: 13.6 10*3/uL — AB (ref 3.6–11.0)

## 2017-12-09 LAB — CULTURE, URINE COMPREHENSIVE

## 2017-12-09 LAB — BASIC METABOLIC PANEL
ANION GAP: 10 (ref 5–15)
Anion gap: 9 (ref 5–15)
BUN: 32 mg/dL — AB (ref 6–20)
BUN: 37 mg/dL — ABNORMAL HIGH (ref 6–20)
CALCIUM: 8.2 mg/dL — AB (ref 8.9–10.3)
CO2: 19 mmol/L — ABNORMAL LOW (ref 22–32)
CO2: 17 mmol/L — ABNORMAL LOW (ref 22–32)
CREATININE: 1.35 mg/dL — AB (ref 0.44–1.00)
Calcium: 7.7 mg/dL — ABNORMAL LOW (ref 8.9–10.3)
Chloride: 104 mmol/L (ref 101–111)
Chloride: 105 mmol/L (ref 101–111)
Creatinine, Ser: 1.23 mg/dL — ABNORMAL HIGH (ref 0.44–1.00)
GFR calc Af Amer: 45 mL/min — ABNORMAL LOW (ref >60)
GFR, EST AFRICAN AMERICAN: 40 mL/min — AB (ref >60)
GFR, EST NON AFRICAN AMERICAN: 39 mL/min — AB (ref >60)
GFR, EST NON AFRICAN AMERICAN: 34 mL/min — AB (ref >60)
GLUCOSE: 100 mg/dL — AB (ref 65–99)
Glucose, Bld: 106 mg/dL — ABNORMAL HIGH (ref 65–99)
Potassium: 4.7 mmol/L (ref 3.5–5.1)
Potassium: 4.9 mmol/L (ref 3.5–5.1)
SODIUM: 132 mmol/L — AB (ref 135–145)
Sodium: 132 mmol/L — ABNORMAL LOW (ref 135–145)

## 2017-12-09 LAB — GLUCOSE, CAPILLARY
Glucose-Capillary: 87 mg/dL (ref 65–99)
Glucose-Capillary: 86 mg/dL (ref 65–99)
Glucose-Capillary: 118 mg/dL — ABNORMAL HIGH (ref 65–99)

## 2017-12-09 LAB — TROPONIN I: TROPONIN I: 0.08 ng/mL — AB (ref <0.03)

## 2017-12-09 MED ORDER — SACCHAROMYCES BOULARDII 250 MG PO CAPS
250.0000 mg | ORAL_CAPSULE | Freq: Two times a day (BID) | ORAL | Status: DC
  Administered 2017-12-09 – 2017-12-13 (×8): 250 mg via ORAL
  Filled 2017-12-09 (×10): qty 1

## 2017-12-09 MED ORDER — METRONIDAZOLE 500 MG PO TABS
500.0000 mg | ORAL_TABLET | Freq: Three times a day (TID) | ORAL | Status: DC
  Administered 2017-12-09 – 2017-12-10 (×4): 500 mg via ORAL
  Filled 2017-12-09 (×5): qty 1

## 2017-12-09 MED ORDER — TRAZODONE HCL 50 MG PO TABS
25.0000 mg | ORAL_TABLET | Freq: Every evening | ORAL | Status: DC | PRN
  Administered 2017-12-09 – 2017-12-12 (×3): 25 mg via ORAL
  Filled 2017-12-09 (×3): qty 1

## 2017-12-09 MED ORDER — SODIUM CHLORIDE 0.9 % IV SOLN
INTRAVENOUS | Status: DC
  Administered 2017-12-09 – 2017-12-11 (×4): via INTRAVENOUS

## 2017-12-09 MED ORDER — AMOXICILLIN-POT CLAVULANATE 500-125 MG PO TABS
1.0000 | ORAL_TABLET | Freq: Two times a day (BID) | ORAL | Status: DC
  Administered 2017-12-09 – 2017-12-13 (×9): 500 mg via ORAL
  Filled 2017-12-09 (×10): qty 1

## 2017-12-09 MED ORDER — BISACODYL 5 MG PO TBEC: 5.0000 mg | DELAYED_RELEASE_TABLET | Freq: Every day | ORAL | Status: DC | PRN

## 2017-12-09 MED ORDER — MEMANTINE HCL 5 MG PO TABS
5.0000 mg | ORAL_TABLET | Freq: Two times a day (BID) | ORAL | Status: DC
  Administered 2017-12-09 – 2017-12-13 (×8): 5 mg via ORAL
  Filled 2017-12-09 (×10): qty 1

## 2017-12-09 MED ORDER — PANTOPRAZOLE SODIUM 40 MG PO TBEC
40.0000 mg | DELAYED_RELEASE_TABLET | Freq: Every day | ORAL | Status: DC
  Administered 2017-12-09 – 2017-12-13 (×4): 40 mg via ORAL
  Filled 2017-12-09 (×5): qty 1

## 2017-12-09 MED ORDER — DOCUSATE SODIUM 100 MG PO CAPS
100.0000 mg | ORAL_CAPSULE | Freq: Two times a day (BID) | ORAL | Status: DC
  Administered 2017-12-09 (×2): 100 mg via ORAL
  Filled 2017-12-09 (×2): qty 1

## 2017-12-09 MED ORDER — HYDROMORPHONE HCL 1 MG/ML IJ SOLN
0.5000 mg | INTRAMUSCULAR | Status: DC | PRN
  Administered 2017-12-09 – 2017-12-13 (×18): 0.5 mg via INTRAVENOUS
  Filled 2017-12-09 (×18): qty 1

## 2017-12-09 MED ORDER — ATORVASTATIN CALCIUM 20 MG PO TABS
20.0000 mg | ORAL_TABLET | Freq: Every day | ORAL | Status: DC
  Administered 2017-12-09 – 2017-12-12 (×4): 20 mg via ORAL
  Filled 2017-12-09 (×4): qty 1

## 2017-12-09 MED ORDER — ONDANSETRON HCL 4 MG PO TABS: 4.0000 mg | ORAL_TABLET | Freq: Four times a day (QID) | ORAL | Status: DC | PRN

## 2017-12-09 MED ORDER — HEPARIN SODIUM (PORCINE) 5000 UNIT/ML IJ SOLN
5000.0000 [IU] | Freq: Three times a day (TID) | INTRAMUSCULAR | Status: DC
  Administered 2017-12-09 – 2017-12-11 (×8): 5000 [IU] via SUBCUTANEOUS
  Filled 2017-12-09 (×8): qty 1

## 2017-12-09 MED ORDER — ONDANSETRON HCL 4 MG/2ML IJ SOLN
4.0000 mg | Freq: Four times a day (QID) | INTRAMUSCULAR | Status: DC | PRN
  Administered 2017-12-10: 4 mg via INTRAVENOUS
  Filled 2017-12-09: qty 2

## 2017-12-09 MED ORDER — ACETAMINOPHEN 325 MG PO TABS
650.0000 mg | ORAL_TABLET | Freq: Four times a day (QID) | ORAL | Status: DC | PRN
  Administered 2017-12-10: 650 mg via ORAL
  Filled 2017-12-09: qty 2

## 2017-12-09 MED ORDER — ACETAMINOPHEN 650 MG RE SUPP: 650.0000 mg | Freq: Four times a day (QID) | RECTAL | Status: DC | PRN

## 2017-12-09 MED ORDER — INSULIN ASPART 100 UNIT/ML ~~LOC~~ SOLN
0.0000 [IU] | Freq: Three times a day (TID) | SUBCUTANEOUS | Status: DC
  Administered 2017-12-10: 1 [IU] via SUBCUTANEOUS
  Administered 2017-12-10: 2 [IU] via SUBCUTANEOUS
  Administered 2017-12-11: 1 [IU] via SUBCUTANEOUS
  Filled 2017-12-09 (×4): qty 1

## 2017-12-09 MED ORDER — HYDROCODONE-ACETAMINOPHEN 5-325 MG PO TABS: 1.0000 | ORAL_TABLET | ORAL | Status: DC | PRN

## 2017-12-09 MED ORDER — SODIUM CHLORIDE 0.9 % IV SOLN
1.0000 g | INTRAVENOUS | Status: DC
  Administered 2017-12-09: 1 g via INTRAVENOUS
  Filled 2017-12-09: qty 10

## 2017-12-09 MED ORDER — TRAMADOL HCL 50 MG PO TABS
50.0000 mg | ORAL_TABLET | Freq: Two times a day (BID) | ORAL | Status: DC | PRN
  Administered 2017-12-09 – 2017-12-13 (×5): 50 mg via ORAL
  Filled 2017-12-09 (×6): qty 1

## 2017-12-09 NOTE — Progress Notes (Signed)
[facility] [place] NAME: Cindy Sullivan    MR#:  [id number]  DATE OF BIRTH:  Aug 01, 1929  SUBJECTIVE:  CHIEF COMPLAINT:   Chief Complaint  Patient presents with  . Diarrhea   The patient is demented, she complains sickness. REVIEW OF SYSTEMS:  Review of Systems  Unable to perform ROS: Dementia    DRUG ALLERGIES:   Allergies  Allergen Reactions  . Ciprofloxacin Hives  . Codeine Other (See Comments)    Other reaction(s): Headache Reaction:  Headaches   . Escitalopram Oxalate     Other reaction(s): Other (See Comments) Agitation  . Metronidazole Nausea Only  . Other     Other reaction(s): Unknown anti- Inflammatory and bee stings  . Pantoprazole Nausea Only  . Phenergan [Promethazine Hcl] Other (See Comments)    Reaction:  Unknown   . Sulfamethoxazole-Trimethoprim     Other reaction(s): Unknown   VITALS:  Blood pressure (!) 135/44, pulse 69, temperature 98.9 F (37.2 C), temperature source Oral, resp. rate 18, height 4\' 9"  (1.448 m), weight 103 lb (46.7 kg), SpO2 100 %. PHYSICAL EXAMINATION:  Physical Exam  Constitutional:  Malnutrition.  HENT:  Head: Normocephalic.  Mouth/Throat: Oropharynx is clear and moist.  Eyes: Conjunctivae and EOM are normal. Pupils are equal, round, and reactive to light. No scleral icterus.  Neck: Normal range of motion. Neck supple. No JVD present. No tracheal deviation present.  Cardiovascular: Normal rate, regular rhythm and normal heart sounds. Exam reveals no gallop.  No murmur heard. Pulmonary/Chest: Effort normal and breath sounds normal. No respiratory distress. She has no wheezes. She has no rales.  Abdominal: Soft. Bowel sounds are normal. She exhibits no distension. There is no tenderness. There is no rebound.  Musculoskeletal: She exhibits no edema or tenderness.  Neurological: No cranial nerve deficit.  Unable to exam.  Skin: No rash noted. No erythema.  Psychiatric:    Demented.   LABORATORY PANEL:  Female CBC Recent Labs  Lab 12/09/17 1029  WBC 13.6*  HGB 11.6*  HCT 35.5  PLT 372   ------------------------------------------------------------------------------------------------------------------ Chemistries  Recent Labs  Lab 12/08/17 1825  12/09/17 1029  NA 127*   < > 132*  K 4.9   < > 4.9  CL 97*   < > 105  CO2 21*   < > 17*  GLUCOSE 142*   < > 100*  BUN 43*   < > 32*  CREATININE 1.69*   < > 1.23*  CALCIUM 8.3*   < > 8.2*  AST 25  --   --   ALT 13*  --   --   ALKPHOS 77  --   --   BILITOT 0.4  --   --    < > = values in this interval not displayed.   RADIOLOGY:  Ct Abdomen Pelvis Wo Contrast  Result Date: 12/08/2017 CLINICAL DATA:  Diarrhea earlier this morning. EXAM: CT ABDOMEN AND PELVIS WITHOUT CONTRAST TECHNIQUE: Multidetector CT imaging of the abdomen and pelvis was performed following the standard protocol without IV contrast. COMPARISON:  CT abdomen and pelvis from 04/30/2017 FINDINGS: Lower chest: Partially imaged 3.7 x 4.8 cm masslike abnormality along the ventral aspect of the heart, epicenter of the mass along the pericardium. Dedicated CT of the chest for further evaluation is recommended. This does not have the typical appearance of a pericardial cyst or lipoma. Partially included lymphoma or possibly mesothelioma among some other solid pathology that may predispose to  this area though not exclusive. This is not apparent on the comparison exam. Heart is borderline enlarged with left main and 3 vessel coronary arteriosclerosis. There is aortic atherosclerosis. Hepatobiliary: The unopacified liver demonstrates intrahepatic and extrahepatic ductal dilatation (to 21 mm currently) which can be seen in the prior setting of cholecystectomy. No choledocholithiasis. The patient is status post cholecystectomy. Calcified granuloma is noted in the right hepatic lobe. Pancreas: Atrophic without focal mass or ductal dilatation. Spleen: Normal  Adrenals/Urinary Tract: Normal bilateral adrenal glands. No nephrolithiasis or hydronephrosis. Water attenuating 2.5 cm in diameter cyst is seen in the lower pole the left kidney unchanged in appearance. Smaller cysts are also noted of the left kidney as before. No hydroureteronephrosis. Urinary bladder is physiologically distended without calculus. Slight nodular thickening along the dependent and nondependent walls of the urinary bladder are seen. Direct visual correlation is recommended for further evaluation to exclude a flat transitional cell lesion versus focal inflammatory thickening. Stomach/Bowel: Nondistended stomach with normal small bowel rotation. Scattered colonic diverticulosis without acute diverticulitis. No bowel obstruction. Status post appendectomy by report Vascular/Lymphatic: Moderate aortoiliac atherosclerosis without aneurysm. No lymphadenopathy. Reproductive: Uterus is surgically absent. No adnexal mass is noted. Other: No free air nor free fluid. Musculoskeletal: Mild degenerative disc disease of the thoracolumbar juncture. No acute nor suspicious osseous lesions. IMPRESSION: 1. A partially included, 3.7 x 4.8 cm solid appearing masslike abnormality is noted along the ventral aspect of the heart along the pericardium. This does not have the typical appearance of a more common epicardial cyst or lipoma. Dedicated CT with IV contrast is recommended for further correlation. More common solid pathology in this region may include a mesothelioma or partial imaging of lymphoma among some possibilities. 2. Left main and three-vessel coronary arteriosclerosis. 3. Chronic dilatation of the intrahepatic and extrahepatic biliary system status post cholecystectomy. Choledocholithiasis. 4. Water attenuating cyst of the left lower pole of the kidney measuring 2.5 cm. 5. Eccentric mural thickening of the bladder along the dependent posterior wall. Direct visual correlation is recommended to exclude a  transitional cell lesion versus inflammatory thickening or dependent debris. 6. Moderate aortoiliac atherosclerosis. 7. Colonic diverticulosis without acute diverticulitis. Electronically Signed   By: Ashley Royalty M.D.   On: 12/08/2017 20:26   US Pelvis (transabdominal Only)  Result Date: 12/09/2017 CLINICAL DATA:  Vaginal bleeding. EXAM: TRANSABDOMINAL ULTRASOUND OF PELVIS TECHNIQUE: Transabdominal ultrasound examination of the pelvis was performed including evaluation of the uterus, ovaries, adnexal regions, and pelvic cul-de-sac. COMPARISON:  Abdominal CT from yesterday. FINDINGS: Uterus Surgically absent.  Unremarkable cuff. Right ovary Not visualized.  Negative adnexa on preceding abdominal CT. Left ovary Not visualized. Negative adnexa on preceding abdominal CT. Other findings: There is posterior/inferior wall lobulated mural thickening concerning for a mass. This was also noted on previous CT. No visible extension of the mass into the vaginal cuff. IMPRESSION: 1. Hysterectomy with negative vaginal cuff. 2. Neither ovary could be visualized. The adnexa are negative on preceding abdominal CT. 3. Masslike thickening at the bladder base as noted on CT yesterday. Consider cystoscopy. Electronically Signed   By: Monte Fantasia M.D.   On: 12/09/2017 10:13   ASSESSMENT AND PLAN:   1.  Acute colitis with leukocytosis.   Follow-up C. difficile test.   Continue Flagyl and probiotics. Continue supportive measures with IV hydration and pain control.  Follow-up CBC.  2.  Acute recurrent UTI.  Most recent urine culture from 2 days ago seems to be positive for gram-negative rods.  Not sensitive  to ceftriaxone IV.  Changed to Augmentin.  3.  Acute renal failure, likely prerenal secondary to dehydration.   Continue IV hydration and monitor kidney function closely.  Avoid nephrotoxic medication.   4.  Hyponatremia, likely secondary to hypovolemia.   Improving with IV normal saline and monitor sodium  level.  5.  Acute metabolic encephalopathy, likely multifactorial secondary to ARF, infectious process, hyponatremia and side effects from pain medications.   continue to monitor clinically closely as we treat the underlying disease.  6.  Mild elevation in troponin level, at 0.06, likely related to demand ischemia, due to above.    7.  Cardiac mass, incidental finding on abdominal CAT scan.  This would have to be further evaluated with a dedicated CT with IV contrast, in the near future, after the kidney function would normalize.  8. Vaginal bleeding, one episode noted yesterday. check pelvic US: Masslike thickening at the bladder base as noted on CT yesterday. Consider cystoscopy.  May need follow-up OB/GYN and urologist as outpatient.  All the records are reviewed and case discussed with Care Management/Social Worker. Management plans discussed with the patient, family and they are in agreement.  CODE STATUS: DNR  TOTAL TIME TAKING CARE OF THIS PATIENT: 37 minutes.   More than 50% of the time was spent in counseling/coordination of care: YES  POSSIBLE D/C IN 2 DAYS, DEPENDING ON CLINICAL CONDITION.   Demetrios Loll M.D on 12/09/2017 at 3:45 PM  Between 7am to 6pm - Pager - [phone]  After 6pm go to [email or web address] - [place] [facility]

## 2017-12-09 NOTE — Evaluation (Signed)
Physical Therapy Evaluation Patient Details Name: Cindy Sullivan MRN: [id number] DOB: June 29, 1929 Today's Date: 12/09/2017   History of Present Illness  Pt admitted for acute colitis. History includes multiple UTI, dementia, GERD, HTN, and DM. Pt with complaints of diarrhea and cramps. Pt reports she is in contant pain "everywhere".  Clinical Impression  Pt is a pleasantly confused 84 year old female who was admitted for acute colitis. Pt performs bed mobility, transfers, and ambulation with min assist and RW. Unable to further ambulate this date. Pt has multiple incontinent episodes every time she stands up. Pt demonstrates deficits with strength/mobility/endurance/balance. Pt is currently not at baseline level. Would benefit from skilled PT to address above deficits and promote optimal return to PLOF; recommend transition to STR upon discharge from acute hospitalization.       Follow Up Recommendations SNF    Equipment Recommendations  None recommended by PT    Recommendations for Other Services       Precautions / Restrictions Precautions Precautions: Fall Restrictions Weight Bearing Restrictions: No      Mobility  Bed Mobility Overal bed mobility: Needs Assistance Bed Mobility: Supine to Sit     Supine to sit: Min assist     General bed mobility comments: Windswept in bed on B LE towards R. During transfers to EOB, needs assist for trunkal elevation. Once seated at EOB, has forward flexed posture.   Transfers Overall transfer level: Needs assistance Equipment used: Rolling walker (2 wheeled) Transfers: Sit to/from Stand Sit to Stand: Min assist         General transfer comment: needs assist for anterior translation of weight. Once standing, forward flexed noted and incontinent episode noted. Multiple transfers to perform hygiene.  Ambulation/Gait Ambulation/Gait assistance: Min assist Ambulation Distance (Feet): 3 Feet Assistive device: Rolling walker (2  wheeled) Gait Pattern/deviations: Step-to pattern     General Gait Details: shuffle gait pattern to recliner. RW used for cues for sequencing. Pt slightly agitated at incontient episode, further ambulation deferred.  Stairs            Wheelchair Mobility    Modified Rankin (Stroke Patients Only)       Balance Overall balance assessment: Needs assistance Sitting-balance support: Feet supported Sitting balance-Leahy Scale: Good     Standing balance support: Bilateral upper extremity supported Standing balance-Leahy Scale: Fair                               Pertinent Vitals/Pain Pain Assessment: Faces Faces Pain Scale: Hurts little more Pain Location: "everywhere" Pain Descriptors / Indicators: Constant;Discomfort;Dull Pain Intervention(s): Limited activity within patient's tolerance    Home Living Family/patient expects to be discharged to:: Private residence Living Arrangements: Children Available Help at Discharge: Family;Available 24 hours/day Type of Home: House Home Access: Stairs to enter Entrance Stairs-Rails: Right;Left;Can reach both Entrance Stairs-Number of Steps: 2 Home Layout: One level Home Equipment: Walker - 2 wheels;Shower seat;Cane - single point      Prior Function Level of Independence: Needs assistance   Gait / Transfers Assistance Needed: Usually ambulates very little in home, only ambulates when PT comes to work with her. Otherwise, pretty sedentary  ADL's / Homemaking Assistance Needed: Family assists with all ADLs        Hand Dominance        Extremity/Trunk Assessment   Upper Extremity Assessment Upper Extremity Assessment: Generalized weakness(B UE grossly 4/5)    Lower Extremity Assessment Lower  Extremity Assessment: Generalized weakness(B LE grossly 3+/5)       Communication   Communication: HOH  Cognition Arousal/Alertness: Awake/alert Behavior During Therapy: WFL for tasks assessed/performed Overall  Cognitive Status: History of cognitive impairments - at baseline                                        General Comments      Exercises     Assessment/Plan    PT Assessment Patient needs continued PT services  PT Problem List Decreased strength;Decreased balance;Decreased mobility;Decreased cognition;Pain       PT Treatment Interventions Gait training;Therapeutic exercise;Balance training    PT Goals (Current goals can be found in the Care Plan section)  Acute Rehab PT Goals Patient Stated Goal: to be able to walk to the bathroom PT Goal Formulation: With patient Time For Goal Achievement: 12/23/17 Potential to Achieve Goals: Good    Frequency Min 2X/week   Barriers to discharge        Co-evaluation               AM-PAC PT "6 Clicks" Daily Activity  Outcome Measure Difficulty turning over in bed (including adjusting bedclothes, sheets and blankets)?: Unable Difficulty moving from lying on back to sitting on the side of the bed? : Unable Difficulty sitting down on and standing up from a chair with arms (e.g., wheelchair, bedside commode, etc,.)?: Unable Help needed moving to and from a bed to chair (including a wheelchair)?: A Little Help needed walking in hospital room?: A Lot Help needed climbing 3-5 steps with a railing? : Total 6 Click Score: 9    End of Session Equipment Utilized During Treatment: Gait belt Activity Tolerance: Patient tolerated treatment well Patient left: in chair;with chair alarm set;with nursing/sitter in room;with family/visitor present Nurse Communication: Mobility status PT Visit Diagnosis: Unsteadiness on feet (R26.81);Muscle weakness (generalized) (M62.81);Difficulty in walking, not elsewhere classified (R26.2);Pain Pain - Right/Left: Right Pain - part of body: Leg    Time: 8938-1017 PT Time Calculation (min) (ACUTE ONLY): 26 min   Charges:   PT Evaluation $PT Eval Low Complexity: 1 Low PT  Treatments $Therapeutic Activity: 8-22 mins   PT G Codes:        Greggory Stallion, PT, DPT [phone]   Kortne All 12/09/2017, 5:10 PM

## 2017-12-09 NOTE — Progress Notes (Addendum)
Pt admitted to room 252, A&O x3, forgetful, anxious,resless, and agitated at times. Pt has a hx of alzheimer's dementia per documentation, unable to complete admission documentation due to pt's mental status. Pt is weak, attempted to use BSC but could not stand on her own.  Pt has some vaginal bleeding with mild clots, MD Made aware.

## 2017-12-09 NOTE — Plan of Care (Signed)
Pt is alert, oriented to person only. VSS. RA . NSR on monitor. Family at bedside. IVF continued per order. Pt went for pelvic xray this shift. Pt c/o back pain, prn dilaudid given per order. Will continue to monitor and report to oncoming RN .  Progressing Education: Knowledge of General Education information will improve 12/09/2017 1407 - Progressing by Aleen Campi, RN Health Behavior/Discharge Planning: Ability to manage health-related needs will improve 12/09/2017 1407 - Progressing by Aleen Campi, RN Clinical Measurements: Will remain free from infection 12/09/2017 1407 - Progressing by Aleen Campi, RN Diagnostic test results will improve 12/09/2017 1407 - Progressing by Aleen Campi, RN Activity: Risk for activity intolerance will decrease 12/09/2017 1407 - Progressing by Aleen Campi, RN Nutrition: Adequate nutrition will be maintained 12/09/2017 1407 - Progressing by Aleen Campi, RN Coping: Level of anxiety will decrease 12/09/2017 1407 - Progressing by Aleen Campi, RN Elimination: Will not experience complications related to bowel motility 12/09/2017 1407 - Progressing by Aleen Campi, RN Will not experience complications related to urinary retention 12/09/2017 1407 - Progressing by Aleen Campi, RN Pain Managment: General experience of comfort will improve 12/09/2017 1407 - Progressing by Aleen Campi, RN Safety: Ability to remain free from injury will improve 12/09/2017 1407 - Progressing by Aleen Campi, RN Skin Integrity: Risk for impaired skin integrity will decrease 12/09/2017 1407 - Progressing by Aleen Campi, RN

## 2017-12-09 NOTE — Telephone Encounter (Signed)
-----   Message from Nori Riis, PA-C sent at 12/09/2017 11:30 AM EST ----- Patient's urine culture is positive, but she is currently being admitted to the hospital for diarrhea.  They have started her on Rocephin.

## 2017-12-10 ENCOUNTER — Encounter: Payer: Self-pay | Admitting: Radiology

## 2017-12-10 ENCOUNTER — Inpatient Hospital Stay: Payer: Medicare Other

## 2017-12-10 LAB — C DIFFICILE QUICK SCREEN W PCR REFLEX
C DIFFICILE (CDIFF) TOXIN: NEGATIVE
C DIFFICLE (CDIFF) ANTIGEN: POSITIVE — AB

## 2017-12-10 LAB — GASTROINTESTINAL PANEL BY PCR, STOOL (REPLACES STOOL CULTURE)

## 2017-12-10 LAB — COMPREHENSIVE METABOLIC PANEL
ALBUMIN: 2.2 g/dL — AB (ref 3.5–5.0)
ALT: 20 U/L (ref 14–54)
AST: 32 U/L (ref 15–41)
Alkaline Phosphatase: 90 U/L (ref 38–126)
Anion gap: 7 (ref 5–15)
BUN: 24 mg/dL — ABNORMAL HIGH (ref 6–20)
CHLORIDE: 107 mmol/L (ref 101–111)
CO2: 18 mmol/L — AB (ref 22–32)
CREATININE: 0.93 mg/dL (ref 0.44–1.00)
Calcium: 8.1 mg/dL — ABNORMAL LOW (ref 8.9–10.3)
GFR calc non Af Amer: 54 mL/min — ABNORMAL LOW (ref >60)
GLUCOSE: 152 mg/dL — AB (ref 65–99)
Potassium: 4.6 mmol/L (ref 3.5–5.1)
SODIUM: 132 mmol/L — AB (ref 135–145)
Total Bilirubin: 0.6 mg/dL (ref 0.3–1.2)
Total Protein: 6.2 g/dL — ABNORMAL LOW (ref 6.5–8.1)

## 2017-12-10 LAB — BASIC METABOLIC PANEL
ANION GAP: 8 (ref 5–15)
BUN: 24 mg/dL — ABNORMAL HIGH (ref 6–20)
CHLORIDE: 105 mmol/L (ref 101–111)
CO2: 17 mmol/L — AB (ref 22–32)
Calcium: 8.3 mg/dL — ABNORMAL LOW (ref 8.9–10.3)
Creatinine, Ser: 1.02 mg/dL — ABNORMAL HIGH (ref 0.44–1.00)
GFR calc Af Amer: 56 mL/min — ABNORMAL LOW (ref >60)
GFR calc non Af Amer: 48 mL/min — ABNORMAL LOW (ref >60)
Glucose, Bld: 179 mg/dL — ABNORMAL HIGH (ref 65–99)
POTASSIUM: 4.4 mmol/L (ref 3.5–5.1)
Sodium: 130 mmol/L — ABNORMAL LOW (ref 135–145)

## 2017-12-10 LAB — CLOSTRIDIUM DIFFICILE BY PCR, REFLEXED: Toxigenic C. Difficile by PCR: POSITIVE — AB

## 2017-12-10 LAB — GLUCOSE, CAPILLARY
GLUCOSE-CAPILLARY: 124 mg/dL — AB (ref 65–99)
GLUCOSE-CAPILLARY: 110 mg/dL — AB (ref 65–99)
Glucose-Capillary: 119 mg/dL — ABNORMAL HIGH (ref 65–99)
Glucose-Capillary: 176 mg/dL — ABNORMAL HIGH (ref 65–99)

## 2017-12-10 LAB — URINE CULTURE: Culture: NO GROWTH

## 2017-12-10 MED ORDER — VANCOMYCIN 50 MG/ML ORAL SOLUTION
125.0000 mg | Freq: Four times a day (QID) | ORAL | Status: DC
  Administered 2017-12-10 – 2017-12-13 (×13): 125 mg via ORAL
  Filled 2017-12-10 (×16): qty 2.5

## 2017-12-10 MED ORDER — TOPIRAMATE 25 MG PO TABS
25.0000 mg | ORAL_TABLET | Freq: Every day | ORAL | Status: DC
  Administered 2017-12-10: 25 mg via ORAL
  Filled 2017-12-10: qty 1

## 2017-12-10 MED ORDER — IOPAMIDOL (ISOVUE-300) INJECTION 61%
60.0000 mL | Freq: Once | INTRAVENOUS | Status: AC | PRN
  Administered 2017-12-10: 60 mL via INTRAVENOUS

## 2017-12-10 MED ORDER — DICLOFENAC SODIUM 1 % TD GEL
2.0000 g | Freq: Four times a day (QID) | TRANSDERMAL | Status: DC
  Administered 2017-12-10 – 2017-12-13 (×9): 2 g via TOPICAL
  Filled 2017-12-10: qty 100

## 2017-12-10 MED ORDER — KETOROLAC TROMETHAMINE 15 MG/ML IJ SOLN
15.0000 mg | Freq: Once | INTRAMUSCULAR | Status: AC
  Administered 2017-12-10: 15 mg via INTRAVENOUS
  Filled 2017-12-10: qty 1

## 2017-12-10 MED ORDER — MAGNESIUM SULFATE 2 GM/50ML IV SOLN
2.0000 g | Freq: Once | INTRAVENOUS | Status: AC
  Administered 2017-12-10: 2 g via INTRAVENOUS
  Filled 2017-12-10: qty 50

## 2017-12-10 NOTE — NC FL2 (Signed)
[place] LEVEL OF CARE SCREENING TOOL     IDENTIFICATION  Patient Name: Cindy Sullivan Birthdate: 1928-11-21 Sex: female Admission Date (Current Location): 12/08/2017  [place] and [place] Number:  Engineering geologist and Address:  [facility], [address] [place], [place] [address]      Provider Number: [id number]  Attending Physician Name and Address:  Loletha Grayer, MD  Relative Name and Phone Number:  Yunique Dearcos (Daughter) [phone] or Sallyanne Havers [facility]) [phone]    Current Level of Care: Hospital Recommended Level of Care: [place] Prior Approval Number:    Date Approved/Denied:   PASRR Number: [id number]  Discharge Plan: SNF    Current Diagnoses: Patient Active Problem List   Diagnosis Date Noted  . Acute colitis 12/08/2017  . UTI (urinary tract infection) 09/17/2017  . Sepsis ([place]) 06/27/2017  . SIRS (systemic inflammatory response syndrome) ([place]) 04/30/2017  . Type 2 diabetes mellitus ([place]) 01/18/2016  . Alzheimer's dementia, late onset 01/18/2016  . Iron deficiency anemia due to chronic blood loss 12/30/2015  . Chronic hyponatremia 04/07/2015  . Chronic kidney disease (CKD), stage III (moderate) ([place]) 04/07/2015  . Essential (primary) hypertension 04/07/2015  . Chronic diarrhea 02/03/2015  . Insomnia, persistent 02/02/2015  . Primary osteoarthritis of both knees 02/02/2015  . Pure hypercholesterolemia 09/12/2014    Orientation RESPIRATION BLADDER Height & Weight     Self  Normal Incontinent Weight: 106 lb (48.1 kg) Height:  4\' 9"  (144.8 cm)  BEHAVIORAL SYMPTOMS/MOOD NEUROLOGICAL BOWEL NUTRITION STATUS      Incontinent Diet(Heart healthy)  AMBULATORY STATUS COMMUNICATION OF NEEDS Skin   Extensive Assist Verbally Normal                       Personal Care Assistance Level of Assistance  Bathing, Feeding, Dressing Bathing Assistance: Limited assistance Feeding assistance:  Independent Dressing Assistance: Limited assistance     Functional Limitations Info             SPECIAL CARE FACTORS FREQUENCY  PT (By licensed PT)     PT Frequency: Up to 5/week              Contractures Contractures Info: Not present    Additional Factors Info  Code Status, Allergies Code Status Info: DNR Allergies Info: Ciprofloxacin, Codeine, Escitalopram Oxalate, Metronidazole, Other, Pantoprazole, Phenergan Promethazine Hcl, Sulfamethoxazole-trimethoprim           Current Medications (12/10/2017):  This is the current hospital active medication list Current Facility-Administered Medications  Medication Dose Route Frequency Provider Last Rate Last Dose  . 0.9 %  sodium chloride infusion   Intravenous Continuous Amelia Jo, MD 75 mL/hr at 12/10/17 [phone]    . acetaminophen (TYLENOL) tablet 650 mg  650 mg Oral Q6H PRN Amelia Jo, MD       Or  . acetaminophen (TYLENOL) suppository 650 mg  650 mg Rectal Q6H PRN Amelia Jo, MD      . amoxicillin-clavulanate (AUGMENTIN) 500-125 MG per tablet 500 mg  1 tablet Oral BID Demetrios Loll, MD   500 mg at 12/10/17 1028  . atorvastatin (LIPITOR) tablet 20 mg  20 mg Oral QHS Amelia Jo, MD   20 mg at 12/09/17 2014  . bisacodyl (DULCOLAX) EC tablet 5 mg  5 mg Oral Daily PRN Amelia Jo, MD      . diclofenac sodium (VOLTAREN) 1 % transdermal gel 2 g  2 g Topical QID Loletha Grayer, MD      .  heparin injection 5,000 Units  5,000 Units Subcutaneous Q8H Amelia Jo, MD   5,000 Units at 12/10/17 1029  . HYDROmorphone (DILAUDID) injection 0.5 mg  0.5 mg Intravenous Q4H PRN Amelia Jo, MD   0.5 mg at 12/10/17 1450  . insulin aspart (novoLOG) injection 0-9 Units  0-9 Units Subcutaneous TID WC Demetrios Loll, MD   2 Units at 12/10/17 1254  . memantine (NAMENDA) tablet 5 mg  5 mg Oral BID Amelia Jo, MD   5 mg at 12/10/17 1028  . ondansetron (ZOFRAN) tablet 4 mg  4 mg Oral Q6H PRN Amelia Jo, MD       Or  . ondansetron  [facility]) injection 4 mg  4 mg Intravenous Q6H PRN Amelia Jo, MD      . pantoprazole (PROTONIX) EC tablet 40 mg  40 mg Oral Daily Amelia Jo, MD   40 mg at 12/10/17 1028  . saccharomyces boulardii (FLORASTOR) capsule 250 mg  250 mg Oral BID Amelia Jo, MD   250 mg at 12/10/17 1028  . topiramate (TOPAMAX) tablet 25 mg  25 mg Oral QHS Wieting, Richard, MD      . traMADol Veatrice Bourbon) tablet 50 mg  50 mg Oral Q12H PRN Amelia Jo, MD   50 mg at 12/09/17 1443  . traZODone (DESYREL) tablet 25 mg  25 mg Oral QHS PRN Amelia Jo, MD   25 mg at 12/09/17 0148  . vancomycin (VANCOCIN) 50 mg/mL oral solution 125 mg  125 mg Oral Q6H Loletha Grayer, MD   125 mg at 12/10/17 1156   Facility-Administered Medications Ordered in Other Encounters  Medication Dose Route Frequency Provider Last Rate Last Dose  . sodium chloride 0.9 % injection 10 mL  10 mL Intravenous PRN Leia Alf, MD   10 mL at 03/03/15 1528     Discharge Medications: Please see discharge summary for a list of discharge medications.  Relevant Imaging Results:  Relevant Lab Results:   Additional Information [id number]  Zettie Pho, LCSW

## 2017-12-10 NOTE — Progress Notes (Signed)
Patient ID: Cindy Sullivan, female   DOB: 10-22-1929, 84 y.o.   MRN: [id number]  ACP note.  Patient and daughter at the bedside.  Patient has a mass in the chest.  Unclear what this is at this point.  CT scan of the chest with contrast recommended.  I will get this test.  Unsure if patient would want to get a biopsy of this at this point.  Even if she did get a biopsy I doubt she will be able to handle the treatment options.  First need to get better with C. difficile colitis and urinary tract infection.  Patient is a DO NOT RESUSCITATE  Patient has a lot of pain with her head and headache.  Pain control with medications.  Time spent on ACP discussion 17 minutes  Dr. Loletha Grayer

## 2017-12-10 NOTE — Care Management Note (Signed)
Case Management Note  Patient Details  Name: ZAMERIA VOGL MRN: [id number] Date of Birth: 12-17-1928  Subjective/Objective:      This Probation officer called into Mrs [facility] room again to see if daughter Tye Maryland was there and was told that daughter Tye Maryland was out of the hospital running errands. Left my number with niece Suanne Marker in the event that daughter Tye [place] returns to [facility] room today. Also left my number on daughter Cathy's cell phone.               Action/Plan:   Expected Discharge Date:                  Expected Discharge Plan:     In-House Referral:     Discharge planning Services     Post Acute Care Choice:    Choice offered to:     DME Arranged:    DME Agency:     HH Arranged:    HH Agency:     Status of Service:     If discussed at [employer] of Stay Meetings, dates discussed:    Additional Comments:  Celsey Asselin A, RN 12/10/2017, 2:47 PM

## 2017-12-10 NOTE — Care Management Note (Signed)
Case Management Note  Patient Details  Name: ANNLEIGH KNUEPPEL MRN: [id number] Date of Birth: [birth year]  Subjective/Objective:     Received a call back from daughter Madelline Eshbach. She expressed interest in rehab for her Mother, Mrs Francia Verry. Weekend CSW already on her way to discuss SNF with family per PT recommendation for SNF.                Action/Plan:   Expected Discharge Date:                  Expected Discharge Plan:     In-House Referral:     Discharge planning Services     Post Acute Care Choice:    Choice offered to:     DME Arranged:    DME Agency:     HH Arranged:    HH Agency:     Status of Service:     If discussed at [employer] of Stay Meetings, dates discussed:    Additional Comments:  Arlow Spiers A, RN 12/10/2017, 3:49 PM

## 2017-12-10 NOTE — Progress Notes (Signed)
Patient ID: Cindy Sullivan, female   DOB: [birth year], 84 y.o.   MRN: [id number]  [facility] PROGRESS NOTE  Cindy Sullivan EVO:[id number] DOB: [birth year] DOA: 12/08/2017 PCP: Glendon Axe, MD  HPI/Subjective: Patient complaining of diarrhea.  Patient complains of severe headache.  Patient complains of knee pain.  Some nausea.  Objective: Vitals:   12/09/17 2000 12/10/17 0618  BP: (!) 162/70 (!) 152/82  Pulse: 75 (!) 104  Resp: 18 18  Temp: 98.9 F (37.2 C) 98.5 F (36.9 C)  SpO2: 95% 97%    Filed Weights   12/08/17 1819 12/09/17 0106 12/10/17 0618  Weight: 46.3 kg (102 lb) 46.7 kg (103 lb) 48.1 kg (106 lb)    ROS: Review of Systems  Constitutional: Negative for chills and fever.  Eyes: Negative for blurred vision.  Respiratory: Negative for cough and shortness of breath.   Cardiovascular: Negative for chest pain.  Gastrointestinal: Positive for abdominal pain, diarrhea and nausea. Negative for constipation and vomiting.  Genitourinary: Negative for dysuria.  Musculoskeletal: Positive for joint pain and neck pain.  Neurological: Positive for headaches. Negative for dizziness.   Exam: Physical Exam  HENT:  Nose: No mucosal edema.  Mouth/Throat: No oropharyngeal exudate or posterior oropharyngeal edema.  Eyes: Conjunctivae, EOM and lids are normal. Pupils are equal, round, and reactive to light.  Neck: No JVD present. Carotid bruit is not present. No edema present. No thyroid mass and no thyromegaly present.  Cardiovascular: S1 normal and S2 normal. Exam reveals no gallop.  No murmur heard. Pulses:      Dorsalis pedis pulses are 2+ on the right side, and 2+ on the left side.  Respiratory: No respiratory distress. She has decreased breath sounds in the right lower field and the left lower field. She has no wheezes. She has no rhonchi. She has no rales.  GI: Soft. Bowel sounds are normal. There is generalized tenderness.  Musculoskeletal:       Right ankle: She exhibits  swelling.       Left ankle: She exhibits swelling.  Lymphadenopathy:    She has no cervical adenopathy.  Neurological: She is alert. No cranial nerve deficit.  Skin: Skin is warm. No rash noted. Nails show no clubbing.  Psychiatric: She has a normal mood and affect.      Data Reviewed: Basic Metabolic Panel: Recent Labs  Lab 12/08/17 1825 12/09/17 0015 12/09/17 1029 12/10/17 1251  NA 127* 132* 132* 130*  K 4.9 4.7 4.9 4.4  CL 97* 104 105 105  CO2 21* 19* 17* 17*  GLUCOSE 142* 106* 100* 179*  BUN 43* 37* 32* 24*  CREATININE 1.69* 1.35* 1.23* 1.02*  CALCIUM 8.3* 7.7* 8.2* 8.3*   Liver Function Tests: Recent Labs  Lab 12/08/17 1825  AST 25  ALT 13*  ALKPHOS 77  BILITOT 0.4  PROT 7.0  ALBUMIN 2.7*   Recent Labs  Lab 12/08/17 1825  LIPASE 16   CBC: Recent Labs  Lab 12/08/17 1825 12/09/17 1029  WBC 16.0* 13.6*  HGB 10.8* 11.6*  HCT 33.3* 35.5  MCV 84.2 85.3  PLT 398 372   Cardiac Enzymes: Recent Labs  Lab 12/08/17 1831 12/09/17 1029  TROPONINI 0.06* 0.08*    CBG: Recent Labs  Lab 12/09/17 0749 12/09/17 1727 12/09/17 2136 12/10/17 0829 12/10/17 1237  GLUCAP 87 86 118* 119* 176*    Recent Results (from the past 240 hour(s))  Microscopic Examination     Status: Abnormal   Collection Time: 12/06/17 12:09  PM  Result Value Ref Range Status   WBC, UA >30 (H) 0 - 5 /hpf Final   RBC, UA 0-2 0 - 2 /hpf Final   Epithelial Cells (non renal) None seen 0 - 10 /hpf Final   Mucus, UA Present (A) Not Estab. Final   Bacteria, UA Many (A) None seen/Few Final  CULTURE, URINE COMPREHENSIVE     Status: Abnormal   Collection Time: 12/06/17 12:26 PM  Result Value Ref Range Status   Urine Culture, Comprehensive Final report (A)  Final   Organism ID, Bacteria Escherichia coli (A)  Final    Comment: Greater than 100,000 colony forming units per mL   ANTIMICROBIAL SUSCEPTIBILITY Comment  Final    Comment:       ** S = Susceptible; I = Intermediate; R =  Resistant **                    P = Positive; N = Negative             MICS are expressed in micrograms per mL    Antibiotic                 RSLT#1    RSLT#2    RSLT#3    RSLT#4 Amoxicillin/Clavulanic Acid    S Ampicillin                     R Cefazolin                      R Cefepime                       I Ceftriaxone                    R Cefuroxime                     R Ciprofloxacin                  R Ertapenem                      S Gentamicin                     S Imipenem                       S Levofloxacin                   R Meropenem                      S Nitrofurantoin                 S Piperacillin/Tazobactam        S Tetracycline                   S Tobramycin                     S Trimethoprim/Sulfa             S   C difficile quick scan w PCR reflex     Status: Abnormal   Collection Time: 12/10/17  2:33 AM  Result Value Ref Range Status   C Diff antigen POSITIVE (A) NEGATIVE Final   C Diff toxin NEGATIVE NEGATIVE Final  C Diff interpretation Results are indeterminate. See PCR results.  Final    Comment: Performed at [facility], [address] [place], [place] [address]  Gastrointestinal Panel by PCR , Stool     Status: None   Collection Time: 12/10/17  2:33 AM  Result Value Ref Range Status   Campylobacter species NOT DETECTED NOT DETECTED Final   Plesimonas shigelloides NOT DETECTED NOT DETECTED Final   Salmonella species NOT DETECTED NOT DETECTED Final   Yersinia enterocolitica NOT DETECTED NOT DETECTED Final   Vibrio species NOT DETECTED NOT DETECTED Final   Vibrio cholerae NOT DETECTED NOT DETECTED Final   Enteroaggregative E coli (EAEC) NOT DETECTED NOT DETECTED Final   Enteropathogenic E coli (EPEC) NOT DETECTED NOT DETECTED Final   Enterotoxigenic E coli (ETEC) NOT DETECTED NOT DETECTED Final   Shiga like toxin producing E coli (STEC) NOT DETECTED NOT DETECTED Final   Shigella/Enteroinvasive E coli (EIEC) NOT DETECTED NOT DETECTED Final    Cryptosporidium NOT DETECTED NOT DETECTED Final   Cyclospora cayetanensis NOT DETECTED NOT DETECTED Final   Entamoeba histolytica NOT DETECTED NOT DETECTED Final   Giardia lamblia NOT DETECTED NOT DETECTED Final   Adenovirus F40/41 NOT DETECTED NOT DETECTED Final   Astrovirus NOT DETECTED NOT DETECTED Final   Norovirus GI/GII NOT DETECTED NOT DETECTED Final   Rotavirus A NOT DETECTED NOT DETECTED Final   Sapovirus (I, II, IV, and V) NOT DETECTED NOT DETECTED Final    Comment: Performed at [facility], [address] [place], [place] [address]  C. Diff by PCR, Reflexed     Status: Abnormal   Collection Time: 12/10/17  2:33 AM  Result Value Ref Range Status   Toxigenic C. Difficile by PCR POSITIVE (A) NEGATIVE Final    Comment: Positive for toxigenic C. difficile with little to no toxin production. Only treat if clinical presentation suggests symptomatic illness. Performed at [facility], [address], [place], [place] [address]      Studies: Ct Abdomen Pelvis Wo Contrast  Result Date: 12/08/2017 CLINICAL DATA:  Diarrhea earlier this morning. EXAM: CT ABDOMEN AND PELVIS WITHOUT CONTRAST TECHNIQUE: Multidetector CT imaging of the abdomen and pelvis was performed following the standard protocol without IV contrast. COMPARISON:  CT abdomen and pelvis from 04/30/2017 FINDINGS: Lower chest: Partially imaged 3.7 x 4.8 cm masslike abnormality along the ventral aspect of the heart, epicenter of the mass along the pericardium. Dedicated CT of the chest for further evaluation is recommended. This does not have the typical appearance of a pericardial cyst or lipoma. Partially included lymphoma or possibly mesothelioma among some other solid pathology that may predispose to this area though not exclusive. This is not apparent on the comparison exam. Heart is borderline enlarged with left main and 3 vessel coronary arteriosclerosis. There is aortic atherosclerosis. Hepatobiliary:  The unopacified liver demonstrates intrahepatic and extrahepatic ductal dilatation (to 21 mm currently) which can be seen in the prior setting of cholecystectomy. No choledocholithiasis. The patient is status post cholecystectomy. Calcified granuloma is noted in the right hepatic lobe. Pancreas: Atrophic without focal mass or ductal dilatation. Spleen: Normal Adrenals/Urinary Tract: Normal bilateral adrenal glands. No nephrolithiasis or hydronephrosis. Water attenuating 2.5 cm in diameter cyst is seen in the lower pole the left kidney unchanged in appearance. Smaller cysts are also noted of the left kidney as before. No hydroureteronephrosis. Urinary bladder is physiologically distended without calculus. Slight nodular thickening along the dependent and nondependent walls of the urinary bladder are  seen. Direct visual correlation is recommended for further evaluation to exclude a flat transitional cell lesion versus focal inflammatory thickening. Stomach/Bowel: Nondistended stomach with normal small bowel rotation. Scattered colonic diverticulosis without acute diverticulitis. No bowel obstruction. Status post appendectomy by report Vascular/Lymphatic: Moderate aortoiliac atherosclerosis without aneurysm. No lymphadenopathy. Reproductive: Uterus is surgically absent. No adnexal mass is noted. Other: No free air nor free fluid. Musculoskeletal: Mild degenerative disc disease of the thoracolumbar juncture. No acute nor suspicious osseous lesions. IMPRESSION: 1. A partially included, 3.7 x 4.8 cm solid appearing masslike abnormality is noted along the ventral aspect of the heart along the pericardium. This does not have the typical appearance of a more common epicardial cyst or lipoma. Dedicated CT with IV contrast is recommended for further correlation. More common solid pathology in this region may include a mesothelioma or partial imaging of lymphoma among some possibilities. 2. Left main and three-vessel coronary  arteriosclerosis. 3. Chronic dilatation of the intrahepatic and extrahepatic biliary system status post cholecystectomy. Choledocholithiasis. 4. Water attenuating cyst of the left lower pole of the kidney measuring 2.5 cm. 5. Eccentric mural thickening of the bladder along the dependent posterior wall. Direct visual correlation is recommended to exclude a transitional cell lesion versus inflammatory thickening or dependent debris. 6. Moderate aortoiliac atherosclerosis. 7. Colonic diverticulosis without acute diverticulitis. Electronically Signed   By: Ashley Royalty M.D.   On: 12/08/2017 20:26   US Pelvis (transabdominal Only)  Result Date: 12/09/2017 CLINICAL DATA:  Vaginal bleeding. EXAM: TRANSABDOMINAL ULTRASOUND OF PELVIS TECHNIQUE: Transabdominal ultrasound examination of the pelvis was performed including evaluation of the uterus, ovaries, adnexal regions, and pelvic cul-de-sac. COMPARISON:  Abdominal CT from yesterday. FINDINGS: Uterus Surgically absent.  Unremarkable cuff. Right ovary Not visualized.  Negative adnexa on preceding abdominal CT. Left ovary Not visualized. Negative adnexa on preceding abdominal CT. Other findings: There is posterior/inferior wall lobulated mural thickening concerning for a mass. This was also noted on previous CT. No visible extension of the mass into the vaginal cuff. IMPRESSION: 1. Hysterectomy with negative vaginal cuff. 2. Neither ovary could be visualized. The adnexa are negative on preceding abdominal CT. 3. Masslike thickening at the bladder base as noted on CT yesterday. Consider cystoscopy. Electronically Signed   By: Monte Fantasia M.D.   On: 12/09/2017 10:13    Scheduled Meds: . amoxicillin-clavulanate  1 tablet Oral BID  . atorvastatin  20 mg Oral QHS  . heparin  5,000 Units Subcutaneous Q8H  . insulin aspart  0-9 Units Subcutaneous TID WC  . memantine  5 mg Oral BID  . pantoprazole  40 mg Oral Daily  . saccharomyces boulardii  250 mg Oral BID  .  topiramate  25 mg Oral QHS  . vancomycin  125 mg Oral Q6H   Continuous Infusions: . sodium chloride 75 mL/hr at 12/10/17 0836    Assessment/Plan:  1. Clostridium difficile colitis.  Started p.o. vancomycin this morning.  Patient will need 14 days of p.o. vancomycin after stopping antibiotics for urinary tract infection. 2. Recurrent urinary tract infection.  E. coli.  Antibiotics changed to Augmentin. 3. Acute kidney injury.  Improved with IV fluid hydration 4. Hyponatremia.  Patient's sodium still little low at 130. 5. Chronic migraine.  Start Topamax.  Give 1 dose of Toradol and IV magnesium.  Family requested CT scan of the head 6. Hyperlipidemia unspecified on atorvastatin 7. Mass in chest.  CT scan of the chest for further evaluation 8. Weakness.  Physical therapy recommended rehab 9. Right  knee pain secondary to bone-on-bone.   Code Status:     Code Status Orders  (From admission, onward)        Start     Ordered   12/09/17 0120  Do not attempt resuscitation (DNR)  Continuous    Question Answer Comment  In the event of cardiac or respiratory ARREST Do not call a "code blue"   In the event of cardiac or respiratory ARREST Do not perform Intubation, CPR, defibrillation or ACLS   In the event of cardiac or respiratory ARREST Use medication by any route, position, wound care, and other measures to relive pain and suffering. May use oxygen, suction and manual treatment of airway obstruction as needed for comfort.      12/09/17 0119    Code Status History    Date Active Date Inactive Code Status Order ID Comments User Context   09/18/2017 00:54 09/20/2017 15:13 DNR [id number]  Gorden Harms, MD Inpatient   06/27/2017 20:10 06/29/2017 21:18 Full Code [id number]  Epifanio Lesches, MD ED   05/01/2017 01:20 05/02/2017 14:46 Full Code [id number]  Harvie Bridge, DO Inpatient   04/13/2016 21:15 04/17/2016 18:30 Full Code [id number]  Henreitta Leber, MD Inpatient     Family  Communication: Daughter at bedside Disposition Plan: Likely out to rehab once diarrhea settles down  Antibiotics:  Oral vancomycin  Oral Augmentin  Time spent: 28 minutes  [place]

## 2017-12-10 NOTE — Care Management Note (Addendum)
Case Management Note  Patient Details  Name: Cindy Sullivan MRN: [id number] Date of Birth: 03-Jun-1929  Subjective/Objective:     Received message from staff that Cindy Cindy Sullivan had questions about Cindy Sullivan. This Probation officer attempted to contact Cindy Loreda Sullivan in room 252 and was informed that Cindy Sullivan had "gone out to lunch." This Probation officer then called Cindy Sullivan cell phone number and left my call back number when no one answered. Will attempt to contact Cindy Sullivan again later today. [facility]-PT is recommending SNF.               Action/Plan:   Expected Discharge Date:                  Expected Discharge Plan:     In-House Referral:     Discharge planning Services     Post Acute Care Choice:    Choice offered to:     DME Arranged:    DME Agency:     HH Arranged:    HH Agency:     Status of Service:     If discussed at [employer] of Stay Meetings, dates discussed:    Additional Comments:  Cindy Sullivan A, RN 12/10/2017, 12:55 PM

## 2017-12-10 NOTE — Clinical Social Work Note (Signed)
CSW met with the patient and family at bedside to discuss discharge planning. The patient's daughter gave verbal permission to conduct referral for STR. CSW will enter full assessment when able. Referral has been sent.  Santiago Bumpers, MSW, Latanya Presser [phone]

## 2017-12-11 LAB — BASIC METABOLIC PANEL
Anion gap: 10 (ref 5–15)
BUN: 18 mg/dL (ref 6–20)
CALCIUM: 8.4 mg/dL — AB (ref 8.9–10.3)
CHLORIDE: 107 mmol/L (ref 101–111)
CO2: 16 mmol/L — AB (ref 22–32)
CREATININE: 1.03 mg/dL — AB (ref 0.44–1.00)
GFR calc non Af Amer: 47 mL/min — ABNORMAL LOW (ref >60)
GFR, EST AFRICAN AMERICAN: 55 mL/min — AB (ref >60)
GLUCOSE: 136 mg/dL — AB (ref 65–99)
Potassium: 4.1 mmol/L (ref 3.5–5.1)
Sodium: 133 mmol/L — ABNORMAL LOW (ref 135–145)

## 2017-12-11 LAB — GLUCOSE, CAPILLARY
GLUCOSE-CAPILLARY: 127 mg/dL — AB (ref 65–99)
GLUCOSE-CAPILLARY: 112 mg/dL — AB (ref 65–99)
Glucose-Capillary: 122 mg/dL — ABNORMAL HIGH (ref 65–99)
Glucose-Capillary: 114 mg/dL — ABNORMAL HIGH (ref 65–99)

## 2017-12-11 MED ORDER — PROPRANOLOL HCL 20 MG PO TABS
20.0000 mg | ORAL_TABLET | Freq: Three times a day (TID) | ORAL | Status: DC
  Administered 2017-12-11 – 2017-12-13 (×8): 20 mg via ORAL
  Filled 2017-12-11 (×9): qty 1

## 2017-12-11 MED ORDER — LORAZEPAM 2 MG/ML IJ SOLN
0.5000 mg | Freq: Once | INTRAMUSCULAR | Status: AC
  Administered 2017-12-11: 0.5 mg via INTRAVENOUS
  Filled 2017-12-11: qty 1

## 2017-12-11 NOTE — Progress Notes (Signed)
Patient admitted for acute colitis w/ diarrhea and has been c/o headaches. Patient was placed on topamax for headaches; however, considering patient has diarrhea and losing electrolytes as well as her bicarbonate being low, topamax can further lower bicarb, spoke to MD to switch to propranolol.  Since patient has headaches and is hypertensive w/ tachycardia spoke to MD to switch to propranolol to control patient's headaches as well as HTN/tachycardia -- this is also secondary to volume loss s/t diarrhea.  MD notified and agrees w/ plan.  Tobie Lords, PharmD, BCPS Clinical Pharmacist 12/11/2017

## 2017-12-11 NOTE — Progress Notes (Signed)
Patient ID: Cindy Sullivan, female   DOB: 1928-11-16, 84 y.o.   MRN: [id number]  Patient ID: Cindy Sullivan, female   DOB: 10-Mar-1929, 84 y.o.   MRN: [id number]  [facility] PROGRESS NOTE  SONJI STARKES YQI:[id number] DOB: 1929-03-10 DOA: 12/08/2017 PCP: Glendon Axe, MD  HPI/Subjective: Patient complaining of diarrhea.  Patient complains of severe headache.  Patient complains of knee pain.  Some nausea.  Objective: Vitals:   12/11/17 0435 12/11/17 0806  BP: (!) 152/87 (!) 117/99  Pulse: (!) 111 (!) 127  Resp: 17 18  Temp: 98.9 F (37.2 C) 98.1 F (36.7 C)  SpO2: 96% 94%    Filed Weights   12/09/17 0106 12/10/17 0618 12/11/17 0435  Weight: 46.7 kg (103 lb) 48.1 kg (106 lb) 49.9 kg (110 lb)    ROS: Review of Systems  Unable to perform ROS: Acuity of condition  Respiratory: Negative for shortness of breath.   Cardiovascular: Negative for chest pain.  Gastrointestinal: Negative for abdominal pain.   Exam: Physical Exam  HENT:  Nose: No mucosal edema.  Mouth/Throat: No oropharyngeal exudate or posterior oropharyngeal edema.  Eyes: Conjunctivae, EOM and lids are normal. Pupils are equal, round, and reactive to light.  Neck: No JVD present. Carotid bruit is not present. No edema present. No thyroid mass and no thyromegaly present.  Cardiovascular: S1 normal and S2 normal. Exam reveals no gallop.  No murmur heard. Pulses:      Dorsalis pedis pulses are 2+ on the right side, and 2+ on the left side.  Respiratory: No respiratory distress. She has decreased breath sounds in the right lower field and the left lower field. She has no wheezes. She has no rhonchi. She has no rales.  GI: Soft. Bowel sounds are normal. There is generalized tenderness.  Musculoskeletal:       Right ankle: She exhibits swelling.       Left ankle: She exhibits swelling.  Lymphadenopathy:    She has no cervical adenopathy.  Neurological: She is alert. No cranial nerve deficit.  Skin: Skin is warm. No  rash noted. Nails show no clubbing.  Psychiatric: She has a normal mood and affect.      Data Reviewed: Basic Metabolic Panel: Recent Labs  Lab 12/09/17 0015 12/09/17 1029 12/10/17 1251 12/10/17 1606 12/11/17 0711  NA 132* 132* 130* 132* 133*  K 4.7 4.9 4.4 4.6 4.1  CL 104 105 105 107 107  CO2 19* 17* 17* 18* 16*  GLUCOSE 106* 100* 179* 152* 136*  BUN 37* 32* 24* 24* 18  CREATININE 1.35* 1.23* 1.02* 0.93 1.03*  CALCIUM 7.7* 8.2* 8.3* 8.1* 8.4*   Liver Function Tests: Recent Labs  Lab 12/08/17 1825 12/10/17 1606  AST 25 32  ALT 13* 20  ALKPHOS 77 90  BILITOT 0.4 0.6  PROT 7.0 6.2*  ALBUMIN 2.7* 2.2*   Recent Labs  Lab 12/08/17 1825  LIPASE 16   CBC: Recent Labs  Lab 12/08/17 1825 12/09/17 1029  WBC 16.0* 13.6*  HGB 10.8* 11.6*  HCT 33.3* 35.5  MCV 84.2 85.3  PLT 398 372   Cardiac Enzymes: Recent Labs  Lab 12/08/17 1831 12/09/17 1029  TROPONINI 0.06* 0.08*    CBG: Recent Labs  Lab 12/10/17 1237 12/10/17 1721 12/10/17 2118 12/11/17 0747 12/11/17 1219  GLUCAP 176* 124* 110* 127* 122*    Recent Results (from the past 240 hour(s))  Microscopic Examination     Status: Abnormal   Collection Time: 12/06/17 12:09 PM  Result Value Ref Range Status   WBC, UA >30 (H) 0 - 5 /hpf Final   RBC, UA 0-2 0 - 2 /hpf Final   Epithelial Cells (non renal) None seen 0 - 10 /hpf Final   Mucus, UA Present (A) Not Estab. Final   Bacteria, UA Many (A) None seen/Few Final  CULTURE, URINE COMPREHENSIVE     Status: Abnormal   Collection Time: 12/06/17 12:26 PM  Result Value Ref Range Status   Urine Culture, Comprehensive Final report (A)  Final   Organism ID, Bacteria Escherichia coli (A)  Final    Comment: Greater than 100,000 colony forming units per mL   ANTIMICROBIAL SUSCEPTIBILITY Comment  Final    Comment:       ** S = Susceptible; I = Intermediate; R = Resistant **                    P = Positive; N = Negative             MICS are expressed in  micrograms per mL    Antibiotic                 RSLT#1    RSLT#2    RSLT#3    RSLT#4 Amoxicillin/Clavulanic Acid    S Ampicillin                     R Cefazolin                      R Cefepime                       I Ceftriaxone                    R Cefuroxime                     R Ciprofloxacin                  R Ertapenem                      S Gentamicin                     S Imipenem                       S Levofloxacin                   R Meropenem                      S Nitrofurantoin                 S Piperacillin/Tazobactam        S Tetracycline                   S Tobramycin                     S Trimethoprim/Sulfa             S   Urine culture     Status: None   Collection Time: 12/08/17  8:39 PM  Result Value Ref Range Status   Specimen Description   Final    URINE, CATHETERIZED Performed at [facility], [address], [place], [place] [address]  Special Requests   Final    NONE Performed at [facility], [address] [place], [place] [address]    Culture   Final    NO GROWTH Performed at [facility], [place] [address] [place], [place] [address]    Report Status 12/10/2017 FINAL  Final  C difficile quick scan w PCR reflex     Status: Abnormal   Collection Time: 12/10/17  2:33 AM  Result Value Ref Range Status   C Diff antigen POSITIVE (A) NEGATIVE Final   C Diff toxin NEGATIVE NEGATIVE Final   C Diff interpretation Results are indeterminate. See PCR results.  Final    Comment: Performed at [facility], [address] [place], [place] [address]  Gastrointestinal Panel by PCR , Stool     Status: None   Collection Time: 12/10/17  2:33 AM  Result Value Ref Range Status   Campylobacter species NOT DETECTED NOT DETECTED Final   Plesimonas shigelloides NOT DETECTED NOT DETECTED Final   Salmonella species NOT DETECTED NOT DETECTED Final   Yersinia enterocolitica NOT DETECTED NOT DETECTED Final   Vibrio species NOT  DETECTED NOT DETECTED Final   Vibrio cholerae NOT DETECTED NOT DETECTED Final   Enteroaggregative E coli (EAEC) NOT DETECTED NOT DETECTED Final   Enteropathogenic E coli (EPEC) NOT DETECTED NOT DETECTED Final   Enterotoxigenic E coli (ETEC) NOT DETECTED NOT DETECTED Final   Shiga like toxin producing E coli (STEC) NOT DETECTED NOT DETECTED Final   Shigella/Enteroinvasive E coli (EIEC) NOT DETECTED NOT DETECTED Final   Cryptosporidium NOT DETECTED NOT DETECTED Final   Cyclospora cayetanensis NOT DETECTED NOT DETECTED Final   Entamoeba histolytica NOT DETECTED NOT DETECTED Final   Giardia lamblia NOT DETECTED NOT DETECTED Final   Adenovirus F40/41 NOT DETECTED NOT DETECTED Final   Astrovirus NOT DETECTED NOT DETECTED Final   Norovirus GI/GII NOT DETECTED NOT DETECTED Final   Rotavirus A NOT DETECTED NOT DETECTED Final   Sapovirus (I, II, IV, and V) NOT DETECTED NOT DETECTED Final    Comment: Performed at [facility], [address] [place], [place] [address]  C. Diff by PCR, Reflexed     Status: Abnormal   Collection Time: 12/10/17  2:33 AM  Result Value Ref Range Status   Toxigenic C. Difficile by PCR POSITIVE (A) NEGATIVE Final    Comment: Positive for toxigenic C. difficile with little to no toxin production. Only treat if clinical presentation suggests symptomatic illness. Performed at [facility], [address] [place], [place] [address]      Studies: Ct Head Wo Contrast  Result Date: 12/10/2017 CLINICAL DATA:  Severe headache. EXAM: CT HEAD WITHOUT CONTRAST TECHNIQUE: Contiguous axial images were obtained from the base of the skull through the vertex without intravenous contrast. COMPARISON:  Head CT 11/27/2012 and MRI 11/28/2012 FINDINGS: Brain: There is no evidence of acute infarct, intracranial hemorrhage, mass, midline shift, or extra-axial fluid collection. The ventricles and sulci are within normal limits for age. Chronic lacunar infarcts are present in  the basal ganglia and thalami bilaterally, including an interval chronic lacunar in the right caudate. Patchy to confluent subcortical and periventricular white matter hypodensities have progressed and are nonspecific but compatible with moderate to severe chronic small vessel ischemic disease. Vascular: Calcified atherosclerosis at the skull base. No hyperdense vessel. Skull: No fracture or focal osseous lesion. Sinuses/Orbits: Mild scattered paranasal sinus mucosal thickening. Trace left mastoid effusion. Unremarkable orbits. Other: None. IMPRESSION: 1. No  evidence of acute intracranial abnormality. 2. Moderate to severe chronic small vessel ischemic disease, progressed from 2014. Electronically Signed   By: Logan Bores M.D.   On: 12/10/2017 14:02   Ct Chest W Contrast  Result Date: 12/10/2017 CLINICAL DATA:  Chest mass seen on prior CT abdomen and pelvis. EXAM: CT CHEST WITH CONTRAST TECHNIQUE: Multidetector CT imaging of the chest was performed during intravenous contrast administration. CONTRAST:  15mL ISOVUE-300 IOPAMIDOL (ISOVUE-300) INJECTION 61% COMPARISON:  12/08/2017 CT abdomen and pelvis, 04/30/2017 CXR FINDINGS: Cardiovascular: Heart is top-normal in size with trace pericardial effusion. Mitral annular calcifications are identified in addition to coronary arteriosclerosis. Aortic atherosclerosis without aneurysm or dissection. Catheter tip within the SVC terminates at the cavoatrial junction. Mediastinum/Nodes: Heterogeneously enhancing lobular anterior mediastinal mass with central areas of hypodensity suggesting necrosis is identified within the mediastinum and measures approximately 7.9 x 3.9 x 6.6 cm. There is positive mass effect on the right atrium. This mass abuts the ascending thoracic aorta, main pulmonary artery and left heart border causing slight positive mass effect. A 14 mm short axis right lower paratracheal lymph node is also present. Additional subdiaphragmatic mildly enlarged  lymph nodes are seen on the right measuring up to 6 mm short axis. No supraclavicular adenopathy. No enlarged axillary lymph nodes. The esophagus, trachea and mainstem bronchi are unremarkable. Normal branch pattern of the great vessels with atherosclerosis. No thyromegaly or mass. Lungs/Pleura: Small right greater than left pleural effusions with adjacent atelectasis. Faint nonspecific ground-glass opacities in both upper lobes and lingula. No dominant mass identified within the lungs. Upper Abdomen: Redemonstration of intra and extrahepatic ductal dilatation. No adrenal mass. No splenomegaly. No acute upper abdominal abnormality. Musculoskeletal: No aggressive osseous lesions. Degenerative changes are noted along the dorsal spine. IMPRESSION: 1. Lobular anterior mediastinal mass with scattered areas of central hypodensity consistent with necrosis. Overall this mass measures 7.9 x 3.9 x 6.6 cm and causes slight positive mass effect on adjacent vascular and cardiac structures. Favor lymphoma, more likely non-Hodgkin's lymphoma given lack of significant mediastinal or hilar lymphadenopathy elsewhere apart from a solitary 14 mm short axis right lower paratracheal lymph node and a few mildly enlarged subdiaphragmatic lymph nodes in the right upper quadrant. Metastatic disease is also a possibility though believed less likely. This mass is not contiguous with the thyroid to suggest a substernal goiter. 2. Small bilateral pleural effusions right greater than left with atelectasis. A few faint ground-glass opacities in the upper lobes and lingula are nonspecific and may represent minimal postinfectious or postinflammatory change. Aortic Atherosclerosis (ICD10-I70.0). Electronically Signed   By: Ashley Royalty M.D.   On: 12/10/2017 17:55    Scheduled Meds: . amoxicillin-clavulanate  1 tablet Oral BID  . atorvastatin  20 mg Oral QHS  . diclofenac sodium  2 g Topical QID  . insulin aspart  0-9 Units Subcutaneous TID WC   . memantine  5 mg Oral BID  . pantoprazole  40 mg Oral Daily  . propranolol  20 mg Oral TID  . saccharomyces boulardii  250 mg Oral BID  . vancomycin  125 mg Oral Q6H   Continuous Infusions:   Assessment/Plan:  1. Clostridium difficile colitis.  Started p.o. vancomycin yesterday.  Patient will need 14 days of p.o. vancomycin after stopping antibiotics for urinary tract infection. 2. Recurrent urinary tract infection.  E. coli.  Antibiotics changed to Augmentin. 3. Acute kidney injury.  Improved with IV fluid hydration. 4. Hyponatremia.  Patient's sodium still little low at 133. 5.  Chronic migraine.  Start propranolol.  6. Hyperlipidemia unspecified on atorvastatin 7. Mediastinal mass.  CT guided biopsy ordered for tomorrow with interventional radiology. 8. Weakness.  Physical therapy recommended rehab 9. Right knee pain secondary to bone-on-bone. 10. Delirium secondary to Ativan.  Try trazodone at night   Code Status:     Code Status Orders  (From admission, onward)        Start     Ordered   12/09/17 0120  Do not attempt resuscitation (DNR)  Continuous    Question Answer Comment  In the event of cardiac or respiratory ARREST Do not call a "code blue"   In the event of cardiac or respiratory ARREST Do not perform Intubation, CPR, defibrillation or ACLS   In the event of cardiac or respiratory ARREST Use medication by any route, position, wound care, and other measures to relive pain and suffering. May use oxygen, suction and manual treatment of airway obstruction as needed for comfort.      12/09/17 0119    Code Status History    Date Active Date Inactive Code Status Order ID Comments User Context   09/18/2017 00:54 09/20/2017 15:13 DNR [id number]  Gorden Harms, MD Inpatient   06/27/2017 20:10 06/29/2017 21:18 Full Code [id number]  Epifanio Lesches, MD ED   05/01/2017 01:20 05/02/2017 14:46 Full Code [id number]  Harvie Bridge, DO Inpatient   04/13/2016 21:15 04/17/2016  18:30 Full Code [id number]  Henreitta Leber, MD Inpatient     Family Communication: Daughter at bedside Disposition Plan: Will need rehab potentially Tuesday or Wednesday depending on clinical course  Antibiotics:  Oral vancomycin  Oral Augmentin  Time spent: 27 minutes  Uncertain

## 2017-12-11 NOTE — Clinical Social Work Note (Signed)
Clinical Social Work Assessment  Patient Details  Name: Cindy Sullivan MRN: [id number] Date of Birth: Sep 16, 1929  Date of referral:  12/10/17               Reason for consult:  Facility Placement                Permission sought to share information with:  Chartered certified accountant granted to share information::  Yes, Verbal Permission Granted  Name::        Agency::     Relationship::     Contact Information:     Housing/Transportation Living arrangements for the past 2 months:  Single Family Home Source of Information:  Patient, Adult Children, Medical Team, Siblings Patient Interpreter Needed:  None Criminal Activity/Legal Involvement Pertinent to Current Situation/Hospitalization:  No - Comment as needed Significant Relationships:  Adult Children, Community Support, Other Family Members, Siblings Lives with:  Self Do you feel safe going back to the place where you live?  Yes Need for family participation in patient care:  No (Coment)  Care giving concerns:  PT recommendation for STR   Social Worker assessment / plan:  CSW met with the patient, her brother, and her daughter at bedside to discuss discharge planning. The patient verbalized permission for her family to make decisions regarding her care. The patient's daughter and brother both agreed that STR would be their preferred discharge plan, and they verbalized permission to conduct the referral. The family stated they would prefer [facility] if beds are offered. The CSW explained the referral process and the Medicare guidelines for SNF coverage. The patient's family verbalized understanding.  At baseline, the patient lives alone in a single family home; however, according to her daughter, the patient is never truly alone as a family member is always with her. The CSW has sent the referral and will provide bed offers when available. The discharge plan is for Monday or Tuesday pending medical clearance. The CSW is  following to continue discharge facilitation.  Employment status:  Retired Forensic scientist:  [employer] PT Recommendations:  [place] / Referral to community resources:  [place]  Patient/Family's Response to care:  The patient and family thanked the CSW for assistance and were visibly relieved with the discharge plan for SNF.  Patient/Family's Understanding of and Emotional Response to Diagnosis, Current Treatment, and Prognosis:  The family is in agreement with the discharge plan and PT recommendation.  Emotional Assessment Appearance:  Appears stated age Attitude/Demeanor/Rapport:  Engaged, Gracious Affect (typically observed):  Appropriate, Pleasant Orientation:  Oriented to Self, Oriented to Situation Alcohol / Substance use:  Never Used Psych involvement (Current and /or in the community):  No (Comment)  Discharge Needs  Concerns to be addressed:  Care Coordination, Discharge Planning Concerns Readmission within the last 30 days:  Yes Current discharge risk:  Chronically ill Barriers to Discharge:  Continued Medical Work up   [employer], LCSW 12/11/2017, 9:11 AM

## 2017-12-12 ENCOUNTER — Inpatient Hospital Stay: Payer: Medicare Other

## 2017-12-12 LAB — PROTIME-INR
INR: 1.3
PROTHROMBIN TIME: 16.1 s — AB (ref 11.4–15.2)

## 2017-12-12 LAB — GLUCOSE, CAPILLARY
GLUCOSE-CAPILLARY: 101 mg/dL — AB (ref 65–99)
Glucose-Capillary: 96 mg/dL (ref 65–99)
Glucose-Capillary: 75 mg/dL (ref 65–99)
Glucose-Capillary: 105 mg/dL — ABNORMAL HIGH (ref 65–99)

## 2017-12-12 LAB — APTT: aPTT: 38 seconds — ABNORMAL HIGH (ref 24–36)

## 2017-12-12 MED ORDER — FENTANYL CITRATE (PF) 100 MCG/2ML IJ SOLN
INTRAMUSCULAR | Status: DC
Start: 2017-12-12 — End: 2017-12-12
  Filled 2017-12-12: qty 4

## 2017-12-12 MED ORDER — SODIUM CHLORIDE 0.9 % IV SOLN: INTRAVENOUS | Status: DC

## 2017-12-12 MED ORDER — MIDAZOLAM HCL 5 MG/5ML IJ SOLN
INTRAMUSCULAR | Status: AC
  Filled 2017-12-12: qty 5

## 2017-12-12 MED ORDER — HEPARIN SODIUM (PORCINE) 5000 UNIT/ML IJ SOLN
5000.0000 [IU] | Freq: Three times a day (TID) | INTRAMUSCULAR | Status: DC
  Administered 2017-12-12 – 2017-12-13 (×2): 5000 [IU] via SUBCUTANEOUS
  Filled 2017-12-12 (×2): qty 1

## 2017-12-12 MED ORDER — LIDOCAINE HCL (PF) 1 % IJ SOLN
INTRAMUSCULAR | Status: AC | PRN
  Administered 2017-12-12: 8 mL

## 2017-12-12 NOTE — Procedures (Signed)
CT mediastinal biopsy without difficulty.  Complications:  None  Blood Loss: none  See dictation in canopy pacs

## 2017-12-12 NOTE — Progress Notes (Signed)
Physical Therapy Treatment Patient Details Name: Cindy Sullivan MRN: [id number] DOB: 08/20/1929 Today's Date: 12/12/2017    History of Present Illness Pt admitted for acute colitis. History includes multiple UTI, dementia, GERD, HTN, and DM. Pt with complaints of diarrhea and cramps. Pt reports she is in contant pain "everywhere".    PT Comments    Pt is making gradual progress towards goals. Pt motivated to participate with therapy, however due to incontinent episodes, pt fatigues quickly prior to gait trials. Pt more alert this date and follows commands well. Good endurance with there-ex. Pt scheduled for biopsy later today. Will continue to progress as able.    Follow Up Recommendations  SNF     Equipment Recommendations  None recommended by PT    Recommendations for Other Services       Precautions / Restrictions Precautions Precautions: Fall Restrictions Weight Bearing Restrictions: No    Mobility  Bed Mobility Overal bed mobility: Needs Assistance Bed Mobility: Supine to Sit     Supine to sit: Min assist     General bed mobility comments: able to initiate B LEs off of bed, however needs assist for trunkal elevation. Once seated at EOB, able to sit with upright posture.  Transfers Overall transfer level: Needs assistance Equipment used: Rolling walker (2 wheeled) Transfers: Sit to/from Stand Sit to Stand: Mod assist         General transfer comment: upon standing, pt has incontinent episode despite diaper applied. Needs multiple transfers for changing gown, diaper. Pt fatigues with mobility, with heavy forward flexion and B UE use on RW. Unable to further take steps today  Ambulation/Gait             General Gait Details: unable due to fatigue with 2 incontinent episodes   Stairs            Wheelchair Mobility    Modified Rankin (Stroke Patients Only)       Balance                                            Cognition  Arousal/Alertness: Awake/alert Behavior During Therapy: WFL for tasks assessed/performed Overall Cognitive Status: History of cognitive impairments - at baseline                                        Exercises Other Exercises Other Exercises: supine ther-ex performed on B LE including hip flexion, hip abd/add, SLRs, and SAQ. All ther-ex performed x 12 reps with cga and cues for technique. Other Exercises: Prior to mobility, pt able to roll x B sides with cga for donning diaper. During mobility pt had incontient episode needing to perform rolling in bed for bed change and new diaper along with multiple standing attempts for cleaning. Pt fatigues quickly    General Comments        Pertinent Vitals/Pain Pain Assessment: Faces Faces Pain Scale: Hurts a little bit Pain Location: "everywhere" Pain Descriptors / Indicators: Constant;Discomfort;Dull Pain Intervention(s): Limited activity within patient's tolerance;RN gave pain meds during session    Home Living                      Prior Function            PT Goals (  current goals can now be found in the care plan section) Acute Rehab PT Goals Patient Stated Goal: to be able to walk to the bathroom PT Goal Formulation: With patient Time For Goal Achievement: 12/23/17 Potential to Achieve Goals: Good Progress towards PT goals: Progressing toward goals    Frequency    Min 2X/week      PT Plan Current plan remains appropriate    Co-evaluation              AM-PAC PT "6 Clicks" Daily Activity  Outcome Measure  Difficulty turning over in bed (including adjusting bedclothes, sheets and blankets)?: Unable Difficulty moving from lying on back to sitting on the side of the bed? : Unable Difficulty sitting down on and standing up from a chair with arms (e.g., wheelchair, bedside commode, etc,.)?: Unable Help needed moving to and from a bed to chair (including a wheelchair)?: A Little Help needed  walking in hospital room?: A Lot Help needed climbing 3-5 steps with a railing? : Total 6 Click Score: 9    End of Session Equipment Utilized During Treatment: Gait belt Activity Tolerance: Patient tolerated treatment well Patient left: in bed;with bed alarm set;with family/visitor present Nurse Communication: Mobility status PT Visit Diagnosis: Unsteadiness on feet (R26.81);Muscle weakness (generalized) (M62.81);Difficulty in walking, not elsewhere classified (R26.2);Pain Pain - Right/Left: Right Pain - part of body: Leg     Time: 0911-0949 PT Time Calculation (min) (ACUTE ONLY): 38 min  Charges:  $Therapeutic Exercise: 8-22 mins $Therapeutic Activity: 23-37 mins                    G Codes:       Greggory Stallion, PT, DPT [phone]    Loralei Radcliffe 12/12/2017, 11:48 AM

## 2017-12-12 NOTE — Progress Notes (Signed)
Patient ID: Cindy Sullivan, female   DOB: [birth year], 84 y.o.   MRN: [id number]  [facility] PROGRESS NOTE  Cindy Sullivan YKD:[id number] DOB: 07-27-1929 DOA: 12/08/2017 PCP: Glendon Axe, MD  HPI/Subjective: Patient doing a little bit better this morning.  More alert.  Answers some questions.  Feels okay.  Objective: Vitals:   12/12/17 1427 12/12/17 1501  BP: (!) 155/67 (!) 167/71  Pulse: 87 89  Resp:    Temp:    SpO2: 93% 92%    Filed Weights   12/09/17 0106 12/10/17 0618 12/11/17 0435  Weight: 46.7 kg (103 lb) 48.1 kg (106 lb) 49.9 kg (110 lb)    ROS: Review of Systems  Unable to perform ROS: Acuity of condition  Respiratory: Negative for shortness of breath.   Cardiovascular: Negative for chest pain.  Gastrointestinal: Positive for diarrhea. Negative for abdominal pain.  Musculoskeletal: Positive for joint pain.   Exam: Physical Exam  HENT:  Nose: No mucosal edema.  Mouth/Throat: No oropharyngeal exudate or posterior oropharyngeal edema.  Eyes: Conjunctivae, EOM and lids are normal. Pupils are equal, round, and reactive to light.  Neck: No JVD present. Carotid bruit is not present. No edema present. No thyroid mass and no thyromegaly present.  Cardiovascular: S1 normal and S2 normal. Exam reveals no gallop.  No murmur heard. Pulses:      Dorsalis pedis pulses are 2+ on the right side, and 2+ on the left side.  Respiratory: No respiratory distress. She has decreased breath sounds in the right lower field and the left lower field. She has no wheezes. She has no rhonchi. She has no rales.  GI: Soft. Bowel sounds are normal. There is generalized tenderness.  Musculoskeletal:       Right ankle: She exhibits swelling.       Left ankle: She exhibits swelling.  Lymphadenopathy:    She has no cervical adenopathy.  Neurological: She is alert. No cranial nerve deficit.  Skin: Skin is warm. No rash noted. Nails show no clubbing.  Psychiatric: She has a normal mood and  affect.      Data Reviewed: Basic Metabolic Panel: Recent Labs  Lab 12/09/17 0015 12/09/17 1029 12/10/17 1251 12/10/17 1606 12/11/17 0711  NA 132* 132* 130* 132* 133*  K 4.7 4.9 4.4 4.6 4.1  CL 104 105 105 107 107  CO2 19* 17* 17* 18* 16*  GLUCOSE 106* 100* 179* 152* 136*  BUN 37* 32* 24* 24* 18  CREATININE 1.35* 1.23* 1.02* 0.93 1.03*  CALCIUM 7.7* 8.2* 8.3* 8.1* 8.4*   Liver Function Tests: Recent Labs  Lab 12/08/17 1825 12/10/17 1606  AST 25 32  ALT 13* 20  ALKPHOS 77 90  BILITOT 0.4 0.6  PROT 7.0 6.2*  ALBUMIN 2.7* 2.2*   Recent Labs  Lab 12/08/17 1825  LIPASE 16   CBC: Recent Labs  Lab 12/08/17 1825 12/09/17 1029  WBC 16.0* 13.6*  HGB 10.8* 11.6*  HCT 33.3* 35.5  MCV 84.2 85.3  PLT 398 372   Cardiac Enzymes: Recent Labs  Lab 12/08/17 1831 12/09/17 1029  TROPONINI 0.06* 0.08*    CBG: Recent Labs  Lab 12/11/17 1219 12/11/17 1643 12/11/17 2153 12/12/17 0831 12/12/17 1146  GLUCAP 122* 112* 114* 96 75    Recent Results (from the past 240 hour(s))  Microscopic Examination     Status: Abnormal   Collection Time: 12/06/17 12:09 PM  Result Value Ref Range Status   WBC, UA >30 (H) 0 - 5 /hpf Final  RBC, UA 0-2 0 - 2 /hpf Final   Epithelial Cells (non renal) None seen 0 - 10 /hpf Final   Mucus, UA Present (A) Not Estab. Final   Bacteria, UA Many (A) None seen/Few Final  CULTURE, URINE COMPREHENSIVE     Status: Abnormal   Collection Time: 12/06/17 12:26 PM  Result Value Ref Range Status   Urine Culture, Comprehensive Final report (A)  Final   Organism ID, Bacteria Escherichia coli (A)  Final    Comment: Greater than 100,000 colony forming units per mL   ANTIMICROBIAL SUSCEPTIBILITY Comment  Final    Comment:       ** S = Susceptible; I = Intermediate; R = Resistant **                    P = Positive; N = Negative             MICS are expressed in micrograms per mL    Antibiotic                 RSLT#1    RSLT#2    RSLT#3     RSLT#4 Amoxicillin/Clavulanic Acid    S Ampicillin                     R Cefazolin                      R Cefepime                       I Ceftriaxone                    R Cefuroxime                     R Ciprofloxacin                  R Ertapenem                      S Gentamicin                     S Imipenem                       S Levofloxacin                   R Meropenem                      S Nitrofurantoin                 S Piperacillin/Tazobactam        S Tetracycline                   S Tobramycin                     S Trimethoprim/Sulfa             S   Urine culture     Status: None   Collection Time: 12/08/17  8:39 PM  Result Value Ref Range Status   Specimen Description   Final    URINE, CATHETERIZED Performed at [facility], [address] [place], [place] [address]    Special Requests   Final    NONE Performed at [facility], [address] [place], [place] [address]    Culture   Final    NO GROWTH Performed at [facility], [place] [address], [place], [place] [address]    Report Status 12/10/2017 FINAL  Final  C difficile quick scan w PCR reflex     Status: Abnormal   Collection Time: 12/10/17  2:33 AM  Result Value Ref Range Status   C Diff antigen POSITIVE (A) NEGATIVE Final   C Diff toxin NEGATIVE NEGATIVE Final   C Diff interpretation Results are indeterminate. See PCR results.  Final    Comment: Performed at [facility], [address] [place], [place] [address]  Gastrointestinal Panel by PCR , Stool     Status: None   Collection Time: 12/10/17  2:33 AM  Result Value Ref Range Status   Campylobacter species NOT DETECTED NOT DETECTED Final   Plesimonas shigelloides NOT DETECTED NOT DETECTED Final   Salmonella species NOT DETECTED NOT DETECTED Final   Yersinia enterocolitica NOT DETECTED NOT DETECTED Final   Vibrio species NOT DETECTED NOT DETECTED Final   Vibrio cholerae NOT DETECTED NOT DETECTED Final    Enteroaggregative E coli (EAEC) NOT DETECTED NOT DETECTED Final   Enteropathogenic E coli (EPEC) NOT DETECTED NOT DETECTED Final   Enterotoxigenic E coli (ETEC) NOT DETECTED NOT DETECTED Final   Shiga like toxin producing E coli (STEC) NOT DETECTED NOT DETECTED Final   Shigella/Enteroinvasive E coli (EIEC) NOT DETECTED NOT DETECTED Final   Cryptosporidium NOT DETECTED NOT DETECTED Final   Cyclospora cayetanensis NOT DETECTED NOT DETECTED Final   Entamoeba histolytica NOT DETECTED NOT DETECTED Final   Giardia lamblia NOT DETECTED NOT DETECTED Final   Adenovirus F40/41 NOT DETECTED NOT DETECTED Final   Astrovirus NOT DETECTED NOT DETECTED Final   Norovirus GI/GII NOT DETECTED NOT DETECTED Final   Rotavirus A NOT DETECTED NOT DETECTED Final   Sapovirus (I, II, IV, and V) NOT DETECTED NOT DETECTED Final    Comment: Performed at [facility], [address] [place], [place] [address]  C. Diff by PCR, Reflexed     Status: Abnormal   Collection Time: 12/10/17  2:33 AM  Result Value Ref Range Status   Toxigenic C. Difficile by PCR POSITIVE (A) NEGATIVE Final    Comment: Positive for toxigenic C. difficile with little to no toxin production. Only treat if clinical presentation suggests symptomatic illness. Performed at [facility], [address] [place], [place] [address]      Studies: Ct Guided Needle Placement  Result Date: 12/12/2017 INDICATION: Anterior mediastinal mass EXAM: CT-GUIDED ANTERIOR MEDIASTINAL BIOPSY MEDICATIONS: None. ANESTHESIA/SEDATION: None FLUOROSCOPY TIME:  Not applicable COMPLICATIONS: None immediate. PROCEDURE: Informed written consent was obtained from the patient and her family after a thorough discussion of the procedural risks, benefits and alternatives. All questions were addressed. Maximal Sterile Barrier Technique was utilized including caps, mask, sterile gowns, sterile gloves, sterile drape, hand hygiene and skin antiseptic. A timeout was  performed prior to the initiation of the procedure. Utilizing 1% xylocaine as local anesthetic and CT fluoroscopic guidance a 17 gauge guiding needle was placed adjacent to the sternum to the right of the midline into the known anterior mediastinal mass. Multiple 18 gauge core biopsies were then obtained. Guiding needle was then removed. Hemostasis was obtained at the puncture site. The patient tolerated the procedure well and was returned to her room in satisfactory condition. IMPRESSION: Successful CT-guided anterior mediastinal mass biopsy as described. Electronically Signed   By: Elta Guadeloupe  Lukens M.D.   On: 12/12/2017 14:47    Scheduled Meds: . amoxicillin-clavulanate  1 tablet Oral BID  . atorvastatin  20 mg Oral QHS  . diclofenac sodium  2 g Topical QID  . heparin injection (subcutaneous)  5,000 Units Subcutaneous Q8H  . insulin aspart  0-9 Units Subcutaneous TID WC  . memantine  5 mg Oral BID  . pantoprazole  40 mg Oral Daily  . propranolol  20 mg Oral TID  . saccharomyces boulardii  250 mg Oral BID  . vancomycin  125 mg Oral Q6H   Continuous Infusions: . sodium chloride      Assessment/Plan:  1. Clostridium difficile colitis.  Continue oral vancomycin.  Patient will need 14 days of p.o. vancomycin after stopping antibiotics for urinary tract infection. 2. Recurrent urinary tract infection.  E. coli.  Antibiotics changed to Augmentin. 3. Acute kidney injury.  Improved with IV fluid hydration. 4. Hyponatremia.  Patient's sodium still little low at 133. 5. Chronic migraine.  Started propranolol.  6. Hyperlipidemia unspecified on atorvastatin 7. Mediastinal mass.  Interventional radiology biopsy of this mediastinal mass done today 8. Weakness.  Physical therapy recommended rehab 9. Right knee pain secondary to bone-on-bone. 10. Delirium secondary to Ativan.  Try trazodone at night   Code Status:     Code Status Orders  (From admission, onward)        Start     Ordered    12/09/17 0120  Do not attempt resuscitation (DNR)  Continuous    Question Answer Comment  In the event of cardiac or respiratory ARREST Do not call a "code blue"   In the event of cardiac or respiratory ARREST Do not perform Intubation, CPR, defibrillation or ACLS   In the event of cardiac or respiratory ARREST Use medication by any route, position, wound care, and other measures to relive pain and suffering. May use oxygen, suction and manual treatment of airway obstruction as needed for comfort.      12/09/17 0119    Code Status History    Date Active Date Inactive Code Status Order ID Comments User Context   09/18/2017 00:54 09/20/2017 15:13 DNR [id number]  Gorden Harms, MD Inpatient   06/27/2017 20:10 06/29/2017 21:18 Full Code [id number]  Epifanio Lesches, MD ED   05/01/2017 01:20 05/02/2017 14:46 Full Code [id number]  Harvie Bridge, DO Inpatient   04/13/2016 21:15 04/17/2016 18:30 Full Code [id number]  Henreitta Leber, MD Inpatient     Family Communication: Daughter yesterday Disposition Plan: Will need rehab potentially Tuesday  if diarrhea not too bad.  Antibiotics:  Oral vancomycin  Oral Augmentin  Time spent: 24 minutes  [place]

## 2017-12-12 NOTE — Progress Notes (Signed)
MEDICATION-RELATED CONSULT NOTE   IR Procedure Consult - Anticoagulant/Antiplatelet PTA/Inpatient Med List Review by Pharmacist    Procedure: mediastinal biopsy    Completed: 1419  Post-Procedural bleeding risk per IR MD assessment: low    Antithrombotic medications on inpatient or PTA profile prior to procedure:   SQH    Recommended restart time per IR Post-Procedure Guidelines:  4-6 hours   Other considerations:      Plan:    Will resume SQH this PM.   Ulice Dash, PharmD Clinical Pharmacist

## 2017-12-13 ENCOUNTER — Encounter
Admission: RE | Admit: 2017-12-13 | Discharge: 2017-12-13 | Disposition: A | Discharge status: Home / Self Care | Payer: Medicare Other | Source: Ambulatory Visit | Attending: Internal Medicine | Admitting: Internal Medicine

## 2017-12-13 LAB — BASIC METABOLIC PANEL
ANION GAP: 8 (ref 5–15)
BUN: 17 mg/dL (ref 6–20)
CO2: 20 mmol/L — AB (ref 22–32)
CREATININE: 0.98 mg/dL (ref 0.44–1.00)
Calcium: 8.3 mg/dL — ABNORMAL LOW (ref 8.9–10.3)
Chloride: 105 mmol/L (ref 101–111)
GFR calc non Af Amer: 50 mL/min — ABNORMAL LOW (ref >60)
GFR, EST AFRICAN AMERICAN: 58 mL/min — AB (ref >60)
Glucose, Bld: 83 mg/dL (ref 65–99)
POTASSIUM: 4.1 mmol/L (ref 3.5–5.1)
SODIUM: 133 mmol/L — AB (ref 135–145)

## 2017-12-13 LAB — GLUCOSE, CAPILLARY
GLUCOSE-CAPILLARY: 91 mg/dL (ref 65–99)
GLUCOSE-CAPILLARY: 118 mg/dL — AB (ref 65–99)

## 2017-12-13 MED ORDER — TRAZODONE HCL 50 MG PO TABS: 25.0000 mg | ORAL_TABLET | Freq: Every evening | ORAL | 0 refills | Status: DC | PRN

## 2017-12-13 MED ORDER — TRAMADOL HCL 50 MG PO TABS: 50.0000 mg | ORAL_TABLET | Freq: Four times a day (QID) | ORAL | 0 refills | Status: DC | PRN

## 2017-12-13 MED ORDER — VANCOMYCIN 50 MG/ML ORAL SOLUTION: 125.0000 mg | Freq: Four times a day (QID) | ORAL | 0 refills | Status: DC

## 2017-12-13 MED ORDER — AMOXICILLIN-POT CLAVULANATE 875-125 MG PO TABS: 1.0000 | ORAL_TABLET | Freq: Two times a day (BID) | ORAL | 0 refills | Status: AC

## 2017-12-13 MED ORDER — FUROSEMIDE 10 MG/ML IJ SOLN: 20.0000 mg | Freq: Once | INTRAMUSCULAR | Status: DC

## 2017-12-13 MED ORDER — DICLOFENAC SODIUM 1 % TD GEL: 2.0000 g | Freq: Four times a day (QID) | TRANSDERMAL | 0 refills | Status: DC

## 2017-12-13 MED ORDER — HEPARIN SOD (PORK) LOCK FLUSH 100 UNIT/ML IV SOLN
500.0000 [IU] | INTRAVENOUS | Status: AC | PRN
Start: 2017-12-13 — End: 2017-12-13
  Administered 2017-12-13: 500 [IU]

## 2017-12-13 MED ORDER — SACCHAROMYCES BOULARDII 250 MG PO CAPS: 250.0000 mg | ORAL_CAPSULE | Freq: Two times a day (BID) | ORAL | 0 refills | Status: DC

## 2017-12-13 MED ORDER — FAMOTIDINE 20 MG PO TABS: 20.0000 mg | ORAL_TABLET | Freq: Every day | ORAL | 0 refills | Status: AC

## 2017-12-13 MED ORDER — PROPRANOLOL HCL 20 MG PO TABS: 20.0000 mg | ORAL_TABLET | Freq: Three times a day (TID) | ORAL | 0 refills | Status: DC

## 2017-12-13 NOTE — Progress Notes (Signed)
Called report to [facility].  Called EMS.    Patient to be transferred via EMS.  Daughter at bedside.

## 2017-12-13 NOTE — Clinical Social Work Placement (Signed)
   CLINICAL SOCIAL WORK PLACEMENT  NOTE  Date:  12/13/2017  Patient Details  Name: Cindy Sullivan MRN: [id number] Date of Birth: Apr 13, 1929  Clinical Social Work is seeking post-discharge placement for this patient at the [place] level of care (*CSW will initial, date and re-position this form in  chart as items are completed):  Yes   Patient/family provided with [facility] Work Department's list of facilities offering this level of care within the geographic area requested by the patient (or if unable, by the patient's family).  Yes   Patient/family informed of their freedom to choose among providers that offer the needed level of care, that participate in Medicare, Medicaid or managed care program needed by the patient, have an available bed and are willing to accept the patient.  Yes   Patient/family informed of [facility]'s ownership interest in [facility] and [facility], as well as of the fact that they are under no obligation to receive care at these facilities.  PASRR submitted to EDS on       PASRR number received on       Existing PASRR number confirmed on 12/11/17     FL2 transmitted to all facilities in geographic area requested by pt/family on 12/11/17     FL2 transmitted to all facilities within larger geographic area on       Patient informed that his/her managed care company has contracts with or will negotiate with certain facilities, including the following:        Yes   Patient/family informed of bed offers received.  Patient chooses bed at [facility]     Physician recommends and patient chooses bed at      Patient to be transferred to [facility] on 12/13/17.  Patient to be transferred to facility by [facility]     Patient family notified on 12/13/17 of transfer.  Name of family member notified:  Patient's daughter Tye [place] is at bedside and is aware that she will be discharging today.      PHYSICIAN Please sign DNR, Please sign FL2     Additional Comment:    _______________________________________________ Ross Ludwig, LCSWA 12/13/2017, 12:28 PM

## 2017-12-13 NOTE — Care Management Important Message (Signed)
Important Message  Patient Details  Name: TABBITHA JANVRIN MRN: [id number] Date of Birth: 1929-05-10   Medicare Important Message Given:  Yes Signed IM notice given to daughter    Katrina Stack, RN 12/13/2017, 3:01 PM

## 2017-12-13 NOTE — Discharge Summary (Signed)
[facility] NAME: Sanah Kraska    MR#:  [id number]  DATE OF BIRTH:  [birth year]  DATE OF ADMISSION:  12/08/2017 ADMITTING PHYSICIAN: Amelia Jo, MD  DATE OF DISCHARGE: 12/13/2017  PRIMARY CARE PHYSICIAN: Glendon Axe, MD    ADMISSION DIAGNOSIS:  Acute renal insufficiency [N28.9] Elevated troponin [R74.8] Cardiac mass [I51.89] Acute cystitis without hematuria [N30.00] Diarrhea, unspecified type [R19.7]  DISCHARGE DIAGNOSIS:  Active Problems:   Acute colitis   SECONDARY DIAGNOSIS:   Past Medical History:  Diagnosis Date  . Chest pain syndrome   . Chronic back pain   . Chronic diarrhea   . GERD (gastroesophageal reflux disease)   . H/O: GI bleed   . Hyperlipidemia   . Hypertension   . IDA (iron deficiency anemia)   . Insomnia   . Non-insulin dependent type 2 diabetes mellitus ([place])   . Pancreatitis     HOSPITAL COURSE:   1.  Clostridium difficile colitis.  The patient is on oral vancomycin here.  In the hospital we have the liquid formulation.  I did write the prescription 125 mg liquid formulation 4 times a day for 17 more days.  If you have the oral pill formulation that is fine to substitute if you do not have the liquid form.  Probiotics also ordered.  I stopped omeprazole secondary to C. difficile colitis. 2.  Recurrent urinary tract infection with E. coli.  Continue Augmentin for 5 more doses. 3.  Mediastinal mass.  Interventional radiology did a biopsy yesterday.  Results are still pending at this point.  Family wanted to know what it was.  Not sure if they want treatment for it if it comes back cancerous. 4.  Acute kidney injury.  Improved with IV fluid hydration 5.  Hyponatremia.  Patient's sodium better at 133 6.  Chronic migraine.  We started propranolol 7.  Hyperlipidemia unspecified on atorvastatin 9.  Right knee pain secondary to bone-on-bone.  Diclofenac topically ordered.  PRN Ultram 10.  Delirium the  other day secondary to Ativan.  Continue trazodone at night. 11.  Weakness.  Physical therapy recommends rehab 12.  Patient is a DNR.  Consider palliative care consultation at facility. 13.  GERD.  I stopped omeprazole secondary to C. difficile colitis.  Can use Pepcid at night instead. 14.  History of diabetes.  Sugars are all been on the lower side.  No need for insulin at this point.  Recommend watching sugars at this point with a daily fingerstick.   DISCHARGE CONDITIONS:   Fair  CONSULTS OBTAINED:  None  DRUG ALLERGIES:   Allergies  Allergen Reactions  . Ciprofloxacin Hives  . Codeine Other (See Comments)    Other reaction(s): Headache Reaction:  Headaches   . Escitalopram Oxalate     Other reaction(s): Other (See Comments) Agitation  . Metronidazole Nausea Only  . Other     Other reaction(s): Unknown anti- Inflammatory and bee stings  . Pantoprazole Nausea Only  . Phenergan [Promethazine Hcl] Other (See Comments)    Reaction:  Unknown   . Sulfamethoxazole-Trimethoprim     Other reaction(s): Unknown    DISCHARGE MEDICATIONS:   Allergies as of 12/13/2017      Reactions   Ciprofloxacin Hives   Codeine Other (See Comments)   Other reaction(s): Headache Reaction:  Headaches    Escitalopram Oxalate    Other reaction(s): Other (See Comments) Agitation   Metronidazole Nausea Only   Other    Other  reaction(s): Unknown anti- Inflammatory and bee stings   Pantoprazole Nausea Only   Phenergan [promethazine Hcl] Other (See Comments)   Reaction:  Unknown    Sulfamethoxazole-trimethoprim    Other reaction(s): Unknown      Medication List    STOP taking these medications   conjugated estrogens vaginal cream Commonly known as:  PREMARIN   insulin aspart protamine- aspart (70-30) 100 UNIT/ML injection Commonly known as:  NOVOLOG MIX 70/30   insulin regular 100 units/mL injection Commonly known as:  NOVOLIN R,HUMULIN R   loperamide 2 MG capsule Commonly  known as:  IMODIUM   omeprazole 40 MG capsule Commonly known as:  PRILOSEC   oxybutynin 5 MG 24 hr tablet Commonly known as:  DITROPAN-XL   polyethylene glycol packet Commonly known as:  MIRALAX / GLYCOLAX   psyllium 95 % Pack Commonly known as:  HYDROCIL/METAMUCIL   tiZANidine 2 MG tablet Commonly known as:  ZANAFLEX     TAKE these medications   acetaminophen 500 MG tablet Commonly known as:  TYLENOL Take 500 mg by mouth 2 (two) times daily.   amoxicillin-clavulanate 875-125 MG tablet Commonly known as:  AUGMENTIN Take 1 tablet by mouth 2 (two) times daily for 5 doses.   atorvastatin 20 MG tablet Commonly known as:  LIPITOR Take 20 mg by mouth at bedtime.   diclofenac sodium 1 % Gel Commonly known as:  VOLTAREN Apply 2 g topically 4 (four) times daily. To right knee   famotidine 20 MG tablet Commonly known as:  PEPCID Take 1 tablet (20 mg total) by mouth at bedtime.   memantine 5 MG tablet Commonly known as:  NAMENDA Take 5 mg by mouth 2 (two) times daily.   propranolol 20 MG tablet Commonly known as:  INDERAL Take 1 tablet (20 mg total) by mouth 3 (three) times daily.   saccharomyces boulardii 250 MG capsule Commonly known as:  FLORASTOR Take 1 capsule (250 mg total) by mouth 2 (two) times daily.   traMADol 50 MG tablet Commonly known as:  ULTRAM Take 1 tablet (50 mg total) by mouth every 6 (six) hours as needed for moderate pain.   traZODone 50 MG tablet Commonly known as:  DESYREL Take 0.5 tablets (25 mg total) by mouth at bedtime as needed for sleep.   vancomycin 50 mg/mL oral solution Commonly known as:  VANCOCIN Take 2.5 mLs (125 mg total) by mouth every 6 (six) hours for 17 days.        DISCHARGE INSTRUCTIONS:   Follow-up with team at rehab in 1 day  If you experience worsening of your admission symptoms, develop shortness of breath, life threatening emergency, suicidal or homicidal thoughts you must seek medical attention immediately by  calling 911 or calling your MD immediately  if symptoms less severe.  You Must read complete instructions/literature along with all the possible adverse reactions/side effects for all the Medicines you take and that have been prescribed to you. Take any new Medicines after you have completely understood and accept all the possible adverse reactions/side effects.   Please note  You were cared for by a hospitalist during your hospital stay. If you have any questions about your discharge medications or the care you received while you were in the hospital after you are discharged, you can call the unit and asked to speak with the hospitalist on call if the hospitalist that took care of you is not available. Once you are discharged, your primary care physician will handle any further  medical issues. Please note that NO REFILLS for any discharge medications will be authorized once you are discharged, as it is imperative that you return to your primary care physician (or establish a relationship with a primary care physician if you do not have one) for your aftercare needs so that they can reassess your need for medications and monitor your lab values.    Today   CHIEF COMPLAINT:   Chief Complaint  Patient presents with  . Diarrhea    HISTORY OF PRESENT ILLNESS:  Diavian Furgason  is a 84 y.o. female presented with diarrhea   VITAL SIGNS:  Blood pressure 128/76, pulse 92, temperature 98.1 F (36.7 C), temperature source Oral, resp. rate 18, height 4\' 9"  (1.448 m), weight 53.1 kg (117 lb), SpO2 97 %.   PHYSICAL EXAMINATION:  GENERAL:  84 y.o.-year-old patient lying in the bed with no acute distress.  EYES: Pupils equal, round, reactive to light and accommodation. No scleral icterus. Extraocular muscles intact.  HEENT: Head atraumatic, normocephalic. Oropharynx and nasopharynx clear.  NECK:  Supple, no jugular venous distention. No thyroid enlargement, no tenderness.  LUNGS: Decreased breath sounds  bilaterally, no wheezing, rales,rhonchi or crepitation. No use of accessory muscles of respiration.  CARDIOVASCULAR: S1, S2 normal. No murmurs, rubs, or gallops.  ABDOMEN: Soft, non-tender, non-distended. Bowel sounds present. No organomegaly or mass.  EXTREMITIES: Trace edema, no cyanosis, or clubbing.  NEUROLOGIC: Cranial nerves II through XII are intact. Muscle strength 5/5 in all extremities. Sensation intact. Gait not checked.  PSYCHIATRIC: The patient is alert and answers questions.  SKIN: Chronic lower extremity discoloration  DATA REVIEW:   CBC Recent Labs  Lab 12/09/17 1029  WBC 13.6*  HGB 11.6*  HCT 35.5  PLT 372    Chemistries  Recent Labs  Lab 12/10/17 1606  12/13/17 0500  NA 132*   < > 133*  K 4.6   < > 4.1  CL 107   < > 105  CO2 18*   < > 20*  GLUCOSE 152*   < > 83  BUN 24*   < > 17  CREATININE 0.93   < > 0.98  CALCIUM 8.1*   < > 8.3*  AST 32  --   --   ALT 20  --   --   ALKPHOS 90  --   --   BILITOT 0.6  --   --    < > = values in this interval not displayed.    Cardiac Enzymes Recent Labs  Lab 12/09/17 1029  TROPONINI 0.08*    Microbiology Results  Results for orders placed or performed during the hospital encounter of 12/08/17  Urine culture     Status: None   Collection Time: 12/08/17  8:39 PM  Result Value Ref Range Status   Specimen Description   Final    URINE, CATHETERIZED Performed at [facility], [address], [place], [place] [address]    Special Requests   Final    NONE Performed at [facility], [address] [place], [place] [address]    Culture   Final    NO GROWTH Performed at [facility], [place] [address], [place], [place] [address]    Report Status 12/10/2017 FINAL  Final  C difficile quick scan w PCR reflex     Status: Abnormal   Collection Time: 12/10/17  2:33 AM  Result Value Ref Range Status   C Diff antigen POSITIVE (A) NEGATIVE Final  C Diff toxin NEGATIVE NEGATIVE Final   C Diff  interpretation Results are indeterminate. See PCR results.  Final    Comment: Performed at [facility], [address] [place], [place] [address]  Gastrointestinal Panel by PCR , Stool     Status: None   Collection Time: 12/10/17  2:33 AM  Result Value Ref Range Status   Campylobacter species NOT DETECTED NOT DETECTED Final   Plesimonas shigelloides NOT DETECTED NOT DETECTED Final   Salmonella species NOT DETECTED NOT DETECTED Final   Yersinia enterocolitica NOT DETECTED NOT DETECTED Final   Vibrio species NOT DETECTED NOT DETECTED Final   Vibrio cholerae NOT DETECTED NOT DETECTED Final   Enteroaggregative E coli (EAEC) NOT DETECTED NOT DETECTED Final   Enteropathogenic E coli (EPEC) NOT DETECTED NOT DETECTED Final   Enterotoxigenic E coli (ETEC) NOT DETECTED NOT DETECTED Final   Shiga like toxin producing E coli (STEC) NOT DETECTED NOT DETECTED Final   Shigella/Enteroinvasive E coli (EIEC) NOT DETECTED NOT DETECTED Final   Cryptosporidium NOT DETECTED NOT DETECTED Final   Cyclospora cayetanensis NOT DETECTED NOT DETECTED Final   Entamoeba histolytica NOT DETECTED NOT DETECTED Final   Giardia lamblia NOT DETECTED NOT DETECTED Final   Adenovirus F40/41 NOT DETECTED NOT DETECTED Final   Astrovirus NOT DETECTED NOT DETECTED Final   Norovirus GI/GII NOT DETECTED NOT DETECTED Final   Rotavirus A NOT DETECTED NOT DETECTED Final   Sapovirus (I, II, IV, and V) NOT DETECTED NOT DETECTED Final    Comment: Performed at [facility], [address] [place], [place] [address]  C. Diff by PCR, Reflexed     Status: Abnormal   Collection Time: 12/10/17  2:33 AM  Result Value Ref Range Status   Toxigenic C. Difficile by PCR POSITIVE (A) NEGATIVE Final    Comment: Positive for toxigenic C. difficile with little to no toxin production. Only treat if clinical presentation suggests symptomatic illness. Performed at [facility], [address] [place], [place] [address]      RADIOLOGY:  Ct Guided Needle Placement  Result Date: 12/12/2017 INDICATION: Anterior mediastinal mass EXAM: CT-GUIDED ANTERIOR MEDIASTINAL BIOPSY MEDICATIONS: None. ANESTHESIA/SEDATION: None FLUOROSCOPY TIME:  Not applicable COMPLICATIONS: None immediate. PROCEDURE: Informed written consent was obtained from the patient and her family after a thorough discussion of the procedural risks, benefits and alternatives. All questions were addressed. Maximal Sterile Barrier Technique was utilized including caps, mask, sterile gowns, sterile gloves, sterile drape, hand hygiene and skin antiseptic. A timeout was performed prior to the initiation of the procedure. Utilizing 1% xylocaine as local anesthetic and CT fluoroscopic guidance a 17 gauge guiding needle was placed adjacent to the sternum to the right of the midline into the known anterior mediastinal mass. Multiple 18 gauge core biopsies were then obtained. Guiding needle was then removed. Hemostasis was obtained at the puncture site. The patient tolerated the procedure well and was returned to her room in satisfactory condition. IMPRESSION: Successful CT-guided anterior mediastinal mass biopsy as described. Electronically Signed   By: Inez Catalina M.D.   On: 12/12/2017 14:47      Management plans discussed with the patient, family and they are in agreement.  CODE STATUS:     Code Status Orders  (From admission, onward)        Start     Ordered   12/09/17 0120  Do not attempt resuscitation (DNR)  Continuous    Question Answer Comment  In the event of cardiac or  respiratory ARREST Do not call a "code blue"   In the event of cardiac or respiratory ARREST Do not perform Intubation, CPR, defibrillation or ACLS   In the event of cardiac or respiratory ARREST Use medication by any route, position, wound care, and other measures to relive pain and suffering. May use oxygen, suction and manual treatment of airway obstruction as needed for comfort.       12/09/17 0119    Code Status History    Date Active Date Inactive Code Status Order ID Comments User Context   09/18/2017 00:54 09/20/2017 15:13 DNR [id number]  Gorden Harms, MD Inpatient   06/27/2017 20:10 06/29/2017 21:18 Full Code [id number]  Epifanio Lesches, MD ED   05/01/2017 01:20 05/02/2017 14:46 Full Code [id number]  Harvie Bridge, DO Inpatient   04/13/2016 21:15 04/17/2016 18:30 Full Code [id number]  Henreitta Leber, MD Inpatient      TOTAL TIME TAKING CARE OF THIS PATIENT: 35 minutes.    Loletha Grayer M.D on 12/13/2017 at 9:59 AM  Between 7am to 6pm - Pager - [phone]  After 6pm go to [email or web address] - password [email or web address] [facility]  [phone]  CC: Primary care physician; Glendon Axe, MD

## 2017-12-13 NOTE — Clinical Social Work Note (Signed)
CSW presented bed offers to patient and her daughter and they chose [facility].  CSW contacted [facility] and they can accept patient today.  Patient to be d/c'ed today to [facility]. Patient and family agreeable to plans will transport via ems RN to call report to [phone].  Evette Cristal, MSW, [place]

## 2017-12-14 ENCOUNTER — Other Ambulatory Visit
Admission: RE | Admit: 2017-12-14 | Discharge: 2017-12-14 | Disposition: A | Discharge status: Home / Self Care | Payer: Medicare Other | Source: Skilled Nursing Facility | Attending: Internal Medicine | Admitting: Internal Medicine

## 2017-12-14 ENCOUNTER — Other Ambulatory Visit: Payer: Self-pay

## 2017-12-14 DIAGNOSIS — E871 Hypo-osmolality and hyponatremia: Secondary | ICD-10-CM | POA: Diagnosis present

## 2017-12-14 LAB — CBC WITH DIFFERENTIAL/PLATELET
BASOS PCT: 1 %
Basophils Absolute: 0.1 10*3/uL (ref 0–0.1)
EOS ABS: 0.5 10*3/uL (ref 0–0.7)
Eosinophils Relative: 4 %
HCT: 34.6 % — ABNORMAL LOW (ref 35.0–47.0)
Hemoglobin: 11.2 g/dL — ABNORMAL LOW (ref 12.0–16.0)
Lymphocytes Relative: 20 %
Lymphs Abs: 2.5 10*3/uL (ref 1.0–3.6)
MCH: 27.2 pg (ref 26.0–34.0)
MCHC: 32.4 g/dL (ref 32.0–36.0)
MCV: 84 fL (ref 80.0–100.0)
MONO ABS: 2 10*3/uL — AB (ref 0.2–0.9)
Monocytes Relative: 16 %
NEUTROS PCT: 59 %
Neutro Abs: 7.4 10*3/uL — ABNORMAL HIGH (ref 1.4–6.5)
Platelets: 511 10*3/uL — ABNORMAL HIGH (ref 150–440)
RBC: 4.11 MIL/uL (ref 3.80–5.20)
RDW: 15.1 % — ABNORMAL HIGH (ref 11.5–14.5)
WBC: 12.5 10*3/uL — ABNORMAL HIGH (ref 3.6–11.0)

## 2017-12-14 LAB — COMPREHENSIVE METABOLIC PANEL
ALT: 12 U/L — AB (ref 14–54)
ANION GAP: 9 (ref 5–15)
AST: 24 U/L (ref 15–41)
Albumin: 2.3 g/dL — ABNORMAL LOW (ref 3.5–5.0)
Alkaline Phosphatase: 65 U/L (ref 38–126)
BUN: 15 mg/dL (ref 6–20)
CO2: 20 mmol/L — AB (ref 22–32)
Calcium: 8.4 mg/dL — ABNORMAL LOW (ref 8.9–10.3)
Chloride: 103 mmol/L (ref 101–111)
Creatinine, Ser: 0.83 mg/dL (ref 0.44–1.00)
GFR calc non Af Amer: >60 mL/min (ref >60)
Glucose, Bld: 87 mg/dL (ref 65–99)
POTASSIUM: 4.4 mmol/L (ref 3.5–5.1)
SODIUM: 132 mmol/L — AB (ref 135–145)
Total Bilirubin: 0.7 mg/dL (ref 0.3–1.2)
Total Protein: 6.2 g/dL — ABNORMAL LOW (ref 6.5–8.1)

## 2017-12-14 MED ORDER — TRAMADOL HCL 50 MG PO TABS: 50.0000 mg | ORAL_TABLET | Freq: Four times a day (QID) | ORAL | 0 refills | Status: DC | PRN

## 2017-12-14 NOTE — Telephone Encounter (Signed)
Rx sent to [facility] phone : [phone] , fax : [phone]  

## 2017-12-19 ENCOUNTER — Other Ambulatory Visit: Payer: Self-pay | Admitting: Pathology

## 2017-12-19 ENCOUNTER — Inpatient Hospital Stay: Payer: Medicare Other | Attending: Internal Medicine

## 2017-12-21 LAB — SURGICAL PATHOLOGY

## 2017-12-22 ENCOUNTER — Telehealth: Payer: Self-pay | Admitting: Internal Medicine

## 2017-12-22 NOTE — Telephone Encounter (Signed)
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  Spoke to pt's brother- currently in [place]; weak/poor PS; family decided to keep pt comfortable- and not plan chemo/RT etc. ;and move to [place]. He states he will talk to rest of the family.   If they changed their mind they will let me know re: treatment options. Cancel all appt for now.

## 2017-12-23 ENCOUNTER — Encounter
Admission: RE | Admit: 2017-12-23 | Discharge: 2017-12-23 | Disposition: A | Discharge status: Home / Self Care | Payer: Medicare Other | Source: Ambulatory Visit | Attending: Internal Medicine | Admitting: Internal Medicine

## 2017-12-23 ENCOUNTER — Telehealth: Payer: Self-pay | Admitting: Internal Medicine

## 2017-12-23 ENCOUNTER — Non-Acute Institutional Stay (SKILLED_NURSING_FACILITY): Payer: Medicare Other | Admitting: Gerontology

## 2017-12-23 ENCOUNTER — Encounter: Payer: Self-pay | Admitting: Gerontology

## 2017-12-23 DIAGNOSIS — A0472 Enterocolitis due to Clostridium difficile, not specified as recurrent: Secondary | ICD-10-CM | POA: Diagnosis not present

## 2017-12-23 DIAGNOSIS — I10 Essential (primary) hypertension: Secondary | ICD-10-CM

## 2017-12-23 DIAGNOSIS — G893 Neoplasm related pain (acute) (chronic): Secondary | ICD-10-CM

## 2017-12-23 NOTE — Telephone Encounter (Signed)
Spoke to pt's son- Cindy Sullivan re: palliative treatment options for his mother's lymphoma-including radiation prednisone Rituxan etc.  He will discuss with his family and inform us if they are interested in following up with [place].

## 2017-12-23 NOTE — Progress Notes (Addendum)
Location:   [facility] Room Number: [place] of Service:  SNF [id number]) Provider:  Toni Arthurs, NP-C  Glendon Axe, MD  Patient Care Team: Glendon Axe, MD as PCP - General (Internal Medicine)  Extended Emergency Contact Information Primary Emergency Contact: Dalbert [place] of [place] Phone: [phone] Relation: Brother Secondary Emergency Contact: Abran [place] of [place] Phone: [phone] Mobile Phone: [phone] Relation: Son  Code Status:  DNR Goals of care: Advanced Directive information Advanced Directives 12/23/2017  Does Patient Have a Medical Advance Directive? Yes  Type of Advance Directive Out of facility DNR (pink MOST or yellow form)  Does patient want to make changes to medical advance directive? No - Patient declined  Copy of [place] in Chart? -  Would patient like information on creating a medical advance directive? -     Chief Complaint  Patient presents with  . Medical Management of Chronic Issues    Routine Visit    HPI:  Pt is a 84 y.o. female seen today for medical management of chronic diseases.    Clostridium difficile colitis Stable/Improving. Pt continues of po Vancomycin 125 mg po QID and Florastor 250 mg po BID.Diarrhea improving. Pt remains on Enteric Precautions.   Essential (primary) hypertension Stable. No recent episodes of hyper or hypotension. Pt denies chest pain or shortness of breath. Symptoms controlled with Propranolol 20 mg po TID only.   Chronic pain due to neoplasm Stable/ improved. Pt reports pain is now managed/controlled with scheduled medications. Pt is on Scheduled Tylenol 500 mg po QID and 650 mg Q 4 hours prn, Tramadol 50 mg po QID and Q 6 hours prn and Robaxin 500 mg po Q 6 hours prn. She also receives the Lidocaine patch daily and Voltaren gel 1%- 4 grams to the knees QID.    Past Medical History:  Diagnosis Date  .  Anemia, unspecified   . Anxiety    unspecified  . Chest pain syndrome   . Chronic back pain   . Chronic diarrhea   . Depression    unspecified  . Diarrhea, unspecified   . Diverticulosis    with recurrent LGI bleeds.S/p xfusion 11/12.   . Essential hypertension, benign   . GERD (gastroesophageal reflux disease)   . Gout   . Headache   . Herniated nucleus pulposus, lumbar    low back, stable  . History of chicken pox   . History of GI bleed    x2 in 09/1997, and 01/2000, both due to diverticulosis  . Hyperlipidemia    other and unspecified  . Hypertension   . IDA (iron deficiency anemia)   . Insomnia   . Non-insulin dependent type 2 diabetes mellitus ([place])   . Pancreatitis   . Recurrent pancreatitis ([place])   . Sleep disturbance    chronic  . Type II diabetes mellitus (HCC)    Type II or unspecified type diabetes mellitus without mention of complication, not stated as uncontrolled (CMS-HCC)   Past Surgical History:  Procedure Laterality Date  . APPENDECTOMY    . CARDIAC CATHETERIZATION     x2 in 1991 and 01/2008  . CHOLECYSTECTOMY    . COLONOSCOPY     1992, 2001, 2012  . EXPLORATORY LAPAROTOMY     for lysis of adhesions  . FLEXIBLE SIGMOIDOSCOPY  02/06/1993  . HERNIA REPAIR     abdominal hernia repaired x2  . KNEE ARTHROSCOPY Left 1977  . ROTATOR  CUFF REPAIR  1998  . TOTAL ABDOMINAL HYSTERECTOMY W/ BILATERAL SALPINGOOPHORECTOMY     one ovary still left    Allergies  Allergen Reactions  . Ciprofloxacin Hives  . Codeine Other (See Comments)    Other reaction(s): Headache Reaction:  Headaches   . Escitalopram Oxalate     Other reaction(s): Other (See Comments) Agitation  . Metronidazole Nausea Only  . Other     Other reaction(s): Unknown anti- Inflammatory and bee stings  . Pantoprazole Nausea Only  . Phenergan [Promethazine Hcl] Other (See Comments)    Reaction:  Unknown   . Sulfamethoxazole-Trimethoprim     Other reaction(s): Unknown    Allergies as  of 12/23/2017      Reactions   Ciprofloxacin Hives   Codeine Other (See Comments)   Other reaction(s): Headache Reaction:  Headaches    Escitalopram Oxalate    Other reaction(s): Other (See Comments) Agitation   Metronidazole Nausea Only   Other    Other reaction(s): Unknown anti- Inflammatory and bee stings   Pantoprazole Nausea Only   Phenergan [promethazine Hcl] Other (See Comments)   Reaction:  Unknown    Sulfamethoxazole-trimethoprim    Other reaction(s): Unknown      Medication List        Accurate as of 12/23/17 12:16 PM. Always use your most recent med list.          acetaminophen 500 MG tablet Commonly known as:  TYLENOL Take 500 mg by mouth 2 (two) times daily.   acetaminophen 325 MG tablet Commonly known as:  TYLENOL Take 650 mg by mouth every 4 (four) hours as needed. for pain/ increased temp. May be administered orally, per G-tube if needed or rectally if unable to swallow (separate order). Maximum dose for 24 hours is 3,000 mg from all sources of Acetaminophen/ Tylenol   atorvastatin 20 MG tablet Commonly known as:  LIPITOR Take 20 mg by mouth at bedtime.   diclofenac sodium 1 % Gel Commonly known as:  VOLTAREN Apply 2 g topically 4 (four) times daily. To right knee   ENSURE ENLIVE PO Take 1 Bottle by mouth 2 (two) times daily between meals. low protein, low albumin   famotidine 20 MG tablet Commonly known as:  PEPCID Take 1 tablet (20 mg total) by mouth at bedtime.   feeding supplement (PRO-STAT SUGAR FREE 64) Liqd Take 30 mLs by mouth 2 (two) times daily between meals. low protein, low albumin   Lidocaine 4 % Ptch Apply 1 patch topically 2 (two) times daily. Apply to lower back   memantine 5 MG tablet Commonly known as:  NAMENDA Take 5 mg by mouth 2 (two) times daily.   propranolol 20 MG tablet Commonly known as:  INDERAL Take 1 tablet (20 mg total) by mouth 3 (three) times daily.   saccharomyces boulardii 250 MG capsule Commonly known  as:  FLORASTOR Take 1 capsule (250 mg total) by mouth 2 (two) times daily.   traMADol 50 MG tablet Commonly known as:  ULTRAM Take 1 tablet (50 mg total) by mouth every 6 (six) hours as needed for moderate pain.   traZODone 50 MG tablet Commonly known as:  DESYREL Take 0.5 tablets (25 mg total) by mouth at bedtime as needed for sleep.       Review of Systems  Constitutional: Negative for activity change, appetite change, chills, diaphoresis and fever.  HENT: Negative for congestion, mouth sores, nosebleeds, postnasal drip, sneezing, sore throat, trouble swallowing and voice change.  Respiratory: Negative for apnea, cough, choking, chest tightness, shortness of breath and wheezing.   Cardiovascular: Negative for chest pain, palpitations and leg swelling.  Gastrointestinal: Positive for diarrhea. Negative for abdominal distention, abdominal pain, constipation and nausea.  Genitourinary: Negative for difficulty urinating, dysuria, frequency and urgency.  Musculoskeletal: Positive for arthralgias (typical arthritis), back pain and gait problem. Negative for myalgias.  Skin: Negative for color change, pallor, rash and wound.  Neurological: Positive for weakness. Negative for dizziness, tremors, syncope, speech difficulty, numbness and headaches.  Psychiatric/Behavioral: Negative for agitation and behavioral problems.  All other systems reviewed and are negative.   Immunization History  Administered Date(s) Administered  . Influenza Inj Mdck Quad Pf 07/25/2017  . Influenza-Unspecified 07/25/2014, 08/02/2016, 07/25/2017  . Pneumococcal-Unspecified 07/25/2014   Pertinent  Health Maintenance Due  Topic Date Due  . FOOT EXAM  12/10/1938  . OPHTHALMOLOGY EXAM  12/10/1938  . URINE MICROALBUMIN  12/10/1938  . DEXA SCAN  12/11/1995  . PNA vac Low Risk Adult (1 of 2 - PCV13) 12/11/1995  . HEMOGLOBIN A1C  07/26/2012  . INFLUENZA VACCINE  Completed   No flowsheet data found. Functional  Status Survey:    Vitals:   12/23/17 1128  BP: (!) 170/84  Pulse: 93  Resp: 20  Temp: 97.6 F (36.4 C)  TempSrc: Oral  SpO2: 97%  Weight: 122 lb 6.4 oz (55.5 kg)  Height: 4\' 9"  (1.448 m)   Body mass index is 26.49 kg/m. Physical Exam  Constitutional: She is oriented to person, place, and time. Vital signs are normal. She appears well-developed and well-nourished. She is active and cooperative. She does not appear ill. No distress.  HENT:  Head: Normocephalic and atraumatic.  Mouth/Throat: Uvula is midline, oropharynx is clear and moist and mucous membranes are normal. Mucous membranes are not pale, not dry and not cyanotic.  Eyes: Pupils are equal, round, and reactive to light. Conjunctivae, EOM and lids are normal.  Neck: Trachea normal, normal range of motion and full passive range of motion without pain. Neck supple. No JVD present. No tracheal deviation, no edema and no erythema present. No thyromegaly present.  Cardiovascular: Normal rate, regular rhythm, normal heart sounds, intact distal pulses and normal pulses. Exam reveals no gallop, no distant heart sounds and no friction rub.  No murmur heard. Pulses:      Dorsalis pedis pulses are 2+ on the right side, and 2+ on the left side.  No edema  Pulmonary/Chest: Effort normal and breath sounds normal. No accessory muscle usage. No respiratory distress. She has no decreased breath sounds. She has no wheezes. She has no rhonchi. She has no rales. She exhibits no tenderness.  Abdominal: Soft. Normal appearance and bowel sounds are normal. She exhibits no distension and no ascites. There is no tenderness.  Musculoskeletal: Normal range of motion. She exhibits no edema or tenderness.  Expected osteoarthritis, stiffness; Bilateral Calves soft, supple. Negative Homan's Sign. B- pedal pulses equal; generalized weakness, mobile on the unit in wheelchair  Neurological: She is alert and oriented to person, place, and time. She has normal  strength. She exhibits abnormal muscle tone. Coordination and gait abnormal.  Skin: Skin is warm, dry and intact. She is not diaphoretic. No cyanosis. No pallor. Nails show no clubbing.  Psychiatric: She has a normal mood and affect. Her speech is normal and behavior is normal. Judgment and thought content normal. Cognition and memory are normal.  Nursing note and vitals reviewed.   Labs reviewed: Recent Labs  12/11/17 0711 12/13/17 0500 12/14/17 0510  NA 133* 133* 132*  K 4.1 4.1 4.4  CL 107 105 103  CO2 16* 20* 20*  GLUCOSE 136* 83 87  BUN 18 17 15   CREATININE 1.03* 0.98 0.83  CALCIUM 8.4* 8.3* 8.4*   Recent Labs    12/08/17 1825 12/10/17 1606 12/14/17 0510  AST 25 32 24  ALT 13* 20 12*  ALKPHOS 77 90 65  BILITOT 0.4 0.6 0.7  PROT 7.0 6.2* 6.2*  ALBUMIN 2.7* 2.2* 2.3*   Recent Labs    06/27/17 1658  09/11/17 1823  12/08/17 1825 12/09/17 1029 12/14/17 0510  WBC 22.0*   < > 12.4*   < > 16.0* 13.6* 12.5*  NEUTROABS 18.8*  --  6.8*  --   --   --  7.4*  HGB 13.1   < > 12.4   < > 10.8* 11.6* 11.2*  HCT 39.8   < > 37.5   < > 33.3* 35.5 34.6*  MCV 84.3   < > 83.6   < > 84.2 85.3 84.0  PLT 453*   < > 388   < > 398 372 511*   < > = values in this interval not displayed.   No results found for: TSH Lab Results  Component Value Date   HGBA1C 7.5 (H) 01/25/2012   Lab Results  Component Value Date   CHOL 106 11/29/2012   HDL 27 (L) 11/29/2012   LDLCALC 50 11/29/2012   TRIG 144 11/29/2012    Significant Diagnostic Results in last 30 days:  Ct Abdomen Pelvis Wo Contrast  Result Date: 12/08/2017 CLINICAL DATA:  Diarrhea earlier this morning. EXAM: CT ABDOMEN AND PELVIS WITHOUT CONTRAST TECHNIQUE: Multidetector CT imaging of the abdomen and pelvis was performed following the standard protocol without IV contrast. COMPARISON:  CT abdomen and pelvis from 04/30/2017 FINDINGS: Lower chest: Partially imaged 3.7 x 4.8 cm masslike abnormality along the ventral aspect of the  heart, epicenter of the mass along the pericardium. Dedicated CT of the chest for further evaluation is recommended. This does not have the typical appearance of a pericardial cyst or lipoma. Partially included lymphoma or possibly mesothelioma among some other solid pathology that may predispose to this area though not exclusive. This is not apparent on the comparison exam. Heart is borderline enlarged with left main and 3 vessel coronary arteriosclerosis. There is aortic atherosclerosis. Hepatobiliary: The unopacified liver demonstrates intrahepatic and extrahepatic ductal dilatation (to 21 mm currently) which can be seen in the prior setting of cholecystectomy. No choledocholithiasis. The patient is status post cholecystectomy. Calcified granuloma is noted in the right hepatic lobe. Pancreas: Atrophic without focal mass or ductal dilatation. Spleen: Normal Adrenals/Urinary Tract: Normal bilateral adrenal glands. No nephrolithiasis or hydronephrosis. Water attenuating 2.5 cm in diameter cyst is seen in the lower pole the left kidney unchanged in appearance. Smaller cysts are also noted of the left kidney as before. No hydroureteronephrosis. Urinary bladder is physiologically distended without calculus. Slight nodular thickening along the dependent and nondependent walls of the urinary bladder are seen. Direct visual correlation is recommended for further evaluation to exclude a flat transitional cell lesion versus focal inflammatory thickening. Stomach/Bowel: Nondistended stomach with normal small bowel rotation. Scattered colonic diverticulosis without acute diverticulitis. No bowel obstruction. Status post appendectomy by report Vascular/Lymphatic: Moderate aortoiliac atherosclerosis without aneurysm. No lymphadenopathy. Reproductive: Uterus is surgically absent. No adnexal mass is noted. Other: No free air nor free fluid. Musculoskeletal: Mild degenerative disc disease of the  thoracolumbar juncture. No acute  nor suspicious osseous lesions. IMPRESSION: 1. A partially included, 3.7 x 4.8 cm solid appearing masslike abnormality is noted along the ventral aspect of the heart along the pericardium. This does not have the typical appearance of a more common epicardial cyst or lipoma. Dedicated CT with IV contrast is recommended for further correlation. More common solid pathology in this region may include a mesothelioma or partial imaging of lymphoma among some possibilities. 2. Left main and three-vessel coronary arteriosclerosis. 3. Chronic dilatation of the intrahepatic and extrahepatic biliary system status post cholecystectomy. Choledocholithiasis. 4. Water attenuating cyst of the left lower pole of the kidney measuring 2.5 cm. 5. Eccentric mural thickening of the bladder along the dependent posterior wall. Direct visual correlation is recommended to exclude a transitional cell lesion versus inflammatory thickening or dependent debris. 6. Moderate aortoiliac atherosclerosis. 7. Colonic diverticulosis without acute diverticulitis. Electronically Signed   By: Ashley Royalty M.D.   On: 12/08/2017 20:26   Ct Head Wo Contrast  Result Date: 12/10/2017 CLINICAL DATA:  Severe headache. EXAM: CT HEAD WITHOUT CONTRAST TECHNIQUE: Contiguous axial images were obtained from the base of the skull through the vertex without intravenous contrast. COMPARISON:  Head CT 11/27/2012 and MRI 11/28/2012 FINDINGS: Brain: There is no evidence of acute infarct, intracranial hemorrhage, mass, midline shift, or extra-axial fluid collection. The ventricles and sulci are within normal limits for age. Chronic lacunar infarcts are present in the basal ganglia and thalami bilaterally, including an interval chronic lacunar in the right caudate. Patchy to confluent subcortical and periventricular white matter hypodensities have progressed and are nonspecific but compatible with moderate to severe chronic small vessel ischemic disease. Vascular:  Calcified atherosclerosis at the skull base. No hyperdense vessel. Skull: No fracture or focal osseous lesion. Sinuses/Orbits: Mild scattered paranasal sinus mucosal thickening. Trace left mastoid effusion. Unremarkable orbits. Other: None. IMPRESSION: 1. No evidence of acute intracranial abnormality. 2. Moderate to severe chronic small vessel ischemic disease, progressed from 2014. Electronically Signed   By: Logan Bores M.D.   On: 12/10/2017 14:02   Ct Chest W Contrast  Result Date: 12/10/2017 CLINICAL DATA:  Chest mass seen on prior CT abdomen and pelvis. EXAM: CT CHEST WITH CONTRAST TECHNIQUE: Multidetector CT imaging of the chest was performed during intravenous contrast administration. CONTRAST:  40mL ISOVUE-300 IOPAMIDOL (ISOVUE-300) INJECTION 61% COMPARISON:  12/08/2017 CT abdomen and pelvis, 04/30/2017 CXR FINDINGS: Cardiovascular: Heart is top-normal in size with trace pericardial effusion. Mitral annular calcifications are identified in addition to coronary arteriosclerosis. Aortic atherosclerosis without aneurysm or dissection. Catheter tip within the SVC terminates at the cavoatrial junction. Mediastinum/Nodes: Heterogeneously enhancing lobular anterior mediastinal mass with central areas of hypodensity suggesting necrosis is identified within the mediastinum and measures approximately 7.9 x 3.9 x 6.6 cm. There is positive mass effect on the right atrium. This mass abuts the ascending thoracic aorta, main pulmonary artery and left heart border causing slight positive mass effect. A 14 mm short axis right lower paratracheal lymph node is also present. Additional subdiaphragmatic mildly enlarged lymph nodes are seen on the right measuring up to 6 mm short axis. No supraclavicular adenopathy. No enlarged axillary lymph nodes. The esophagus, trachea and mainstem bronchi are unremarkable. Normal branch pattern of the great vessels with atherosclerosis. No thyromegaly or mass. Lungs/Pleura: Small right  greater than left pleural effusions with adjacent atelectasis. Faint nonspecific ground-glass opacities in both upper lobes and lingula. No dominant mass identified within the lungs. Upper Abdomen: Redemonstration of intra and extrahepatic  ductal dilatation. No adrenal mass. No splenomegaly. No acute upper abdominal abnormality. Musculoskeletal: No aggressive osseous lesions. Degenerative changes are noted along the dorsal spine. IMPRESSION: 1. Lobular anterior mediastinal mass with scattered areas of central hypodensity consistent with necrosis. Overall this mass measures 7.9 x 3.9 x 6.6 cm and causes slight positive mass effect on adjacent vascular and cardiac structures. Favor lymphoma, more likely non-Hodgkin's lymphoma given lack of significant mediastinal or hilar lymphadenopathy elsewhere apart from a solitary 14 mm short axis right lower paratracheal lymph node and a few mildly enlarged subdiaphragmatic lymph nodes in the right upper quadrant. Metastatic disease is also a possibility though believed less likely. This mass is not contiguous with the thyroid to suggest a substernal goiter. 2. Small bilateral pleural effusions right greater than left with atelectasis. A few faint ground-glass opacities in the upper lobes and lingula are nonspecific and may represent minimal postinfectious or postinflammatory change. Aortic Atherosclerosis (ICD10-I70.0). Electronically Signed   By: Ashley Royalty M.D.   On: 12/10/2017 17:55   Ct Guided Needle Placement  Result Date: 12/12/2017 INDICATION: Anterior mediastinal mass EXAM: CT-GUIDED ANTERIOR MEDIASTINAL BIOPSY MEDICATIONS: None. ANESTHESIA/SEDATION: None FLUOROSCOPY TIME:  Not applicable COMPLICATIONS: None immediate. PROCEDURE: Informed written consent was obtained from the patient and her family after a thorough discussion of the procedural risks, benefits and alternatives. All questions were addressed. Maximal Sterile Barrier Technique was utilized including  caps, mask, sterile gowns, sterile gloves, sterile drape, hand hygiene and skin antiseptic. A timeout was performed prior to the initiation of the procedure. Utilizing 1% xylocaine as local anesthetic and CT fluoroscopic guidance a 17 gauge guiding needle was placed adjacent to the sternum to the right of the midline into the known anterior mediastinal mass. Multiple 18 gauge core biopsies were then obtained. Guiding needle was then removed. Hemostasis was obtained at the puncture site. The patient tolerated the procedure well and was returned to her room in satisfactory condition. IMPRESSION: Successful CT-guided anterior mediastinal mass biopsy as described. Electronically Signed   By: Inez Catalina M.D.   On: 12/12/2017 14:47   US Pelvis (transabdominal Only)  Result Date: 12/09/2017 CLINICAL DATA:  Vaginal bleeding. EXAM: TRANSABDOMINAL ULTRASOUND OF PELVIS TECHNIQUE: Transabdominal ultrasound examination of the pelvis was performed including evaluation of the uterus, ovaries, adnexal regions, and pelvic cul-de-sac. COMPARISON:  Abdominal CT from yesterday. FINDINGS: Uterus Surgically absent.  Unremarkable cuff. Right ovary Not visualized.  Negative adnexa on preceding abdominal CT. Left ovary Not visualized. Negative adnexa on preceding abdominal CT. Other findings: There is posterior/inferior wall lobulated mural thickening concerning for a mass. This was also noted on previous CT. No visible extension of the mass into the vaginal cuff. IMPRESSION: 1. Hysterectomy with negative vaginal cuff. 2. Neither ovary could be visualized. The adnexa are negative on preceding abdominal CT. 3. Masslike thickening at the bladder base as noted on CT yesterday. Consider cystoscopy. Electronically Signed   By: Monte Fantasia M.D.   On: 12/09/2017 10:13    Assessment/Plan  Clostridium difficile colitis  Essential (primary) hypertension  Chronic pain due to neoplasm   Above listed conditions stable or  improving  Continue current medication regimen  Continue to monitor for worsening diarrhea or abdominal pain  Continue Enteric Precautions until recheck stool sample negative for C-Diff  Monitor for Hyper or Hypotension  Monitor for worsening pain  Continue Palliative Care services  Family/ staff Communication:    Total Time:  Documentation:  Face to Face:  Family/Phone:   Labs/tests ordered: recheck  stool culture at completion of treatment    Medication list reviewed and assessed for continued appropriateness. Monthly medication orders reviewed and signed.  Vikki Ports, NP-C [facility] [phone], [place] [address] Cell Phone (Mon-Fri 8am-5pm):  [phone] On Call:  [phone] & follow prompts after 5pm & weekends Office Phone:  [phone] Office Fax:  [phone]

## 2017-12-23 NOTE — Telephone Encounter (Signed)
md will call son

## 2017-12-26 ENCOUNTER — Other Ambulatory Visit
Admission: RE | Admit: 2017-12-26 | Discharge: 2017-12-26 | Disposition: A | Discharge status: Home / Self Care | Payer: Medicare Other | Source: Ambulatory Visit | Attending: Gerontology | Admitting: Gerontology

## 2017-12-26 ENCOUNTER — Other Ambulatory Visit: Payer: 59

## 2017-12-26 ENCOUNTER — Ambulatory Visit: Payer: 59 | Admitting: Internal Medicine

## 2017-12-26 DIAGNOSIS — A0472 Enterocolitis due to Clostridium difficile, not specified as recurrent: Secondary | ICD-10-CM | POA: Diagnosis present

## 2017-12-26 LAB — C DIFFICILE QUICK SCREEN W PCR REFLEX
C Diff antigen: POSITIVE — AB
C Diff toxin: NEGATIVE

## 2017-12-26 LAB — CLOSTRIDIUM DIFFICILE BY PCR, REFLEXED: Toxigenic C. Difficile by PCR: POSITIVE — AB

## 2017-12-28 ENCOUNTER — Other Ambulatory Visit: Payer: Self-pay

## 2017-12-28 MED ORDER — TRAMADOL HCL 50 MG PO TABS: 50.0000 mg | ORAL_TABLET | Freq: Four times a day (QID) | ORAL | 0 refills | Status: AC | PRN

## 2017-12-28 MED ORDER — TRAMADOL HCL 50 MG PO TABS: 50.0000 mg | ORAL_TABLET | Freq: Three times a day (TID) | ORAL | 0 refills | Status: DC

## 2017-12-28 NOTE — Telephone Encounter (Signed)
Rx sent to [facility] phone : [phone] , fax : [phone]  

## 2018-01-02 ENCOUNTER — Other Ambulatory Visit
Admission: RE | Admit: 2018-01-02 | Discharge: 2018-01-02 | Disposition: A | Discharge status: Home / Self Care | Payer: Medicare Other | Source: Ambulatory Visit | Attending: Gerontology | Admitting: Gerontology

## 2018-01-02 DIAGNOSIS — Z8619 Personal history of other infectious and parasitic diseases: Secondary | ICD-10-CM | POA: Diagnosis present

## 2018-01-02 DIAGNOSIS — Z79899 Other long term (current) drug therapy: Secondary | ICD-10-CM | POA: Insufficient documentation

## 2018-01-02 LAB — C DIFFICILE QUICK SCREEN W PCR REFLEX
C DIFFICILE (CDIFF) INTERP: NOT DETECTED
C Diff antigen: NEGATIVE
C Diff toxin: NEGATIVE

## 2018-01-04 ENCOUNTER — Encounter: Payer: Self-pay | Admitting: Diagnostic Radiology

## 2018-01-18 ENCOUNTER — Ambulatory Visit: Payer: 59 | Admitting: Internal Medicine

## 2018-01-18 ENCOUNTER — Other Ambulatory Visit: Payer: 59

## 2018-01-23 ENCOUNTER — Encounter
Admission: RE | Admit: 2018-01-23 | Discharge: 2018-01-23 | Disposition: A | Discharge status: Home / Self Care | Payer: Medicare Other | Source: Ambulatory Visit | Attending: Internal Medicine | Admitting: Internal Medicine

## 2018-01-25 ENCOUNTER — Encounter: Payer: Self-pay | Admitting: Gerontology

## 2018-01-25 ENCOUNTER — Non-Acute Institutional Stay (SKILLED_NURSING_FACILITY): Payer: Medicare Other | Admitting: Gerontology

## 2018-01-25 DIAGNOSIS — E118 Type 2 diabetes mellitus with unspecified complications: Secondary | ICD-10-CM | POA: Diagnosis not present

## 2018-01-25 DIAGNOSIS — K529 Noninfective gastroenteritis and colitis, unspecified: Secondary | ICD-10-CM | POA: Diagnosis not present

## 2018-01-25 DIAGNOSIS — G8929 Other chronic pain: Secondary | ICD-10-CM | POA: Insufficient documentation

## 2018-01-25 DIAGNOSIS — A0472 Enterocolitis due to Clostridium difficile, not specified as recurrent: Secondary | ICD-10-CM | POA: Insufficient documentation

## 2018-01-25 DIAGNOSIS — Z794 Long term (current) use of insulin: Secondary | ICD-10-CM

## 2018-01-25 NOTE — Assessment & Plan Note (Signed)
Stable/Improving. Pt continues of po Vancomycin 125 mg po QID and Florastor 250 mg po BID.Diarrhea improving. Pt remains on Enteric Precautions.

## 2018-01-25 NOTE — Assessment & Plan Note (Signed)
Stable/ improved. Pt reports pain is now managed/controlled with scheduled medications. Pt is on Scheduled Tylenol 500 mg po QID and 650 mg Q 4 hours prn, Tramadol 50 mg po QID and Q 6 hours prn and Robaxin 500 mg po Q 6 hours prn. She also receives the Lidocaine patch daily and Voltaren gel 1%- 4 grams to the knees QID.

## 2018-01-25 NOTE — Progress Notes (Signed)
Location:   [facility] Room Number: [place] Place of Service:  SNF [phone]) Provider:  Toni Arthurs, NP-C  Glendon Axe, MD  Patient Care Team: Glendon Axe, MD as PCP - General (Internal Medicine)  Extended Emergency Contact Information Primary Emergency Contact: Abran Duke [place] of [place] Phone: [phone] Mobile Phone: [phone] Relation: Son Secondary Emergency Contact: Leta Jungling [place] Phone: [phone] Relation: Brother  Code Status:  DNR Goals of care: Advanced Directive information Advanced Directives 01/25/2018  Does Patient Have a Medical Advance Directive? Yes  Type of Advance Directive Out of facility DNR (pink MOST or yellow form)  Does patient want to make changes to medical advance directive? No - Patient declined  Copy of [place] in Chart? -  Would patient like information on creating a medical advance directive? -     Chief Complaint  Patient presents with  . Medical Management of Chronic Issues    Routine Visit    HPI:  Pt is a 84 y.o. female seen today for medical management of chronic diseases.    Chronic diarrhea Resolved. Post treatment stool culture negative for C-diff. Continues on Florastor 250 mg po BID  Acute colitis Resolved. No further complaints of diarrhea or abdominal pain. Having regular, formed BMs. Continues on Florastor 250 mg po BID.   Type 2 diabetes mellitus with complication, with long-term current use of insulin (HCC) Stable. No significant elevations in blood glucose. No recent episodes of hypoglycemia. No medicinal treatment. FSBS being checked Q Day and prn.    Past Medical History:  Diagnosis Date  . Anemia, unspecified   . Anxiety    unspecified  . Chest pain syndrome   . Chronic back pain   . Chronic diarrhea   . Depression    unspecified  . Diarrhea, unspecified   . Diverticulosis    with recurrent LGI  bleeds.S/p xfusion 11/12.   . Essential hypertension, benign   . GERD (gastroesophageal reflux disease)   . Gout   . Headache   . Herniated nucleus pulposus, lumbar    low back, stable  . History of chicken pox   . History of GI bleed    x2 in 09/1997, and 01/2000, both due to diverticulosis  . Hyperlipidemia    other and unspecified  . Hypertension   . IDA (iron deficiency anemia)   . Insomnia   . Non-insulin dependent type 2 diabetes mellitus ([place])   . Pancreatitis   . Recurrent pancreatitis   . Sleep disturbance    chronic  . Type II diabetes mellitus (HCC)    Type II or unspecified type diabetes mellitus without mention of complication, not stated as uncontrolled (CMS-HCC)   Past Surgical History:  Procedure Laterality Date  . APPENDECTOMY    . CARDIAC CATHETERIZATION     x2 in 1991 and 01/2008  . CHOLECYSTECTOMY    . COLONOSCOPY     1992, 2001, 2012  . EXPLORATORY LAPAROTOMY     for lysis of adhesions  . FLEXIBLE SIGMOIDOSCOPY  02/06/1993  . HERNIA REPAIR     abdominal hernia repaired x2  . KNEE ARTHROSCOPY Left 1977  . ROTATOR CUFF REPAIR  1998  . TOTAL ABDOMINAL HYSTERECTOMY W/ BILATERAL SALPINGOOPHORECTOMY     one ovary still left    Allergies  Allergen Reactions  . Ciprofloxacin Hives  . Codeine Other (See Comments)    Other reaction(s): Headache Reaction:  Headaches   .  Escitalopram Oxalate     Other reaction(s): Other (See Comments) Agitation  . Metronidazole Nausea Only  . Other     Other reaction(s): Unknown anti- Inflammatory and bee stings  . Pantoprazole Nausea Only  . Phenergan [Promethazine Hcl] Other (See Comments)    Reaction:  Unknown   . Sulfamethoxazole-Trimethoprim     Other reaction(s): Unknown    Allergies as of 01/25/2018      Reactions   Ciprofloxacin Hives   Codeine Other (See Comments)   Other reaction(s): Headache Reaction:  Headaches    Escitalopram Oxalate    Other reaction(s): Other (See Comments) Agitation    Metronidazole Nausea Only   Other    Other reaction(s): Unknown anti- Inflammatory and bee stings   Pantoprazole Nausea Only   Phenergan [promethazine Hcl] Other (See Comments)   Reaction:  Unknown    Sulfamethoxazole-trimethoprim    Other reaction(s): Unknown      Medication List        Accurate as of 01/25/18 12:42 PM. Always use your most recent med list.          acetaminophen 500 MG tablet Commonly known as:  TYLENOL Take 500 mg by mouth 4 (four) times daily.   acetaminophen 325 MG tablet Commonly known as:  TYLENOL Take 650 mg by mouth every 4 (four) hours as needed. for pain/ increased temp. May be administered orally, per G-tube if needed or rectally if unable to swallow (separate order). Maximum dose for 24 hours is 3,000 mg from all sources of Acetaminophen/ Tylenol   atorvastatin 20 MG tablet Commonly known as:  LIPITOR Take 20 mg by mouth at bedtime.   diclofenac sodium 1 % Gel Commonly known as:  VOLTAREN Apply 4 g topically 4 (four) times daily. apply to Both knees for pain   ENSURE ENLIVE PO Take 1 Bottle by mouth 2 (two) times daily between meals. low protein, low albumin   famotidine 20 MG tablet Commonly known as:  PEPCID Take 1 tablet (20 mg total) by mouth at bedtime.   feeding supplement (PRO-STAT SUGAR FREE 64) Liqd Take 30 mLs by mouth 2 (two) times daily between meals. low protein, low albumin   Lidocaine 4 % Ptch Apply 1 patch topically 2 (two) times daily. Apply to lower back   memantine 5 MG tablet Commonly known as:  NAMENDA Take 5 mg by mouth 2 (two) times daily.   methocarbamol 500 MG tablet Commonly known as:  ROBAXIN Take 500 mg by mouth every 6 (six) hours as needed for muscle spasms.   nystatin cream Commonly known as:  MYCOSTATIN Apply 1 application topically 2 (two) times daily. apply to redness in peri-area until healed   propranolol 20 MG tablet Commonly known as:  INDERAL Take 1 tablet (20 mg total) by mouth 3 (three)  times daily.   saccharomyces boulardii 250 MG capsule Commonly known as:  FLORASTOR Take 1 capsule (250 mg total) by mouth 2 (two) times daily.   traMADol 50 MG tablet Commonly known as:  ULTRAM Take by mouth 4 (four) times daily.   traMADol 50 MG tablet Commonly known as:  ULTRAM Take 1 tablet (50 mg total) by mouth every 6 (six) hours as needed for moderate pain.   traZODone 50 MG tablet Commonly known as:  DESYREL Take 0.5 tablets (25 mg total) by mouth at bedtime as needed for sleep.       Review of Systems  Constitutional: Negative for activity change, appetite change, chills, diaphoresis and  fever.  HENT: Negative for congestion, mouth sores, nosebleeds, postnasal drip, sneezing, sore throat, trouble swallowing and voice change.   Respiratory: Negative for apnea, cough, choking, chest tightness, shortness of breath and wheezing.   Cardiovascular: Negative for chest pain, palpitations and leg swelling.  Gastrointestinal: Negative for abdominal distention, abdominal pain, constipation, diarrhea and nausea.  Genitourinary: Negative for difficulty urinating, dysuria, frequency and urgency.  Musculoskeletal: Positive for arthralgias (typical arthritis) and gait problem. Negative for back pain and myalgias.  Skin: Negative for color change, pallor, rash and wound.  Neurological: Positive for weakness. Negative for dizziness, tremors, syncope, speech difficulty, numbness and headaches.  Psychiatric/Behavioral: Negative for agitation and behavioral problems.  All other systems reviewed and are negative.   Immunization History  Administered Date(s) Administered  . Influenza Inj Mdck Quad Pf 07/25/2017  . Influenza-Unspecified 07/25/2014, 08/02/2016, 07/25/2017  . Pneumococcal-Unspecified 07/25/2014   Pertinent  Health Maintenance Due  Topic Date Due  . FOOT EXAM  12/10/1938  . OPHTHALMOLOGY EXAM  12/10/1938  . URINE MICROALBUMIN  12/10/1938  . DEXA SCAN  12/11/1995  .  HEMOGLOBIN A1C  07/26/2012  . PNA vac Low Risk Adult (2 of 2 - PCV13) 07/26/2015  . INFLUENZA VACCINE  05/25/2018   No flowsheet data found. Functional Status Survey:    Vitals:   01/25/18 1233  BP: (!) 146/77  Pulse: 87  Resp: 20  Temp: 98.1 F (36.7 C)  TempSrc: Oral  SpO2: 96%  Weight: 96 lb 1.6 oz (43.6 kg)  Height: 4\' 9"  (1.448 m)   Body mass index is 20.8 kg/m. Physical Exam  Constitutional: She is oriented to person, place, and time. Vital signs are normal. She appears well-developed and well-nourished. She is active and cooperative. She does not appear ill. No distress.  HENT:  Head: Normocephalic and atraumatic.  Mouth/Throat: Uvula is midline, oropharynx is clear and moist and mucous membranes are normal. Mucous membranes are not pale, not dry and not cyanotic.  Eyes: Pupils are equal, round, and reactive to light. Conjunctivae, EOM and lids are normal.  Neck: Trachea normal, normal range of motion and full passive range of motion without pain. Neck supple. No JVD present. No tracheal deviation, no edema and no erythema present. No thyromegaly present.  Cardiovascular: Normal rate, regular rhythm, normal heart sounds, intact distal pulses and normal pulses. Exam reveals no gallop, no distant heart sounds and no friction rub.  No murmur heard. Pulses:      Dorsalis pedis pulses are 2+ on the right side, and 2+ on the left side.  No edema  Pulmonary/Chest: Effort normal and breath sounds normal. No accessory muscle usage. No respiratory distress. She has no decreased breath sounds. She has no wheezes. She has no rhonchi. She has no rales. She exhibits no tenderness.  Abdominal: Soft. Normal appearance and bowel sounds are normal. She exhibits no distension and no ascites. There is no tenderness.  Musculoskeletal: Normal range of motion. She exhibits no edema or tenderness.  Expected osteoarthritis, stiffness; Bilateral Calves soft, supple. Negative Homan's Sign. B- pedal  pulses equal; generalized wekaness  Neurological: She is alert and oriented to person, place, and time. She has normal strength. She exhibits abnormal muscle tone. Coordination and gait abnormal.  Skin: Skin is warm, dry and intact. She is not diaphoretic. No cyanosis. No pallor. Nails show no clubbing.  Psychiatric: She has a normal mood and affect. Her speech is normal and behavior is normal. Judgment and thought content normal. Cognition and memory are  normal.  Nursing note and vitals reviewed.   Labs reviewed: Recent Labs    12/11/17 0711 12/13/17 0500 12/14/17 0510  NA 133* 133* 132*  K 4.1 4.1 4.4  CL 107 105 103  CO2 16* 20* 20*  GLUCOSE 136* 83 87  BUN 18 17 15   CREATININE 1.03* 0.98 0.83  CALCIUM 8.4* 8.3* 8.4*   Recent Labs    12/08/17 1825 12/10/17 1606 12/14/17 0510  AST 25 32 24  ALT 13* 20 12*  ALKPHOS 77 90 65  BILITOT 0.4 0.6 0.7  PROT 7.0 6.2* 6.2*  ALBUMIN 2.7* 2.2* 2.3*   Recent Labs    06/27/17 1658  09/11/17 1823  12/08/17 1825 12/09/17 1029 12/14/17 0510  WBC 22.0*   < > 12.4*   < > 16.0* 13.6* 12.5*  NEUTROABS 18.8*  --  6.8*  --   --   --  7.4*  HGB 13.1   < > 12.4   < > 10.8* 11.6* 11.2*  HCT 39.8   < > 37.5   < > 33.3* 35.5 34.6*  MCV 84.3   < > 83.6   < > 84.2 85.3 84.0  PLT 453*   < > 388   < > 398 372 511*   < > = values in this interval not displayed.   No results found for: TSH Lab Results  Component Value Date   HGBA1C 7.5 (H) 01/25/2012   Lab Results  Component Value Date   CHOL 106 11/29/2012   HDL 27 (L) 11/29/2012   LDLCALC 50 11/29/2012   TRIG 144 11/29/2012    Significant Diagnostic Results in last 30 days:  No results found.  Assessment/Plan  Chronic diarrhea  Acute colitis  Type 2 diabetes mellitus with complication, with long-term current use of insulin ([place])   Above listed conditions stable  Continue current medication regimen  Monitor for recurrent diarrhea  Monitor for worsening abdominal  pain  Monitor for elevations for reductions in daily blood glucose  Encourage po fluid intake  Continue Palliative Care services  Family/ staff Communication:   Total Time:  Documentation:  Face to Face:  Family/Phone:   Labs/tests ordered:  Recent labs reviewed, not due  Medication list reviewed and assessed for continued appropriateness. Monthly medication orders reviewed and signed.  Vikki Ports, NP-C Geriatrics [facility] [phone], [place] [address] Cell Phone (Mon-Fri 8am-5pm):  [phone] On Call:  [phone] & follow prompts after 5pm & weekends Office Phone:  [phone] Office Fax:  [phone]

## 2018-01-25 NOTE — Assessment & Plan Note (Signed)
Stable. No recent episodes of hyper or hypotension. Pt denies chest pain or shortness of breath. Symptoms controlled with Propranolol 20 mg po TID only.

## 2018-01-26 NOTE — Assessment & Plan Note (Signed)
Resolved. No further complaints of diarrhea or abdominal pain. Having regular, formed BMs. Continues on Florastor 250 mg po BID.

## 2018-01-26 NOTE — Assessment & Plan Note (Signed)
Stable. No significant elevations in blood glucose. No recent episodes of hypoglycemia. No medicinal treatment. FSBS being checked Q Day and prn.

## 2018-01-26 NOTE — Assessment & Plan Note (Signed)
Resolved. Post treatment stool culture negative for C-diff. Continues on Florastor 250 mg po BID

## 2018-02-03 ENCOUNTER — Other Ambulatory Visit
Admission: RE | Admit: 2018-02-03 | Discharge: 2018-02-03 | Disposition: A | Discharge status: Home / Self Care | Source: Ambulatory Visit | Attending: Gerontology | Admitting: Gerontology

## 2018-02-03 DIAGNOSIS — R300 Dysuria: Secondary | ICD-10-CM | POA: Insufficient documentation

## 2018-02-03 DIAGNOSIS — R350 Frequency of micturition: Secondary | ICD-10-CM | POA: Diagnosis not present

## 2018-02-03 LAB — URINALYSIS, COMPLETE (UACMP) WITH MICROSCOPIC
BILIRUBIN URINE: NEGATIVE
Glucose, UA: NEGATIVE mg/dL
Ketones, ur: NEGATIVE mg/dL
NITRITE: POSITIVE — AB
PH: 5 (ref 5.0–8.0)
Protein, ur: 100 mg/dL — AB
SPECIFIC GRAVITY, URINE: 1.019 (ref 1.005–1.030)

## 2018-02-06 LAB — URINE CULTURE

## 2018-02-18 DIAGNOSIS — R309 Painful micturition, unspecified: Secondary | ICD-10-CM | POA: Insufficient documentation

## 2018-02-18 DIAGNOSIS — R350 Frequency of micturition: Secondary | ICD-10-CM | POA: Insufficient documentation

## 2018-02-19 ENCOUNTER — Other Ambulatory Visit
Admission: RE | Admit: 2018-02-19 | Discharge: 2018-02-19 | Disposition: A | Discharge status: Home / Self Care | Source: Skilled Nursing Facility | Attending: Gerontology | Admitting: Gerontology

## 2018-02-19 LAB — URINALYSIS, COMPLETE (UACMP) WITH MICROSCOPIC
Bilirubin Urine: NEGATIVE
Glucose, UA: NEGATIVE mg/dL
Ketones, ur: NEGATIVE mg/dL
Nitrite: NEGATIVE
Protein, ur: 100 mg/dL — AB
SPECIFIC GRAVITY, URINE: 1.018 (ref 1.005–1.030)
WBC, UA: >50 WBC/hpf — ABNORMAL HIGH (ref 0–5)
pH: 5 (ref 5.0–8.0)

## 2018-02-22 ENCOUNTER — Encounter
Admission: RE | Admit: 2018-02-22 | Discharge: 2018-02-22 | Disposition: A | Discharge status: Home / Self Care | Source: Ambulatory Visit | Attending: Internal Medicine | Admitting: Internal Medicine

## 2018-02-22 ENCOUNTER — Other Ambulatory Visit: Payer: Self-pay

## 2018-02-22 MED ORDER — CLONAZEPAM 0.5 MG PO TABS: 0.5000 mg | ORAL_TABLET | Freq: Two times a day (BID) | ORAL | 3 refills | Status: DC

## 2018-02-22 NOTE — Telephone Encounter (Signed)
Rx sent to [facility] phone : [phone] , fax : [phone]  

## 2018-02-23 LAB — URINE CULTURE: Culture: >=100000 — AB

## 2018-03-01 ENCOUNTER — Other Ambulatory Visit: Payer: Self-pay

## 2018-03-01 MED ORDER — CLONAZEPAM 0.5 MG PO TABS: 0.2500 mg | ORAL_TABLET | Freq: Two times a day (BID) | ORAL | 3 refills | Status: DC

## 2018-03-01 NOTE — Telephone Encounter (Signed)
Rx sent to [facility] phone : [phone] , fax : [phone]  

## 2018-03-02 ENCOUNTER — Non-Acute Institutional Stay (SKILLED_NURSING_FACILITY): Payer: Medicare Other | Admitting: Gerontology

## 2018-03-02 ENCOUNTER — Encounter: Payer: Self-pay | Admitting: Gerontology

## 2018-03-02 DIAGNOSIS — G301 Alzheimer's disease with late onset: Secondary | ICD-10-CM | POA: Diagnosis not present

## 2018-03-02 DIAGNOSIS — M170 Bilateral primary osteoarthritis of knee: Secondary | ICD-10-CM | POA: Diagnosis not present

## 2018-03-02 DIAGNOSIS — E46 Unspecified protein-calorie malnutrition: Secondary | ICD-10-CM | POA: Diagnosis not present

## 2018-03-02 DIAGNOSIS — F0280 Dementia in other diseases classified elsewhere without behavioral disturbance: Secondary | ICD-10-CM | POA: Diagnosis not present

## 2018-03-04 DIAGNOSIS — E46 Unspecified protein-calorie malnutrition: Secondary | ICD-10-CM | POA: Insufficient documentation

## 2018-03-04 NOTE — Assessment & Plan Note (Signed)
Stable. No c/o recent worsening pain. On Tylenol 650 mg po QID scheduled, Voltaren 1% 4 grams QID, Robaxin 500 mg po Q 6 hours prn, Tramadol 50 mg po QID and Q 6 hours prn. Also, has Roxanol 20 mg/ml 0.25-0.5 mL po Q 1 hour prn available.

## 2018-03-04 NOTE — Progress Notes (Signed)
Location:    Nursing Home Room Number: 026V Place of Service:  SNF (31) Provider:  Toni Arthurs, NP-C  Kirk Ruths, MD  Patient Care Team: Kirk Ruths, MD as PCP - General (Internal Medicine) Toni Arthurs, NP as Nurse Practitioner (Family Medicine)  Extended Emergency Contact Information Primary Emergency Contact: Abran [place] Phone: [phone] Mobile Phone: [phone] Relation: Son Secondary Emergency Contact: [place] Phone: [phone] Relation: Brother  Code Status:  DNR Goals of care: Advanced Directive information Advanced Directives 03/02/2018  Does Patient Have a Medical Advance Directive? Yes  Type of Advance Directive Out of facility DNR (pink MOST or yellow form)  Does patient want to make changes to medical advance directive? No - Patient declined  Copy of [place] in Chart? -  Would patient like information on creating a medical advance directive? -     Chief Complaint  Patient presents with  . Medical Management of Chronic Issues    Routine Visit    HPI:  Pt is a 84 y.o. female seen today for medical management of chronic diseases.    Alzheimer's dementia, late onset Stable. Slowly progressive. No negative behaviors. Pleasant. Not on medications for dementia  Primary osteoarthritis of both knees Stable. No c/o recent worsening pain. On Tylenol 650 mg po QID scheduled, Voltaren 1% 4 grams QID, Robaxin 500 mg po Q 6 hours prn, Tramadol 50 mg po QID and Q 6 hours prn. Also, has Roxanol 20 mg/ml 0.25-0.5 mL po Q 1 hour prn available.   Protein-calorie malnutrition ([name]) Persistent. Albumin 2.3, Protein 6.2. On Ensure Enlive 1 bottle po BID and Marinol 2.5 mg po BID for appetite stimulant.     Past Medical History:  Diagnosis Date  . Anemia, unspecified   . Anxiety    unspecified  . Chest pain syndrome   . Chronic back pain   . Chronic  diarrhea   . Depression    unspecified  . Diarrhea, unspecified   . Diverticulosis    with recurrent LGI bleeds.S/p xfusion 11/12.   . Essential hypertension, benign   . GERD (gastroesophageal reflux disease)   . Gout   . Headache   . Herniated nucleus pulposus, lumbar    low back, stable  . History of chicken pox   . History of GI bleed    x2 in 09/1997, and 01/2000, both due to diverticulosis  . Hyperlipidemia    other and unspecified  . Hypertension   . IDA (iron deficiency anemia)   . Insomnia   . Non-insulin dependent type 2 diabetes mellitus ([place])   . Pancreatitis   . Recurrent pancreatitis   . Sleep disturbance    chronic  . Type II diabetes mellitus (HCC)    Type II or unspecified type diabetes mellitus without mention of complication, not stated as uncontrolled (CMS-HCC)   Past Surgical History:  Procedure Laterality Date  . APPENDECTOMY    . CARDIAC CATHETERIZATION     x2 in 1991 and 01/2008  . CHOLECYSTECTOMY    . COLONOSCOPY     1992, 2001, 2012  . EXPLORATORY LAPAROTOMY     for lysis of adhesions  . FLEXIBLE SIGMOIDOSCOPY  02/06/1993  . HERNIA REPAIR     abdominal hernia repaired x2  . KNEE ARTHROSCOPY Left 1977  . ROTATOR CUFF REPAIR  1998  . TOTAL ABDOMINAL HYSTERECTOMY W/ BILATERAL SALPINGOOPHORECTOMY     one ovary still left  Allergies  Allergen Reactions  . Ciprofloxacin Hives  . Codeine Other (See Comments)    Other reaction(s): Headache Reaction:  Headaches   . Escitalopram Oxalate     Other reaction(s): Other (See Comments) Agitation  . Metronidazole Nausea Only  . Other     Other reaction(s): Unknown anti- Inflammatory and bee stings  . Pantoprazole Nausea Only  . Phenergan [Promethazine Hcl] Other (See Comments)    Reaction:  Unknown   . Sulfamethoxazole-Trimethoprim     Other reaction(s): Unknown    Allergies as of 03/02/2018      Reactions   Ciprofloxacin Hives   Codeine Other (See Comments)   Other reaction(s):  Headache Reaction:  Headaches    Escitalopram Oxalate    Other reaction(s): Other (See Comments) Agitation   Metronidazole Nausea Only   Other    Other reaction(s): Unknown anti- Inflammatory and bee stings   Pantoprazole Nausea Only   Phenergan [promethazine Hcl] Other (See Comments)   Reaction:  Unknown    Sulfamethoxazole-trimethoprim    Other reaction(s): Unknown      Medication List        Accurate as of 03/02/18 11:59 PM. Always use your most recent med list.          acetaminophen 500 MG tablet Commonly known as:  TYLENOL Take 500 mg by mouth 4 (four) times daily.   acetaminophen 325 MG tablet Commonly known as:  TYLENOL Take 650 mg by mouth every 4 (four) hours as needed. for pain/ increased temp. May be administered orally, per G-tube if needed or rectally if unable to swallow (separate order). Maximum dose for 24 hours is 3,000 mg from all sources of Acetaminophen/ Tylenol   clonazePAM 0.5 MG tablet Commonly known as:  KLONOPIN Take 0.5 tablets (0.25 mg total) by mouth every 12 (twelve) hours.   DERMACLOUD EX Apply liberal amount topically to area of skin irritation prn. OK to leave at bedside   diclofenac sodium 1 % Gel Commonly known as:  VOLTAREN Apply 4 g topically 4 (four) times daily. apply to Both knees for pain   dronabinol 2.5 MG capsule Commonly known as:  MARINOL Take 2.5 mg by mouth 2 (two) times daily before a meal.   ENSURE ENLIVE PO Take 1 Bottle by mouth 2 (two) times daily between meals. low protein, low albumin   famotidine 20 MG tablet Commonly known as:  PEPCID Take 1 tablet (20 mg total) by mouth at bedtime.   Lidocaine 4 % Ptch Apply 1 patch topically 2 (two) times daily. Apply to lower back   methocarbamol 500 MG tablet Commonly known as:  ROBAXIN Take 500 mg by mouth every 6 (six) hours as needed for muscle spasms.   morphine 20 MG/ML concentrated solution Commonly known as:  ROXANOL Take 5-10 mg by mouth every hour as  needed. for pain or SOB (0.25- mild sx; -.5 ml- moderate to severe sx)   nystatin cream Commonly known as:  MYCOSTATIN Apply 1 application topically 2 (two) times daily. apply to redness in peri-area until healed   omeprazole 20 MG capsule Commonly known as:  PRILOSEC Take 20 mg by mouth daily.   ondansetron 4 MG tablet Commonly known as:  ZOFRAN Take 4 mg by mouth every 4 (four) hours as needed for nausea or vomiting.   propranolol 10 MG tablet Commonly known as:  INDERAL Take 10 mg by mouth 3 (three) times daily.   RISA-BID PROBIOTIC Tabs Take 1 tablet by mouth 2 (two)  times daily.   sorbitol 70 % solution Take 30 mLs by mouth every 2 (two) hours as needed.   traMADol 50 MG tablet Commonly known as:  ULTRAM Take 50 mg by mouth 4 (four) times daily.   traMADol 50 MG tablet Commonly known as:  ULTRAM Take 1 tablet (50 mg total) by mouth every 6 (six) hours as needed for moderate pain.       Review of Systems  Constitutional: Negative for activity change, appetite change, chills, diaphoresis and fever.  HENT: Negative for congestion, mouth sores, nosebleeds, postnasal drip, sneezing, sore throat, trouble swallowing and voice change.   Respiratory: Negative for apnea, cough, choking, chest tightness, shortness of breath and wheezing.   Cardiovascular: Negative for chest pain, palpitations and leg swelling.  Gastrointestinal: Negative for abdominal distention, abdominal pain, constipation, diarrhea and nausea.  Genitourinary: Negative for difficulty urinating, dysuria, frequency and urgency.  Musculoskeletal: Positive for arthralgias (typical arthritis), back pain and gait problem. Negative for myalgias.  Skin: Negative for color change, pallor, rash and wound.  Neurological: Positive for weakness. Negative for dizziness, tremors, syncope, speech difficulty, numbness and headaches.  Psychiatric/Behavioral: Negative for agitation and behavioral problems. The patient is  nervous/anxious.   All other systems reviewed and are negative.   Immunization History  Administered Date(s) Administered  . Influenza Inj Mdck Quad Pf 07/25/2017  . Influenza-Unspecified 07/25/2014, 08/02/2016, 07/25/2017  . Pneumococcal-Unspecified 07/25/2014   Pertinent  Health Maintenance Due  Topic Date Due  . FOOT EXAM  12/10/1938  . OPHTHALMOLOGY EXAM  12/10/1938  . URINE MICROALBUMIN  [birth year]10/1938  . DEXA SCAN  12/11/1995  . HEMOGLOBIN A1C  07/26/2012  . PNA vac Low Risk Adult (2 of 2 - PCV13) 07/26/2015  . INFLUENZA VACCINE  05/25/2018   No flowsheet data found. Functional Status Survey:    Vitals:   03/02/18 0842  BP: 124/68  Pulse: 60  Resp: 18  Temp: 97.8 F (36.6 C)  TempSrc: Oral  SpO2: 96%  Weight: 89 lb 1.6 oz (40.4 kg)  Height: 4\' 9"  (1.448 m)   Body mass index is 19.28 kg/m. Physical Exam  Constitutional: She is oriented to person, place, and time. Vital signs are normal. She appears well-developed and well-nourished. She is active and cooperative. She does not appear ill. No distress.  HENT:  Head: Normocephalic and atraumatic.  Mouth/Throat: Uvula is midline, oropharynx is clear and moist and mucous membranes are normal. Mucous membranes are not pale, not dry and not cyanotic.  Eyes: Pupils are equal, round, and reactive to light. Conjunctivae, EOM and lids are normal.  Neck: Trachea normal, normal range of motion and full passive range of motion without pain. Neck supple. No JVD present. No tracheal deviation, no edema and no erythema present. No thyromegaly present.  Cardiovascular: Normal rate, regular rhythm, normal heart sounds, intact distal pulses and normal pulses. Exam reveals no gallop, no distant heart sounds and no friction rub.  No murmur heard. Pulses:      Dorsalis pedis pulses are 2+ on the right side, and 2+ on the left side.  No edema  Pulmonary/Chest: Effort normal and breath sounds normal. No accessory muscle usage. No  respiratory distress. She has no decreased breath sounds. She has no wheezes. She has no rhonchi. She has no rales. She exhibits no tenderness.  Abdominal: Soft. Normal appearance and bowel sounds are normal. She exhibits no distension and no ascites. There is no tenderness.  Musculoskeletal: Normal range of motion. She exhibits no edema  or tenderness.  Expected osteoarthritis, stiffness; Bilateral Calves soft, supple. Negative Homan's Sign. B- pedal pulses equal; chronic back pain  Neurological: She is alert and oriented to person, place, and time. She has normal strength. She displays atrophy. She exhibits abnormal muscle tone. Coordination and gait abnormal.  Skin: Skin is warm, dry and intact. She is not diaphoretic. No cyanosis. No pallor. Nails show no clubbing.  Psychiatric: Her speech is normal and behavior is normal. Judgment and thought content normal. Her mood appears anxious. Cognition and memory are impaired. She exhibits abnormal recent memory.  Nursing note and vitals reviewed.   Labs reviewed: Recent Labs    12/11/17 0711 12/13/17 0500 12/14/17 0510  NA 133* 133* 132*  K 4.1 4.1 4.4  CL 107 105 103  CO2 16* 20* 20*  GLUCOSE 136* 83 87  BUN 18 17 15   CREATININE 1.03* 0.98 0.83  CALCIUM 8.4* 8.3* 8.4*   Recent Labs    12/08/17 1825 12/10/17 1606 12/14/17 0510  AST 25 32 24  ALT 13* 20 12*  ALKPHOS 77 90 65  BILITOT 0.4 0.6 0.7  PROT 7.0 6.2* 6.2*  ALBUMIN 2.7* 2.2* 2.3*   Recent Labs    06/27/17 1658  09/11/17 1823  12/08/17 1825 12/09/17 1029 12/14/17 0510  WBC 22.0*   < > 12.4*   < > 16.0* 13.6* 12.5*  NEUTROABS 18.8*  --  6.8*  --   --   --  7.4*  HGB 13.1   < > 12.4   < > 10.8* 11.6* 11.2*  HCT 39.8   < > 37.5   < > 33.3* 35.5 34.6*  MCV 84.3   < > 83.6   < > 84.2 85.3 84.0  PLT 453*   < > 388   < > 398 372 511*   < > = values in this interval not displayed.   No results found for: TSH Lab Results  Component Value Date   HGBA1C 7.5 (H)  01/25/2012   Lab Results  Component Value Date   CHOL 106 11/29/2012   HDL 27 (L) 11/29/2012   LDLCALC 50 11/29/2012   TRIG 144 11/29/2012    Significant Diagnostic Results in last 30 days:  No results found.  Assessment/Plan Cindy Sullivan was seen today for medical management of chronic issues.  Diagnoses and all orders for this visit:  Late onset Alzheimer's disease without behavioral disturbance  Primary osteoarthritis of both knees  Protein-calorie malnutrition, unspecified severity ([place])   Above listed conditions stable, slowly progressive  Continue current medication regimen  Continue to encourage po intake  Continue to encourage participation in activities and interaction with other residents  Safety precautions  Fall precautions  Monitor for worsening pain  Continue Hospice care  Family/ staff Communication:   Total Time:  Documentation:  Face to Face:  Family/Phone:   Labs/tests ordered:  Not due- on Hospice care  Medication list reviewed and assessed for continued appropriateness. Monthly medication orders reviewed and signed.  Vikki Ports, NP-C Geriatrics [facility] [phone], [place] [address] Cell Phone (Mon-Fri 8am-5pm):  [phone] On Call:  [phone] & follow prompts after 5pm & weekends Office Phone:  [phone] Office Fax:  [phone]

## 2018-03-04 NOTE — Assessment & Plan Note (Signed)
Stable. Slowly progressive. No negative behaviors. Pleasant. Not on medications for dementia

## 2018-03-04 NOTE — Assessment & Plan Note (Signed)
Persistent. Albumin 2.3, Protein 6.2. On Ensure Enlive 1 bottle po BID and Marinol 2.5 mg po BID for appetite stimulant.

## 2018-03-13 ENCOUNTER — Other Ambulatory Visit: Payer: Self-pay

## 2018-03-13 ENCOUNTER — Other Ambulatory Visit
Admission: RE | Admit: 2018-03-13 | Discharge: 2018-03-13 | Disposition: A | Discharge status: Home / Self Care | Source: Ambulatory Visit | Attending: Internal Medicine | Admitting: Internal Medicine

## 2018-03-13 DIAGNOSIS — N390 Urinary tract infection, site not specified: Secondary | ICD-10-CM | POA: Insufficient documentation

## 2018-03-13 LAB — URINALYSIS, ROUTINE W REFLEX MICROSCOPIC
GLUCOSE, UA: NEGATIVE mg/dL
Ketones, ur: 5 mg/dL — AB
NITRITE: NEGATIVE
Protein, ur: 100 mg/dL — AB
RBC / HPF: >50 RBC/hpf — ABNORMAL HIGH (ref 0–5)
Specific Gravity, Urine: 1.018 (ref 1.005–1.030)
WBC, UA: >50 WBC/hpf — ABNORMAL HIGH (ref 0–5)
pH: 5 (ref 5.0–8.0)

## 2018-03-13 MED ORDER — MORPHINE SULFATE (CONCENTRATE) 20 MG/ML PO SOLN: 5.0000 mg | ORAL | 0 refills | Status: AC | PRN

## 2018-03-13 MED ORDER — LORAZEPAM 0.5 MG PO TABS: 0.5000 mg | ORAL_TABLET | ORAL | 0 refills | Status: AC

## 2018-03-13 MED ORDER — LORAZEPAM 0.5 MG PO TABS: 0.5000 mg | ORAL_TABLET | ORAL | 0 refills | Status: AC | PRN

## 2018-03-13 MED ORDER — MORPHINE SULFATE (CONCENTRATE) 20 MG/ML PO SOLN: 5.0000 mg | ORAL | 0 refills | Status: AC

## 2018-03-13 NOTE — Telephone Encounter (Signed)
Rx sent to [facility] phone : [phone] , fax : [phone]  

## 2018-03-14 LAB — URINE CULTURE

## 2018-03-25 DEATH — deceased

## 2018-06-22 IMAGING — CT CT ABD-PELV W/O CM
2 of 4 series · 15 of 46 positions shown, 17 images · non-contrast
Comparison: CT abdomen and pelvis from 04/30/2017

CLINICAL DATA: Diarrhea earlier this morning.

EXAM:
CT ABDOMEN AND PELVIS WITHOUT CONTRAST
TECHNIQUE: Multidetector CT imaging of the abdomen and pelvis was performed
following the standard protocol without IV contrast.

[Series 2: routine abd/pel wo · axial · 0.77mm/px · z∈[-395,-25]mm · 12 of 85 slices shown, 14 images]
[im 7/85  soft-tissue]
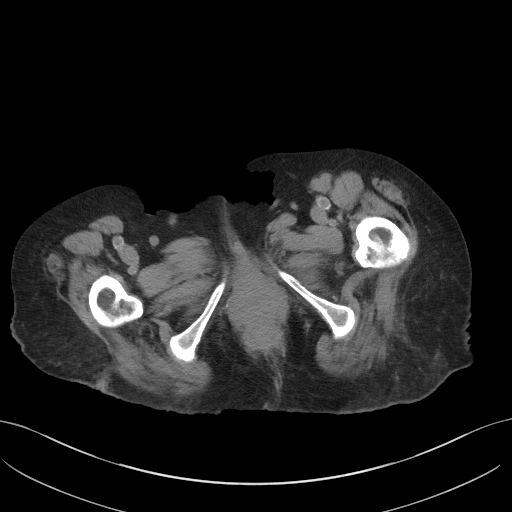
[im 7/85  bone]
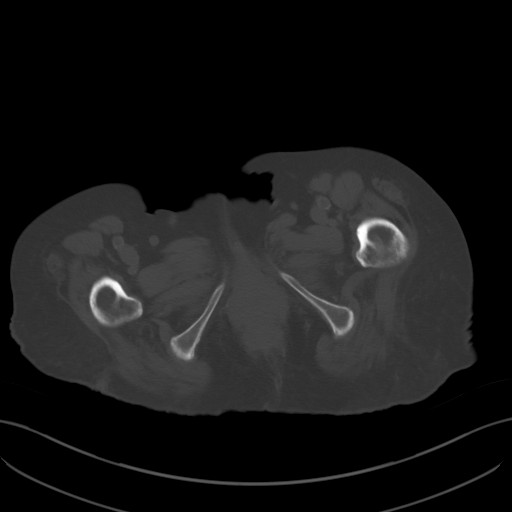
[im 14/85  soft-tissue]
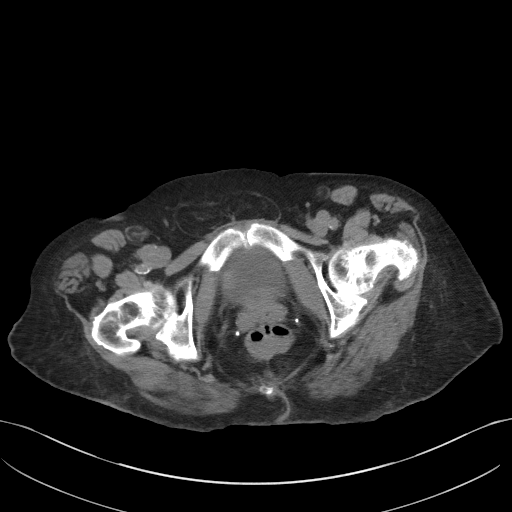
[im 21/85  soft-tissue]
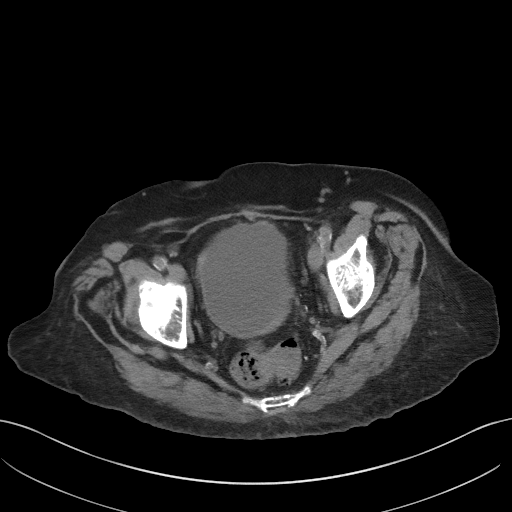
[im 27/85  soft-tissue]
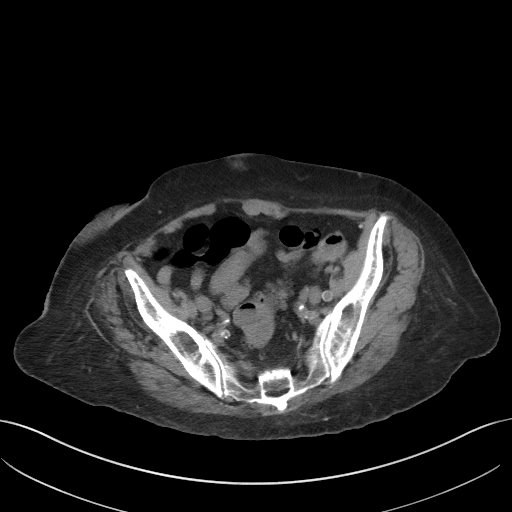
[im 34/85  soft-tissue]
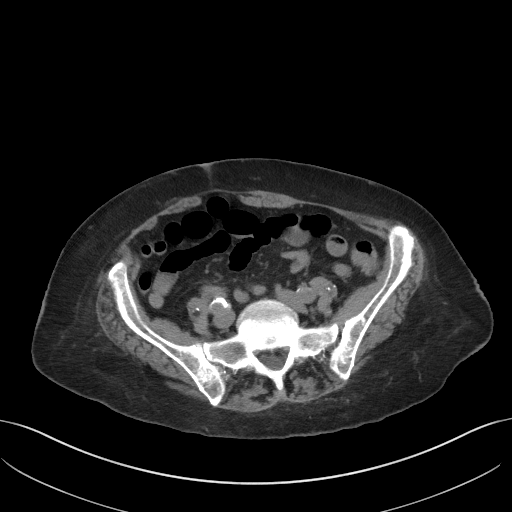
[im 41/85  soft-tissue]
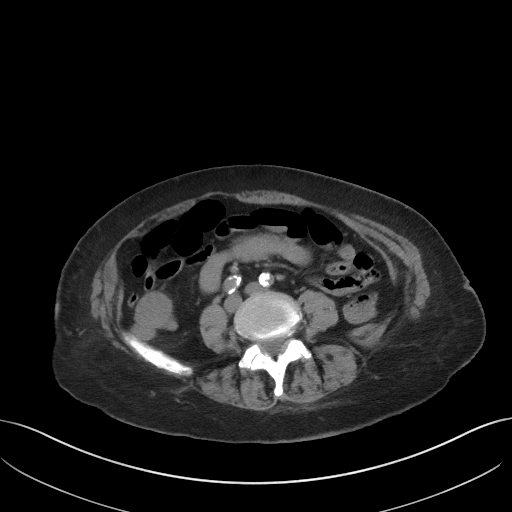
[im 48/85  soft-tissue]
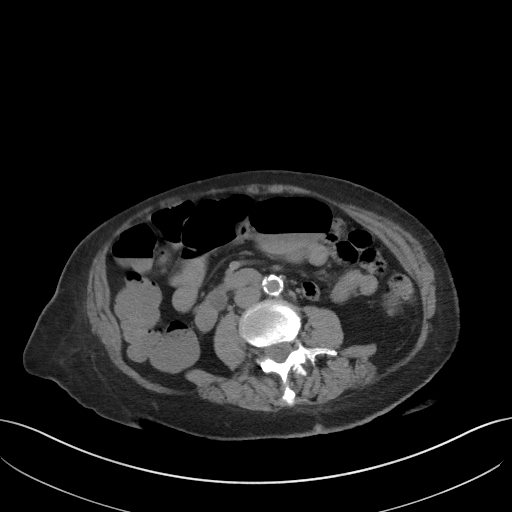
[im 54/85  soft-tissue]
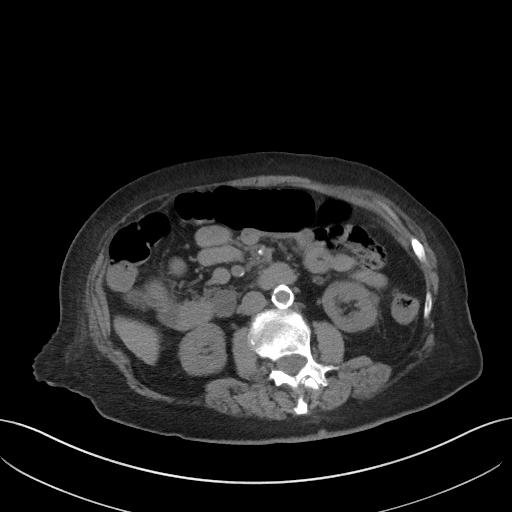
[im 61/85  soft-tissue]
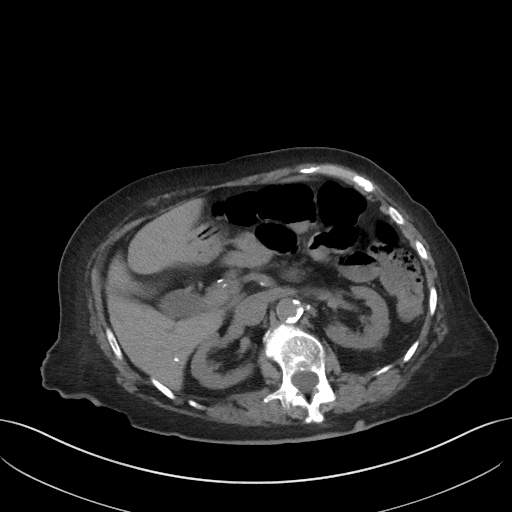
[im 61/85  bone]
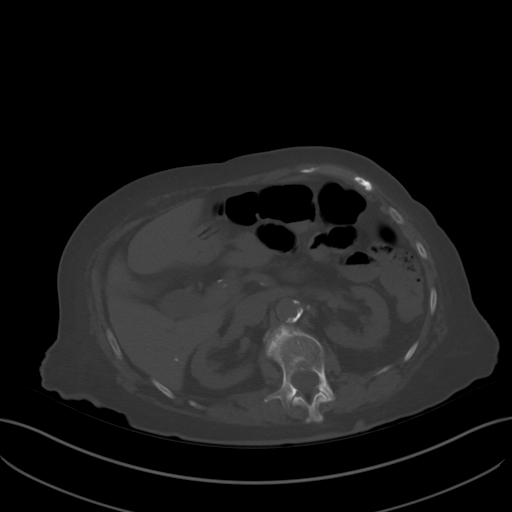
[im 68/85  soft-tissue]
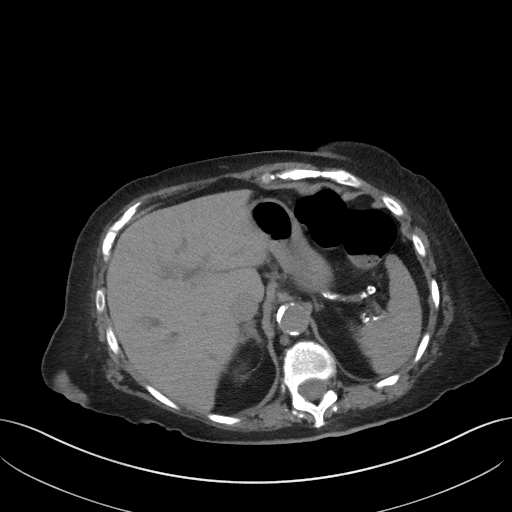
[im 74/85  soft-tissue]
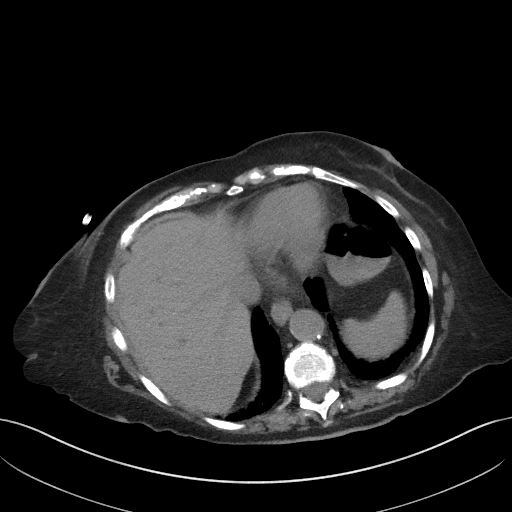
[im 81/85  soft-tissue]
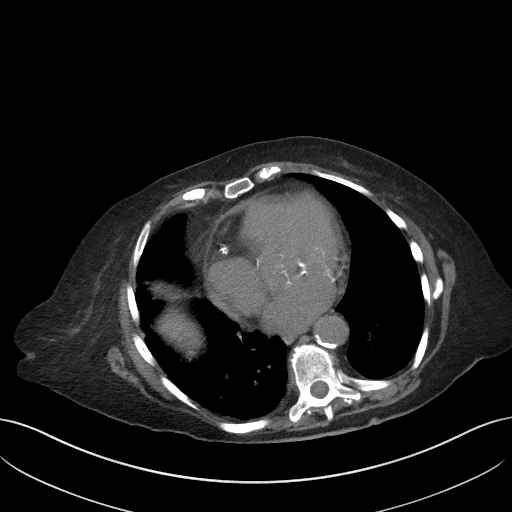

[Series 5: coronal st · coronal · 0.70mm/px · 3 of 71 slices shown]
[im 24/71  soft-tissue]
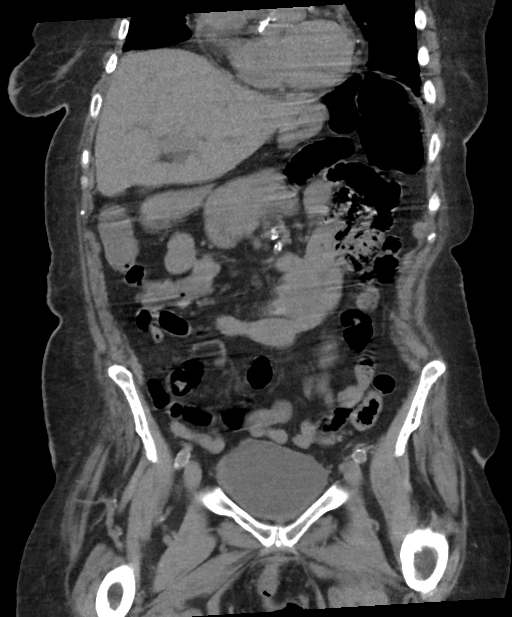
[im 32/71  soft-tissue]
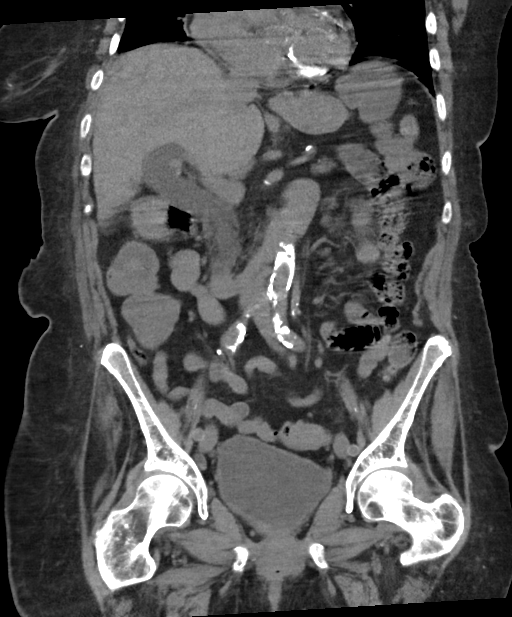
[im 39/71  soft-tissue]
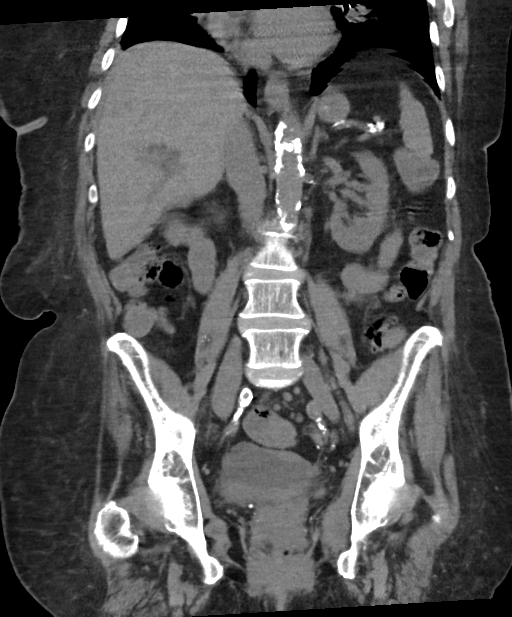

[15 of 46 positions shown; findings below may reference images not displayed]

FINDINGS: Lower chest: Partially imaged 3.7 x 4.8 cm masslike abnormality
along the ventral aspect of the heart, epicenter of the mass along
the pericardium. Dedicated CT of the chest for further evaluation is
recommended. This does not have the typical appearance of a
pericardial cyst or lipoma. Partially included lymphoma or possibly
mesothelioma among some other solid pathology that may predispose to
this area though not exclusive. This is not apparent on the
comparison exam. Heart is borderline enlarged with left main and 3
vessel coronary arteriosclerosis. There is aortic atherosclerosis.

Hepatobiliary: The unopacified liver demonstrates intrahepatic and
extrahepatic ductal dilatation (to 21 mm currently) which can be
seen in the prior setting of cholecystectomy. No
choledocholithiasis. The patient is status post cholecystectomy.
Calcified granuloma is noted in the right hepatic lobe.

Pancreas: Atrophic without focal mass or ductal dilatation.

Spleen: Normal

Adrenals/Urinary Tract: Normal bilateral adrenal glands. No
nephrolithiasis or hydronephrosis. Water attenuating 2.5 cm in
diameter cyst is seen in the lower pole the left kidney unchanged in
appearance. Smaller cysts are also noted of the left kidney as
before. No hydroureteronephrosis. Urinary bladder is physiologically
distended without calculus. Slight nodular thickening along the
dependent and nondependent walls of the urinary bladder are seen.
Direct visual correlation is recommended for further evaluation to
exclude a flat transitional cell lesion versus focal inflammatory
thickening.

Stomach/Bowel: Nondistended stomach with normal small bowel
rotation. Scattered colonic diverticulosis without acute
diverticulitis. No bowel obstruction. Status post appendectomy by
report

Vascular/Lymphatic: Moderate aortoiliac atherosclerosis without
aneurysm. No lymphadenopathy.

Reproductive: Uterus is surgically absent. No adnexal mass is noted.

Other: No free air nor free fluid.

Musculoskeletal: Mild degenerative disc disease of the thoracolumbar
juncture. No acute nor suspicious osseous lesions.
IMPRESSION: 1. A partially included, 3.7 x 4.8 cm solid appearing masslike
abnormality is noted along the ventral aspect of the heart along the
pericardium. This does not have the typical appearance of a more
common epicardial cyst or lipoma. Dedicated CT with IV contrast is
recommended for further correlation. More common solid pathology in
this region may include a mesothelioma or partial imaging of
lymphoma among some possibilities.
2. Left main and three-vessel coronary arteriosclerosis.
3. Chronic dilatation of the intrahepatic and extrahepatic biliary
system status post cholecystectomy. Choledocholithiasis.
4. Water attenuating cyst of the left lower pole of the kidney
measuring 2.5 cm.
5. Eccentric mural thickening of the bladder along the dependent
posterior wall. Direct visual correlation is recommended to exclude
a transitional cell lesion versus inflammatory thickening or
dependent debris.
6. Moderate aortoiliac atherosclerosis.
7. Colonic diverticulosis without acute diverticulitis.
